# Patient Record
Sex: Female | Born: 1940 | Race: Black or African American | Hispanic: No | Marital: Single | Smoking: Former smoker
Health system: Southern US, Community
[De-identification: ages and names within clinical notes are randomized; demographics above are authoritative.]

## PROBLEM LIST (undated history)

## (undated) DIAGNOSIS — E785 Hyperlipidemia, unspecified: Secondary | ICD-10-CM

## (undated) DIAGNOSIS — R5383 Other fatigue: Secondary | ICD-10-CM

## (undated) DIAGNOSIS — I712 Thoracic aortic aneurysm, without rupture, unspecified: Secondary | ICD-10-CM

## (undated) DIAGNOSIS — E079 Disorder of thyroid, unspecified: Secondary | ICD-10-CM

## (undated) DIAGNOSIS — D693 Immune thrombocytopenic purpura: Secondary | ICD-10-CM

## (undated) DIAGNOSIS — D696 Thrombocytopenia, unspecified: Secondary | ICD-10-CM

## (undated) DIAGNOSIS — B370 Candidal stomatitis: Secondary | ICD-10-CM

## (undated) DIAGNOSIS — N289 Disorder of kidney and ureter, unspecified: Secondary | ICD-10-CM

## (undated) DIAGNOSIS — Z860100 Personal history of colon polyps, unspecified: Secondary | ICD-10-CM

## (undated) DIAGNOSIS — F419 Anxiety disorder, unspecified: Secondary | ICD-10-CM

## (undated) DIAGNOSIS — M179 Osteoarthritis of knee, unspecified: Secondary | ICD-10-CM

## (undated) DIAGNOSIS — R300 Dysuria: Secondary | ICD-10-CM

## (undated) DIAGNOSIS — M1710 Unilateral primary osteoarthritis, unspecified knee: Secondary | ICD-10-CM

## (undated) DIAGNOSIS — B029 Zoster without complications: Secondary | ICD-10-CM

## (undated) DIAGNOSIS — R011 Cardiac murmur, unspecified: Secondary | ICD-10-CM

## (undated) DIAGNOSIS — Z8639 Personal history of other endocrine, nutritional and metabolic disease: Secondary | ICD-10-CM

## (undated) DIAGNOSIS — M549 Dorsalgia, unspecified: Secondary | ICD-10-CM

## (undated) DIAGNOSIS — N390 Urinary tract infection, site not specified: Principal | ICD-10-CM

## (undated) DIAGNOSIS — I10 Essential (primary) hypertension: Secondary | ICD-10-CM

## (undated) DIAGNOSIS — E876 Hypokalemia: Principal | ICD-10-CM

## (undated) DIAGNOSIS — K219 Gastro-esophageal reflux disease without esophagitis: Secondary | ICD-10-CM

## (undated) HISTORY — DX: Thoracic aortic aneurysm, without rupture: I71.2

## (undated) HISTORY — DX: Immune thrombocytopenic purpura: D69.3

## (undated) HISTORY — DX: Hyperlipidemia, unspecified: E78.5

## (undated) HISTORY — DX: Anxiety disorder, unspecified: F41.9

## (undated) HISTORY — PX: APPENDECTOMY: SHX54

## (undated) HISTORY — DX: Hypokalemia: E87.6

## (undated) HISTORY — DX: Personal history of colon polyps, unspecified: Z86.0100

## (undated) HISTORY — DX: Hypercalcemia: E83.52

## (undated) HISTORY — DX: Dorsalgia, unspecified: M54.9

## (undated) HISTORY — DX: Personal history of colonic polyps: Z86.010

## (undated) HISTORY — DX: Candidal stomatitis: B37.0

## (undated) HISTORY — PX: ABDOMINAL HYSTERECTOMY: SHX81

## (undated) HISTORY — DX: Osteoarthritis of knee, unspecified: M17.9

## (undated) HISTORY — DX: Cardiac murmur, unspecified: R01.1

## (undated) HISTORY — DX: Urinary tract infection, site not specified: N39.0

## (undated) HISTORY — DX: Thoracic aortic aneurysm, without rupture, unspecified: I71.20

## (undated) HISTORY — DX: Thrombocytopenia, unspecified: D69.6

## (undated) HISTORY — PX: TOTAL KNEE ARTHROPLASTY: SHX125

## (undated) HISTORY — DX: Gastro-esophageal reflux disease without esophagitis: K21.9

## (undated) HISTORY — DX: Disorder of kidney and ureter, unspecified: N28.9

## (undated) HISTORY — DX: Dysuria: R30.0

## (undated) HISTORY — DX: Other fatigue: R53.83

## (undated) HISTORY — DX: Personal history of other endocrine, nutritional and metabolic disease: Z86.39

## (undated) HISTORY — DX: Disorder of thyroid, unspecified: E07.9

## (undated) HISTORY — DX: Unilateral primary osteoarthritis, unspecified knee: M17.10

---

## 1999-11-28 ENCOUNTER — Ambulatory Visit (HOSPITAL_COMMUNITY): Admission: RE | Admit: 1999-11-28 | Discharge: 1999-11-28 | Payer: Self-pay | Admitting: Family Medicine

## 1999-11-29 ENCOUNTER — Ambulatory Visit (HOSPITAL_COMMUNITY): Admission: RE | Admit: 1999-11-29 | Discharge: 1999-11-29 | Payer: Self-pay | Admitting: Family Medicine

## 2003-03-09 ENCOUNTER — Encounter: Payer: Self-pay | Admitting: Family Medicine

## 2003-03-09 ENCOUNTER — Ambulatory Visit (HOSPITAL_COMMUNITY): Admission: RE | Admit: 2003-03-09 | Discharge: 2003-03-09 | Payer: Self-pay | Admitting: Family Medicine

## 2003-08-01 ENCOUNTER — Encounter: Payer: Self-pay | Admitting: Occupational Therapy

## 2003-08-01 ENCOUNTER — Ambulatory Visit (HOSPITAL_COMMUNITY): Admission: RE | Admit: 2003-08-01 | Discharge: 2003-08-01 | Payer: Self-pay | Admitting: Family Medicine

## 2003-08-19 ENCOUNTER — Encounter: Payer: Self-pay | Admitting: Family Medicine

## 2003-08-19 ENCOUNTER — Encounter (INDEPENDENT_AMBULATORY_CARE_PROVIDER_SITE_OTHER): Payer: Self-pay | Admitting: *Deleted

## 2003-08-19 ENCOUNTER — Encounter: Admission: RE | Admit: 2003-08-19 | Discharge: 2003-08-19 | Payer: Self-pay | Admitting: Family Medicine

## 2003-10-07 ENCOUNTER — Encounter: Admission: RE | Admit: 2003-10-07 | Discharge: 2003-10-07 | Payer: Self-pay | Admitting: Family Medicine

## 2004-07-13 ENCOUNTER — Ambulatory Visit: Payer: Self-pay | Admitting: Family Medicine

## 2004-07-20 ENCOUNTER — Ambulatory Visit: Payer: Self-pay | Admitting: *Deleted

## 2004-07-26 ENCOUNTER — Ambulatory Visit: Payer: Self-pay | Admitting: *Deleted

## 2004-09-24 ENCOUNTER — Ambulatory Visit: Payer: Self-pay | Admitting: Family Medicine

## 2004-10-15 ENCOUNTER — Ambulatory Visit: Payer: Self-pay | Admitting: Family Medicine

## 2004-11-08 ENCOUNTER — Ambulatory Visit: Payer: Self-pay | Admitting: Family Medicine

## 2004-12-24 ENCOUNTER — Ambulatory Visit: Payer: Self-pay | Admitting: Family Medicine

## 2005-01-07 ENCOUNTER — Ambulatory Visit: Payer: Self-pay | Admitting: Family Medicine

## 2005-02-19 ENCOUNTER — Ambulatory Visit: Payer: Self-pay | Admitting: Family Medicine

## 2005-06-06 ENCOUNTER — Ambulatory Visit: Payer: Self-pay | Admitting: Family Medicine

## 2005-06-12 ENCOUNTER — Ambulatory Visit: Payer: Self-pay | Admitting: Family Medicine

## 2005-06-13 ENCOUNTER — Ambulatory Visit (HOSPITAL_COMMUNITY): Admission: RE | Admit: 2005-06-13 | Discharge: 2005-06-13 | Payer: Self-pay | Admitting: Family Medicine

## 2005-08-27 ENCOUNTER — Ambulatory Visit (HOSPITAL_COMMUNITY): Admission: RE | Admit: 2005-08-27 | Discharge: 2005-08-27 | Payer: Self-pay | Admitting: Nephrology

## 2005-08-27 ENCOUNTER — Encounter (INDEPENDENT_AMBULATORY_CARE_PROVIDER_SITE_OTHER): Payer: Self-pay | Admitting: Cardiology

## 2005-08-30 ENCOUNTER — Ambulatory Visit: Payer: Self-pay | Admitting: Family Medicine

## 2005-09-25 ENCOUNTER — Encounter (HOSPITAL_COMMUNITY): Admission: RE | Admit: 2005-09-25 | Discharge: 2005-12-24 | Payer: Self-pay | Admitting: Nephrology

## 2005-10-08 ENCOUNTER — Encounter: Admission: RE | Admit: 2005-10-08 | Discharge: 2005-10-08 | Payer: Self-pay | Admitting: Nephrology

## 2005-11-05 ENCOUNTER — Encounter: Admission: RE | Admit: 2005-11-05 | Discharge: 2005-11-05 | Payer: Self-pay | Admitting: Nephrology

## 2005-11-11 ENCOUNTER — Encounter (INDEPENDENT_AMBULATORY_CARE_PROVIDER_SITE_OTHER): Payer: Self-pay | Admitting: Family Medicine

## 2005-11-16 ENCOUNTER — Emergency Department (HOSPITAL_COMMUNITY): Admission: EM | Admit: 2005-11-16 | Discharge: 2005-11-16 | Payer: Self-pay | Admitting: Family Medicine

## 2005-11-17 ENCOUNTER — Emergency Department (HOSPITAL_COMMUNITY): Admission: EM | Admit: 2005-11-17 | Discharge: 2005-11-17 | Payer: Self-pay | Admitting: Emergency Medicine

## 2005-11-21 ENCOUNTER — Encounter: Admission: RE | Admit: 2005-11-21 | Discharge: 2005-11-21 | Payer: Self-pay | Admitting: Nephrology

## 2006-01-10 ENCOUNTER — Ambulatory Visit: Payer: Self-pay | Admitting: Internal Medicine

## 2006-01-24 ENCOUNTER — Ambulatory Visit: Payer: Self-pay | Admitting: Internal Medicine

## 2006-02-07 ENCOUNTER — Ambulatory Visit: Payer: Self-pay | Admitting: Internal Medicine

## 2006-03-18 ENCOUNTER — Ambulatory Visit: Payer: Self-pay | Admitting: Internal Medicine

## 2006-03-25 ENCOUNTER — Ambulatory Visit (HOSPITAL_COMMUNITY): Admission: RE | Admit: 2006-03-25 | Discharge: 2006-03-25 | Payer: Self-pay | Admitting: Internal Medicine

## 2006-04-18 ENCOUNTER — Ambulatory Visit: Payer: Self-pay | Admitting: Internal Medicine

## 2006-04-21 ENCOUNTER — Encounter: Admission: RE | Admit: 2006-04-21 | Discharge: 2006-04-21 | Payer: Self-pay | Admitting: Nephrology

## 2006-06-06 ENCOUNTER — Encounter: Admission: RE | Admit: 2006-06-06 | Discharge: 2006-06-06 | Payer: Self-pay | Admitting: Nephrology

## 2006-07-22 ENCOUNTER — Ambulatory Visit: Payer: Self-pay | Admitting: Gastroenterology

## 2006-08-07 ENCOUNTER — Ambulatory Visit: Payer: Self-pay | Admitting: Emergency Medicine

## 2006-08-18 ENCOUNTER — Ambulatory Visit: Payer: Self-pay | Admitting: Cardiovascular Disease

## 2006-08-26 ENCOUNTER — Ambulatory Visit: Payer: Self-pay | Admitting: Gastroenterology

## 2006-08-26 ENCOUNTER — Encounter (INDEPENDENT_AMBULATORY_CARE_PROVIDER_SITE_OTHER): Payer: Self-pay | Admitting: Gastroenterology

## 2006-09-01 ENCOUNTER — Ambulatory Visit: Payer: Self-pay | Admitting: Emergency Medicine

## 2006-09-03 ENCOUNTER — Ambulatory Visit: Payer: Self-pay | Admitting: Emergency Medicine

## 2006-11-25 ENCOUNTER — Encounter: Admission: RE | Admit: 2006-11-25 | Discharge: 2006-11-25 | Payer: Self-pay | Admitting: Nephrology

## 2006-12-16 ENCOUNTER — Encounter: Admission: RE | Admit: 2006-12-16 | Discharge: 2006-12-16 | Payer: Self-pay | Admitting: Endocrinology

## 2007-01-06 ENCOUNTER — Encounter: Admission: RE | Admit: 2007-01-06 | Discharge: 2007-01-06 | Payer: Self-pay | Admitting: Endocrinology

## 2007-02-17 ENCOUNTER — Ambulatory Visit: Payer: Self-pay | Admitting: Cardiology

## 2007-02-20 ENCOUNTER — Ambulatory Visit: Payer: Self-pay | Admitting: Emergency Medicine

## 2007-03-04 ENCOUNTER — Encounter: Admission: RE | Admit: 2007-03-04 | Discharge: 2007-03-04 | Payer: Self-pay | Admitting: Nephrology

## 2007-03-11 ENCOUNTER — Encounter: Admission: RE | Admit: 2007-03-11 | Discharge: 2007-03-11 | Payer: Self-pay | Admitting: Emergency Medicine

## 2007-03-11 ENCOUNTER — Encounter (INDEPENDENT_AMBULATORY_CARE_PROVIDER_SITE_OTHER): Payer: Self-pay | Admitting: *Deleted

## 2007-03-13 ENCOUNTER — Encounter: Admission: RE | Admit: 2007-03-13 | Discharge: 2007-03-13 | Payer: Self-pay | Admitting: Emergency Medicine

## 2007-03-13 ENCOUNTER — Encounter (INDEPENDENT_AMBULATORY_CARE_PROVIDER_SITE_OTHER): Payer: Self-pay | Admitting: Specialist

## 2007-03-25 ENCOUNTER — Ambulatory Visit: Payer: Self-pay | Admitting: Emergency Medicine

## 2007-04-20 ENCOUNTER — Ambulatory Visit: Payer: Self-pay | Admitting: Family Medicine

## 2007-06-09 ENCOUNTER — Ambulatory Visit: Payer: Self-pay | Admitting: Emergency Medicine

## 2007-07-06 ENCOUNTER — Telehealth (INDEPENDENT_AMBULATORY_CARE_PROVIDER_SITE_OTHER): Payer: Self-pay | Admitting: Family Medicine

## 2007-07-09 ENCOUNTER — Encounter (INDEPENDENT_AMBULATORY_CARE_PROVIDER_SITE_OTHER): Payer: Self-pay | Admitting: Family Medicine

## 2007-07-09 DIAGNOSIS — J309 Allergic rhinitis, unspecified: Secondary | ICD-10-CM | POA: Insufficient documentation

## 2007-07-09 DIAGNOSIS — M549 Dorsalgia, unspecified: Secondary | ICD-10-CM

## 2007-07-09 DIAGNOSIS — E039 Hypothyroidism, unspecified: Secondary | ICD-10-CM | POA: Insufficient documentation

## 2007-07-09 DIAGNOSIS — Z8679 Personal history of other diseases of the circulatory system: Secondary | ICD-10-CM | POA: Insufficient documentation

## 2007-07-09 DIAGNOSIS — IMO0002 Reserved for concepts with insufficient information to code with codable children: Secondary | ICD-10-CM | POA: Insufficient documentation

## 2007-07-09 DIAGNOSIS — J479 Bronchiectasis, uncomplicated: Secondary | ICD-10-CM

## 2007-07-09 DIAGNOSIS — E785 Hyperlipidemia, unspecified: Secondary | ICD-10-CM

## 2007-07-09 DIAGNOSIS — Z8639 Personal history of other endocrine, nutritional and metabolic disease: Secondary | ICD-10-CM | POA: Insufficient documentation

## 2007-07-09 DIAGNOSIS — N259 Disorder resulting from impaired renal tubular function, unspecified: Secondary | ICD-10-CM | POA: Insufficient documentation

## 2007-07-09 DIAGNOSIS — M1710 Unilateral primary osteoarthritis, unspecified knee: Secondary | ICD-10-CM

## 2007-07-09 DIAGNOSIS — I10 Essential (primary) hypertension: Secondary | ICD-10-CM

## 2007-07-09 DIAGNOSIS — Z862 Personal history of diseases of the blood and blood-forming organs and certain disorders involving the immune mechanism: Secondary | ICD-10-CM

## 2007-07-14 DIAGNOSIS — F411 Generalized anxiety disorder: Secondary | ICD-10-CM | POA: Insufficient documentation

## 2007-07-23 ENCOUNTER — Encounter (INDEPENDENT_AMBULATORY_CARE_PROVIDER_SITE_OTHER): Payer: Self-pay | Admitting: Family Medicine

## 2007-08-18 ENCOUNTER — Encounter (INDEPENDENT_AMBULATORY_CARE_PROVIDER_SITE_OTHER): Payer: Self-pay | Admitting: Family Medicine

## 2007-08-21 ENCOUNTER — Ambulatory Visit: Payer: Self-pay | Admitting: Cardiology

## 2007-09-01 ENCOUNTER — Encounter (INDEPENDENT_AMBULATORY_CARE_PROVIDER_SITE_OTHER): Payer: Self-pay | Admitting: Family Medicine

## 2007-09-02 DIAGNOSIS — K589 Irritable bowel syndrome without diarrhea: Secondary | ICD-10-CM

## 2007-09-02 DIAGNOSIS — K219 Gastro-esophageal reflux disease without esophagitis: Secondary | ICD-10-CM

## 2007-09-02 DIAGNOSIS — J984 Other disorders of lung: Secondary | ICD-10-CM | POA: Insufficient documentation

## 2007-10-12 HISTORY — PX: KNEE ARTHROPLASTY: SHX992

## 2007-10-13 ENCOUNTER — Emergency Department (HOSPITAL_COMMUNITY): Admission: EM | Admit: 2007-10-13 | Discharge: 2007-10-13 | Payer: Self-pay | Admitting: Emergency Medicine

## 2007-10-15 ENCOUNTER — Inpatient Hospital Stay (HOSPITAL_COMMUNITY): Admission: RE | Admit: 2007-10-15 | Discharge: 2007-10-19 | Payer: Self-pay | Admitting: Orthopedic Surgery

## 2007-12-10 ENCOUNTER — Encounter: Admission: RE | Admit: 2007-12-10 | Discharge: 2008-03-08 | Payer: Self-pay | Admitting: Orthopedic Surgery

## 2007-12-18 ENCOUNTER — Encounter: Payer: Self-pay | Admitting: Emergency Medicine

## 2008-02-22 ENCOUNTER — Encounter (INDEPENDENT_AMBULATORY_CARE_PROVIDER_SITE_OTHER): Payer: Self-pay | Admitting: Family Medicine

## 2008-09-12 ENCOUNTER — Ambulatory Visit (HOSPITAL_COMMUNITY): Admission: RE | Admit: 2008-09-12 | Discharge: 2008-09-12 | Payer: Self-pay | Admitting: Cardiovascular Disease

## 2008-12-02 ENCOUNTER — Encounter: Admission: RE | Admit: 2008-12-02 | Discharge: 2008-12-02 | Payer: Self-pay | Admitting: Sports Medicine

## 2008-12-13 ENCOUNTER — Encounter (INDEPENDENT_AMBULATORY_CARE_PROVIDER_SITE_OTHER): Payer: Self-pay | Admitting: Family Medicine

## 2008-12-18 DIAGNOSIS — Z8719 Personal history of other diseases of the digestive system: Secondary | ICD-10-CM

## 2008-12-20 ENCOUNTER — Encounter (INDEPENDENT_AMBULATORY_CARE_PROVIDER_SITE_OTHER): Payer: Self-pay | Admitting: Family Medicine

## 2008-12-20 ENCOUNTER — Inpatient Hospital Stay (HOSPITAL_COMMUNITY): Admission: EM | Admit: 2008-12-20 | Discharge: 2008-12-20 | Payer: Self-pay | Admitting: Emergency Medicine

## 2009-01-09 ENCOUNTER — Ambulatory Visit: Payer: Self-pay | Admitting: Family Medicine

## 2009-01-09 LAB — CONVERTED CEMR LAB
BUN: 19 mg/dL (ref 6–23)
Calcium: 9.9 mg/dL (ref 8.4–10.5)
Glucose, Bld: 79 mg/dL (ref 70–99)
Magnesium: 1.7 mg/dL (ref 1.5–2.5)

## 2009-01-29 ENCOUNTER — Encounter (INDEPENDENT_AMBULATORY_CARE_PROVIDER_SITE_OTHER): Payer: Self-pay | Admitting: Family Medicine

## 2009-03-08 ENCOUNTER — Encounter: Admission: RE | Admit: 2009-03-08 | Discharge: 2009-03-08 | Payer: Self-pay | Admitting: Endocrinology

## 2009-07-07 ENCOUNTER — Encounter: Payer: Self-pay | Admitting: Emergency Medicine

## 2009-08-01 ENCOUNTER — Encounter (INDEPENDENT_AMBULATORY_CARE_PROVIDER_SITE_OTHER): Payer: Self-pay | Admitting: *Deleted

## 2009-10-25 ENCOUNTER — Ambulatory Visit: Payer: Self-pay | Admitting: Physician Assistant

## 2009-10-25 LAB — CONVERTED CEMR LAB
ALT: 10 units/L (ref 0–35)
AST: 14 units/L (ref 0–37)
Creatinine, Ser: 1.08 mg/dL (ref 0.40–1.20)
Total Bilirubin: 0.4 mg/dL (ref 0.3–1.2)

## 2009-10-26 ENCOUNTER — Encounter: Payer: Self-pay | Admitting: Physician Assistant

## 2009-11-07 ENCOUNTER — Encounter: Payer: Self-pay | Admitting: Physician Assistant

## 2009-12-26 ENCOUNTER — Encounter: Payer: Self-pay | Admitting: Physician Assistant

## 2010-01-07 ENCOUNTER — Encounter: Payer: Self-pay | Admitting: Physician Assistant

## 2010-03-14 ENCOUNTER — Encounter: Payer: Self-pay | Admitting: Physician Assistant

## 2010-03-14 ENCOUNTER — Encounter: Payer: Self-pay | Admitting: Emergency Medicine

## 2010-03-14 LAB — CONVERTED CEMR LAB
ALT: 10 units/L
Albumin: 4 g/dL
Alkaline Phosphatase: 123 units/L
Basophils Relative: 1 %
CO2: 32 meq/L
Chloride: 99 meq/L
Creatinine, Ser: 1.06 mg/dL
HCT: 30.5 %
Hemoglobin: 10.2 g/dL
MCV: 75 fL
Neutrophils Relative %: 53 %
Platelets: 308 10*3/uL
Potassium: 2.8 meq/L
Total Protein: 6.9 g/dL
WBC: 6.4 10*3/uL

## 2010-03-22 ENCOUNTER — Ambulatory Visit: Payer: Self-pay | Admitting: Emergency Medicine

## 2010-03-26 ENCOUNTER — Ambulatory Visit: Payer: Self-pay | Admitting: Cardiovascular Disease

## 2010-04-02 ENCOUNTER — Encounter: Payer: Self-pay | Admitting: Physician Assistant

## 2010-04-02 DIAGNOSIS — D649 Anemia, unspecified: Secondary | ICD-10-CM

## 2010-04-05 ENCOUNTER — Encounter (INDEPENDENT_AMBULATORY_CARE_PROVIDER_SITE_OTHER): Payer: Self-pay | Admitting: *Deleted

## 2010-04-06 ENCOUNTER — Ambulatory Visit: Payer: Self-pay | Admitting: Emergency Medicine

## 2010-04-06 DIAGNOSIS — R05 Cough: Secondary | ICD-10-CM

## 2010-05-07 ENCOUNTER — Encounter (INDEPENDENT_AMBULATORY_CARE_PROVIDER_SITE_OTHER): Payer: Self-pay | Admitting: *Deleted

## 2010-06-04 ENCOUNTER — Encounter (INDEPENDENT_AMBULATORY_CARE_PROVIDER_SITE_OTHER): Payer: Self-pay | Admitting: *Deleted

## 2010-06-06 ENCOUNTER — Ambulatory Visit: Payer: Self-pay | Admitting: Gastroenterology

## 2010-06-13 ENCOUNTER — Ambulatory Visit: Payer: Self-pay | Admitting: Gastroenterology

## 2010-06-15 ENCOUNTER — Encounter: Payer: Self-pay | Admitting: Gastroenterology

## 2010-06-19 ENCOUNTER — Telehealth: Payer: Self-pay | Admitting: Gastroenterology

## 2010-06-19 ENCOUNTER — Encounter: Payer: Self-pay | Admitting: Physician Assistant

## 2010-06-19 DIAGNOSIS — Z860100 Personal history of colon polyps, unspecified: Secondary | ICD-10-CM | POA: Insufficient documentation

## 2010-10-02 ENCOUNTER — Encounter: Admission: RE | Admit: 2010-10-02 | Discharge: 2010-10-02 | Payer: Self-pay | Admitting: Internal Medicine

## 2010-12-02 ENCOUNTER — Encounter: Payer: Self-pay | Admitting: Family Medicine

## 2010-12-02 ENCOUNTER — Encounter: Payer: Self-pay | Admitting: Emergency Medicine

## 2010-12-02 ENCOUNTER — Encounter: Payer: Self-pay | Admitting: Endocrinology

## 2010-12-10 ENCOUNTER — Other Ambulatory Visit (HOSPITAL_COMMUNITY): Payer: Self-pay | Admitting: Cardiovascular Disease

## 2010-12-10 DIAGNOSIS — I729 Aneurysm of unspecified site: Secondary | ICD-10-CM

## 2010-12-11 NOTE — Miscellaneous (Signed)
Summary: Nephrology Notes   Rec'd notes from Dr. Marland Mcalpine office. She is anemic.  Labs indicate she is to see GI for eval soon. Make sure she has appointment with GI Have her come in for repeat CBC in 2 weeks with an anemia panel (Iron, TIBC, Ferritin, etc.) Tereso Newcomer PA-C  Apr 02, 2010 11:42 PM   Left message on answering machine for pt to call back.Marland KitchenMarland KitchenMarland KitchenArmenia Shannon  Apr 03, 2010 12:27 PM  Left message on answering machine for pt to call back.Marland KitchenMarland KitchenMarland KitchenArmenia Shannon  Apr 04, 2010 11:39 AM  Left message on answering machine for pt to call back....will mail letter.... [name]  Apr 05, 2010 11:10 AM   Clinical Lists Changes  Problems: Added new problem of ANEMIA (ICD-285.9) - Signed Assessed ANEMIA as comment only - Hg 10.2 with low MCV nephrologist referred to GI will get repeat labs with anemia panel  - Signed Observations: Added new observation of PO4: 2.7 mg/dL (85/46/2703 50:09) Added new observation of CALCIUM: 9.8 mg/dL (38/18/2993 71:69) Added new observation of ALBUMIN: 4.0 g/dL (67/89/3810 17:51) Added new observation of PROTEIN, TOT: 6.9 g/dL (02/58/5277 82:42) Added new observation of SGPT (ALT): 10 units/L (03/14/2010 23:35) Added new observation of SGOT (AST): 14 units/L (03/14/2010 23:35) Added new observation of ALK PHOS: 123 units/L (03/14/2010 23:35) Added new observation of BILI TOTAL: 0.2 mg/dL (35/36/1443 15:40) Added new observation of GFRAA: 62 mL/min (03/14/2010 23:35) Added new observation of GFR: 54 mL/min (03/14/2010 23:35) Added new observation of CREATININE: 1.06 mg/dL (08/67/6195 09:32) Added new observation of BUN: 18 mg/dL (67/10/4579 99:83) Added new observation of BG RANDOM: 78 mg/dL (38/25/0539 76:73) Added new observation of CO2 PLSM/SER: 32 meq/L (03/14/2010 23:35) Added new observation of CL SERUM: 99 meq/L (03/14/2010 23:35) Added new observation of K SERUM: 2.8 meq/L (03/14/2010 23:35) Added new observation of NA: 140 meq/L (03/14/2010  23:35) Added new observation of BASOPHIL %: 1 % (03/14/2010 23:35) Added new observation of % EOS AUTO: 2 % (03/14/2010 23:35) Added new observation of MONOCYTE %: 7 % (03/14/2010 23:35) Added new observation of LYMPHS %: 37 % (03/14/2010 23:35) Added new observation of PMN %: 53 % (03/14/2010 23:35) Added new observation of PLATELETK/UL: 308 K/uL (03/14/2010 23:35) Added new observation of RDW: 17.6 % (03/14/2010 23:35) Added new observation of MCV: 75 fL (03/14/2010 23:35) Added new observation of HCT: 30.5 % (03/14/2010 23:35) Added new observation of HGB: 10.2 g/dL (41/93/7902 40:97) Added new observation of RBC M/UL: 4.08 M/uL (03/14/2010 23:35) Added new observation of WBC COUNT: 6.4 10*3/microliter (03/14/2010 23:35)       Impression & Recommendations:  Problem # 1:  ANEMIA (ICD-285.9) Hg 10.2 with low MCV nephrologist referred to GI will get repeat labs with anemia panel  Complete Medication List: 1)  Paxil Cr 25 Mg Xr24h-tab (Paroxetine hcl) .... Take 1 tablet by mouth once a day (dr. Hyman Hopes) 2)  Adult Aspirin Low Strength 81 Mg Tbdp (Aspirin) .... Take 1 tablet by mouth once a day 3)  Lovastatin 40 Mg Tabs (Lovastatin) .... Take 1 tablet by mouth once a day 4)  Levothroid 112 Mcg Tabs (Levothyroxine sodium) .... Take 1 tablet by mouth once a day (dr. Talmage Nap) 5)  Claritin 10 Mg Tabs (Loratadine) .... Take 1 tablet by mouth once a day as needed for allergies 6)  Flonase 50 Mcg/act Susp (Fluticasone propionate) .Marland Kitchen.. 1 squirt qnostril bid 7)  Micardis Hct 80-12.5 Mg Tabs (Telmisartan-hctz) .... Take one tab by mouth daily 8)  Omeprazole 20 Mg  Cpdr (Omeprazole) .... Take one capsule by mouth every day ([facility]) 9)  Tylenol Extra Strength 500 Mg Tabs (Acetaminophen) .Marland Kitchen.. 1-2 by mouth three times a day as needed 10)  Senokot 8.6 Mg Tabs (Sennosides) .... As needed for constipation 11)  Calcium 600 600 Mg Tabs (Calcium carbonate) .... Two times a day 12)  Vitamin D 400 Unit  Caps (Cholecalciferol) .... Two times a day

## 2010-12-11 NOTE — Progress Notes (Signed)
Summary: triage   Phone Note Call from Patient Call back at Home Phone [phone]   Caller: Patient Call For: Dr Arlyce Dice Reason for Call: Talk to Nurse Summary of Call: Patient wants to speak to nurse because she had a procedure last week and has not had a bm yet. Initial call taken by: Tawni Levy,  June 19, 2010 10:48 AM  Follow-up for Phone Call        Had Colon 06-13-10. Pt. is passing gas, but no BM since procedure. Pt. is eating normally and drinking fluids. Denies abd.pain, bleeding, fever.   1) Continue eating/drinking as usual. follow routine actvity schedule. 2) May eat prunes as desired 3) If no BM by Thursday please call back.  4) For any pain, bleeding, fever, etc. call immediately.  Follow-up by: Laureen Ochs LPN,  June 19, 2010 11:02 AM     Appended Document: triage Pt. requested another coupon for Movi prep.  I mailed one to her.

## 2010-12-11 NOTE — Miscellaneous (Signed)
Summary: [facility] Previsit/prep  Clinical Lists Changes  Medications: Added new medication of MOVIPREP 100 GM  SOLR (PEG-KCL-NACL-NASULF-NA ASC-C) As per prep instructions. - Signed Rx of MOVIPREP 100 GM  SOLR (PEG-KCL-NACL-NASULF-NA ASC-C) As per prep instructions.;  #1 x 0;  Signed;  Entered by: Wyona Almas RN;  Authorized by: Louis Meckel MD;  Method used: Electronically to [facility] [address] [phone]*, [address] [place], [place], [place]  [address], Ph: [phone] or [phone], Fax: [phone] Allergies: Changed allergy or adverse reaction from CODEINE to CODEINE Changed allergy or adverse reaction from PCN to PCN Changed allergy or adverse reaction from ULTRAM to ULTRAM Changed allergy or adverse reaction from * SULFA to * SULFA    Prescriptions: MOVIPREP 100 GM  SOLR (PEG-KCL-NACL-NASULF-NA ASC-C) As per prep instructions.  #1 x 0   Entered by:   Wyona Almas RN   Authorized by:   Louis Meckel MD   Signed by:   Wyona Almas RN on 06/06/2010   Method used:   [facility]ronically to        [facility] [address] [phone]* (retail)       [address]       [place], [place]  [address]       Ph: [phone] or [phone]       Fax: [phone]   RxID:   [id number]

## 2010-12-11 NOTE — Assessment & Plan Note (Signed)
Summary: cough, bronchiectasis   Visit Type:  Follow-up Primary Provider/Referring Provider:  Tereso Newcomer, PA-C  CC:  Bronchiectasis.  Discuss CT chest.  The patient says her cough is better. Mucus production is less and mostly clear.Marland Kitchen  History of Present Illness: [name] is a 70 year old woman with a history of hypertension, hypercholesterolemia, hypothyroidism, allergic rhinitis, and renal insufficiency followed by Dr. Elvis Coil. I saw her in 2007-8 for pulmonary nodules, mediastinal LAD and basilar bronchiectasis on CT scan. Bx of an axillary node showed no evidence for sarcoidosis.   Re-establishing care 03/22/10 -- Had an URI in Feb-March 2011, also some allergies. Used claritin, currently off as her symptoms improved. Still feeling a bit fatigued - being followed by Dr Hyman Hopes for anemia and hypokalemia, planning to GI eval for anemia. Reports continued globus sensation. Clears her throat and prod of clear to yellow. Has seen a streak of red in it before. No real cough,  just throat clearing.   ROV 04/06/10 -- returns to discuss her bronchiectasis and cough. We started claritin last time, didn't need the flonase. Cough is better, globus sensation is better but not gone completely. Seems to be resolving. Reviewed CT scan of the chest - no change in bronchiectasis.   cc: [name], [name]  Current Medications (verified): 1)  Paxil Cr 25 Mg Xr24h-Tab (Paroxetine Hcl) .... Take 1 Tablet By Mouth Once A Day (Dr. Hyman Hopes) 2)  Adult Aspirin Low Strength 81 Mg  Tbdp (Aspirin) .... Take 1 Tablet By Mouth Once A Day 3)  Lovastatin 40 Mg  Tabs (Lovastatin) .... Take 1 Tablet By Mouth Once A Day 4)  Levothroid 100 Mcg Tabs (Levothyroxine Sodium) .Marland Kitchen.. 1 By Mouth Daily 5)  Claritin 10 Mg  Tabs (Loratadine) .... Take 1 Tablet By Mouth Once A Day As Needed For Allergies 6)  Flonase 50 Mcg/act  Susp (Fluticasone Propionate) .Marland Kitchen.. 1 Squirt Qnostril Bid 7)  Micardis Hct 80-12.5 Mg Tabs  (Telmisartan-Hctz) .... Take One Tab By Mouth Daily 8)  Omeprazole 20 Mg Cpdr (Omeprazole) .... Take One Capsule By Mouth Every Day ([facility]) 9)  Tylenol 325 Mg Tabs (Acetaminophen) .Marland Kitchen.. 1-2 By Mouth Three Times A Day As Needed 10)  Senokot 8.6 Mg Tabs (Sennosides) .... As Needed For Constipation 11)  Calcium 600 600 Mg Tabs (Calcium Carbonate) .... Two Times A Day 12)  Vitamin D 400 Unit Caps (Cholecalciferol) .... Two Times A Day  Allergies (verified): 1)  ! Codeine 2)  ! Pcn 3)  ! Ultram 4)  ! Nasacort Aq (Triamcinolone Acetonide(Nasal)) 5)  ! * Sulfa  Vital Signs:  Patient profile:   70 year old female Height:      66 inches (167.64 cm) Weight:      195 pounds (88.64 kg) BMI:     31.59 O2 Sat:      95 % on Room air Temp:     98.5 degrees F (36.94 degrees C) oral Pulse rate:   89 / minute BP sitting:   126 / 70  (right arm) Cuff size:   regular  Vitals Entered By: Michel Bickers CMA (Apr 06, 2010 11:26 AM)  O2 Sat at Rest %:  95 O2 Flow:  Room air CC: Bronchiectasis.  Discuss CT chest.  The patient says her cough is better. Mucus production is less and mostly clear. Comments Medications reviewed. Daytime phone verified. Michel Bickers CMA  Apr 06, 2010 11:32 AM   Physical Exam  General:  normal appearance  and healthy appearing.   Head:  normocephalic and atraumatic Eyes:  conjunctiva and sclera clear Nose:  no deformity, discharge, inflammation, or lesions Mouth:  no deformity or lesions Neck:  no masses, thyromegaly, or abnormal cervical nodes Lungs:  clear bilaterally to auscultation and percussion Heart:  regular rate and rhythm, S1, S2 without murmurs, rubs, gallops, or clicks Abdomen:  not examined Msk:  no deformity or scoliosis noted with normal posture Extremities:  no clubbing, cyanosis, edema, or deformity noted Neurologic:  non-focal Skin:  intact without lesions or rashes Psych:  alert and cooperative; normal mood and affect; normal attention span and  concentration   CT of Chest  Procedure date:  03/26/2010  Findings:      Comparison: CT 09/12/2008.   Findings: Calcification of the left coronary artery including possibly the left main segment.  Extensive calcification of the thoracic aortic wall.  The aortic ectasia is stable .  Stable focal contour bulge of the descending thoracic aorta, at the level of the left pulmonary artery (image 20).   Mild bronchiectasis of the segmental bronchi of the right and left lower lobes, mainly on the left.  This appears stable.  This may be due to the traction bronchiectasis as there appears to be some scarring in the left lower lobe as well.  Stable 3 mm subpleural nodule in the right upper lobe (image 14).  Stable 5 mm left upper lobe nodule (image 18).  Stable small nodular like density in the subpleural aspect the right lower lobe (image 39)   IMPRESSION: CAD.  Stable small nodules.  Mild bronchiectasis of the lower lobes.  Impression & Recommendations:  Problem # 1:  BRONCHIECTASIS (ICD-494.0) Stable by CT scan. Will not rescan unless she has a clinical change - ROV 1 yr or as needed   Problem # 2:  COUGH (ICD-786.2)  Improved w rx of allergies with claritin. She didn't need the nasal steroid, never used it.  - continue the claritin thru June, then change to as needed.   Orders: Est. Patient Level III ([address])  Medications Added to Medication List This Visit: 1)  Levothroid 100 Mcg Tabs (Levothyroxine sodium) .Marland Kitchen.. 1 by mouth daily 2)  Tylenol 325 Mg Tabs (Acetaminophen) .Marland Kitchen.. 1-2 by mouth three times a day as needed  Patient Instructions: 1)  Your CT scan of the chest is stable.  2)  Continue your claritin until July, then you can start using it as needed.  3)  Follow up with Dr Delton Coombes in 1 year or if you develop any problems.

## 2010-12-11 NOTE — Procedures (Signed)
Summary: Colonoscopy  Patient: [name] Note: All result statuses are Final unless otherwise noted.  Tests: (1) Colonoscopy (COL)   COL Colonoscopy           DONE     [facility]     [address]     [place], [place]  [address]           COLONOSCOPY PROCEDURE REPORT           PATIENT:  [name], Gould  MR#:  [id number]     BIRTHDATE:  [birth year], 68 yrs. old  GENDER:  female           ENDOSCOPIST:  Barbette Hair. Arlyce Dice, MD     Referred by:  Beverley Fiedler, M.D.     Elvis Coil, M.D.           PROCEDURE DATE:  06/13/2010     PROCEDURE:  Colonoscopy with snare polypectomy     ASA CLASS:  Class III     INDICATIONS:  1) screening  2) history of pre-cancerous     (adenomatous) colon polyps           MEDICATIONS:   Fentanyl 75 mcg IV, Versed 9 mg IV, Benadryl 25 mg     IV           DESCRIPTION OF PROCEDURE:   After the risks benefits and     alternatives of the procedure were thoroughly explained, informed     consent was obtained.  Digital rectal exam was performed and     revealed no abnormalities.   The LB [id number] endoscope     was introduced through the anus and advanced to the cecum, which     was identified by both the appendix and ileocecal valve, without     limitations.  The quality of the prep was excellent, using     MoviPrep.  The instrument was then slowly withdrawn as the colon     was fully examined.     <<PROCEDUREIMAGES>>           FINDINGS:  A sessile polyp was found in the descending colon. It     was 3 mm in size. Polyp was snared without cautery. Retrieval was     successful (see image1). snare polyp  Diverticula were found in     the sigmoid colon. Few diverticula  This was otherwise a normal     examination of the colon (see image2, image3, image4, image5,     image7, image9, image10, and image11).   Retroflexed views in the     rectum revealed no abnormalities.    The time to cecum =  5.75     minutes. The scope was then withdrawn  (time =  6.0  min) from the     patient and the procedure completed.           COMPLICATIONS:  None           ENDOSCOPIC IMPRESSION:     1) 3 mm sessile polyp     2) Diverticula in the sigmoid colon     3) Otherwise normal examination     RECOMMENDATIONS:     1) If the polyp(s) removed today are proven to be adenomatous     (pre-cancerous) polyps, you will need a repeat colonoscopy in 5     years. Otherwise you should continue to follow colorectal cancer     screening guidelines for "routine risk" patients with colonoscopy  in 10 years.           REPEAT EXAM:   You will receive a letter from Dr. Arlyce Dice in 1-2     weeks, after reviewing the final pathology, with followup     recommendations.           ______________________________     Barbette Hair Arlyce Dice, MD           CC:           n.     eSIGNED:   Barbette Hair. Kaplan at 06/13/2010 09:41 AM           [name], [phone]  Note: An exclamation mark (!) indicates a result that was not dispersed into the flowsheet. Document Creation Date: 06/13/2010 9:42 AM _______________________________________________________________________  (1) Order result status: Final Collection or observation date-time: 06/13/2010 09:34 Requested date-time:  Receipt date-time:  Reported date-time:  Referring Physician:   Ordering Physician: Melvia Heaps [phone]) Specimen Source:  Source: Launa Grill Order Number: [id number] Lab site:   Appended Document: Colonoscopy     Procedures Next Due Date:    Colonoscopy: 06/2015

## 2010-12-11 NOTE — Miscellaneous (Signed)
   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  h/o pulmonary nodules (followed by Dr. Delton Coombes) h/o normal LVF, trace MR and trace AI by echo (followed by Dr[facility] Allyson Sabal with [facility])  Thoracic Aortic Aneurysm (3.8 cm in 09/2009; followed by Dr. 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  h/o pulmonary nodules (followed by Dr. Delton Coombes) h/o normal LVF, trace MR and trace AI by echo (followed [facility] Sabal with [facility])  Thoracic Aortic Aneurysm (3.8 cm in 09/2009; followed by Dr. Allyson Sabal) Myoview 09/2008: nonischemic

## 2010-12-11 NOTE — Assessment & Plan Note (Signed)
Summary: bronchiectasis, allergies   Visit Type:  Initial Consult Primary Provider/Referring Provider:  Tereso Newcomer, PA-C  CC:  Patient here for follow-up for abnormnal CT and bronchiectasis. The patient was last seen by Dr. Delton Coombes on 06/09/2007. .The patient c/o cough with occasional discolored mucus...sob...fatigue.Marland Kitchen  History of Present Illness: Ms. Sonier is a 70 year old woman with a history of hypertension, hypercholesterolemia, hypothyroidism, allergic rhinitis, and renal insufficiency followed by Dr. Elvis Coil. I saw her in 2007-8 for pulmonary nodules, mediastinal LAD and basilar bronchiectasis on CT scan. Bx of an axillary node showed no evidence for sarcoidosis.   Re-establishing care 03/22/10 -- Had an URI in Feb-March 2011, also some allergies. Used claritin, currently off as her symptoms improved. Still feeling a bit fatigued - being followed by Dr Hyman Hopes for anemia and hypokalemia, planning to GI eval for anemia. Reports continued globus sensation. Clears her throat and prod of clear to yellow. Has seen a streak of red in it before. No real cough,  just throat clearing.     Preventive Screening-Counseling & Management  Alcohol-Tobacco     Alcohol drinks/day: 0     Smoking Status: quit     Year Quit: 1978     Pack years: 1 ppd x10 years  Current Medications (verified): 1)  Paxil Cr 25 Mg Xr24h-Tab (Paroxetine Hcl) .... Take 1 Tablet By Mouth Once A Day (Dr. Hyman Hopes) 2)  Adult Aspirin Low Strength 81 Mg  Tbdp (Aspirin) .... Take 1 Tablet By Mouth Once A Day 3)  Lovastatin 40 Mg  Tabs (Lovastatin) .... Take 1 Tablet By Mouth Once A Day 4)  Levothroid 112 Mcg Tabs (Levothyroxine Sodium) .... Take 1 Tablet By Mouth Once A Day (Dr. Talmage Nap) 5)  Claritin 10 Mg  Tabs (Loratadine) .... Take 1 Tablet By Mouth Once A Day As Needed For Allergies 6)  Flonase 50 Mcg/act  Susp (Fluticasone Propionate) .Marland Kitchen.. 1 Squirt Qnostril Bid 7)  Micardis Hct 80-12.5 Mg Tabs (Telmisartan-Hctz) .... Take One  Tab By Mouth Daily 8)  Omeprazole 20 Mg Cpdr (Omeprazole) .... Take One Capsule By Mouth Every Day ([facility]) 9)  Tylenol Extra Strength 500 Mg Tabs (Acetaminophen) .Marland Kitchen.. 1-2 By Mouth Three Times A Day As Needed 10)  Senokot 8.6 Mg Tabs (Sennosides) .... As Needed For Constipation 11)  Calcium 600 600 Mg Tabs (Calcium Carbonate) .... Two Times A Day 12)  Vitamin D 400 Unit Caps (Cholecalciferol) .... Two Times A Day  Allergies: 1)  ! Codeine 2)  ! Pcn 3)  ! Ultram 4)  ! Nasacort Aq (Triamcinolone Acetonide(Nasal)) 5)  ! * Sulfa  Past History:  Past Surgical History: Hysterectomy(total) b/c fibroids Cardiac echo 9/07 Renal u/s 9/06 DEXA 10/07/03 EGD/COLONOSCOPY 2008 Total Knee Arthroplasty--right knee--10/2007  Social History: Alcohol drinks/day:  0 Pack years:  1 ppd x10 years  Vital Signs:  Patient profile:   70 year old female Height:      66 inches (167.64 cm) Weight:      195 pounds (88.64 kg) O2 Sat:      94 % on Room air Temp:     97.7 degrees F (36.50 degrees C) oral Pulse rate:   93 / minute BP sitting:   130 / 72  (left arm) Cuff size:   regular  Vitals Entered By: Michel Bickers CMA (Mar 22, 2010 9:57 AM)  O2 Sat at Rest %:  94 O2 Flow:  Room air CC: Patient here for follow-up for abnormnal CT and bronchiectasis. The patient  was last seen by Dr. Delton Coombes on 06/09/2007. .The patient c/o cough with occasional discolored mucus...sob...fatigue.   Physical Exam  General:  normal appearance and healthy appearing.   Head:  normocephalic and atraumatic Eyes:  conjunctiva and sclera clear Nose:  no deformity, discharge, inflammation, or lesions Mouth:  no deformity or lesions Neck:  no masses, thyromegaly, or abnormal cervical nodes Lungs:  clear bilaterally to auscultation and percussion Heart:  regular rate and rhythm, S1, S2 without murmurs, rubs, gallops, or clicks Abdomen:  not examined Msk:  no deformity or scoliosis noted with normal posture Extremities:   no clubbing, cyanosis, edema, or deformity noted Neurologic:  non-focal Skin:  intact without lesions or rashes Psych:  alert and cooperative; normal mood and affect; normal attention span and concentration   Impression & Recommendations:  Problem # 1:  BRONCHIECTASIS (ICD-494.0) With associated nodular disease, has been stable on CT thru 2008.  - repeat CT scan, high res cuts - rov to review  Problem # 2:  ALLERGIC RHINITIS (ICD-477.9) Still with UA irritation and throat clearing, no real cough.  - restart claritin and flonase through the Spring, then try going back to as needed   Medications Added to Medication List This Visit: 1)  Tylenol Extra Strength 500 Mg Tabs (Acetaminophen) .Marland Kitchen.. 1-2 by mouth three times a day as needed  Other Orders: Consultation Level IV ([address]) Radiology Referral (Radiology)  Patient Instructions: 1)  Please restart your claritin 10mg  once daily until July 2)  Please start flonase 2 sprays each nostril once daily until July 3)  We will perform a CT scan of your chest to compare with you prior studies.  4)  Follow up with Dr Delton Coombes after your CT scan to review the study together.

## 2010-12-11 NOTE — Letter (Signed)
Summary: *HSN Results Follow up  [facility]  [address] [place], [place] [address]   Phone: [phone]  Fax: [phone]      04/05/2010   [name] [address] [place], [place]  [address]   Dear  Ms. [name],                            ____S.Drinkard,FNP   ____D. Gore,FNP       ____B. McPherson,MD   ____V. Rankins,MD    ____E. Mulberry,MD    ____N. Daphine Deutscher, FNP  ____D. Reche Dixon, MD    ____K. Philipp Deputy, MD    ____Other     This letter is to inform you that your recent test(s):  _______Pap Smear    _______Lab Test     _______X-ray    _______ is within acceptable limits  _______ requires a medication change  _______ requires a follow-up lab visit  _______ requires a follow-up visit with your provider   Comments:  We have been trying to contact you.  Please give the office a call at your earliest convenience.       _________________________________________________________ If you have any questions, please contact our office                     Sincerely,  [name] [facility]

## 2010-12-11 NOTE — Letter (Signed)
Summary: REQUESTING RECORDS FROM DR.BALAN  REQUESTING RECORDS FROM DR.BALAN   Imported By: Arta Bruce 01/08/2010 16:46:53  _____________________________________________________________________  External Attachment:    Type:   Image     Comment:   External Document

## 2010-12-11 NOTE — Letter (Signed)
Summary: Previsit letter  [facility]  [address] [place], [place] [address]   Phone: [phone]  Fax: [phone]       05/07/2010 MRN: [id number]  [name] [address] [place], [place]  [address]  Dear Ms. Bry,  Welcome to the Gastroenterology Division at [facility].    You are scheduled to see a nurse for your pre-procedure visit on  06-06-10 at 10am on the 3rd floor at [facility], [address]  We ask that you try to arrive at our office 15 minutes prior to your appointment time to allow for check-in.  Your nurse visit will consist of discussing your medical and surgical history, your immediate family medical history, and your medications.    Please bring a complete list of all your medications or, if you prefer, bring the medication bottles and we will list them.  We will need to be aware of both prescribed and over the counter drugs.  We will need to know exact dosage information as well.  If you are on blood thinners (Coumadin, Plavix, Aggrenox, Ticlid, etc.) please call our office today/prior to your appointment, as we need to consult with your physician about holding your medication.   Please be prepared to read and sign documents such as consent forms, a financial agreement, and acknowledgement forms.  If necessary, and with your consent, a friend or relative is welcome to sit-in on the nurse visit with you.  Please bring your insurance card so that we may make a copy of it.  If your insurance requires a referral to see a specialist, please bring your referral form from your primary care physician.  No co-pay is required for this nurse visit.     If you cannot keep your appointment, please call [phone] to cancel or reschedule prior to your appointment date.  This allows [place] the opportunity to schedule an appointment for another patient in need of care.    Thank you for choosing [facility] for your medical  needs.  We appreciate the opportunity to care for you.  Please visit [place] at our website  to learn more about our practice.                     Sincerely.                                                                                                                   The Gastroenterology Division

## 2010-12-11 NOTE — Letter (Signed)
Summary: [facility] Instructions  [facility]  [address] [place], [place] [address]   Phone: [phone]  Fax: [phone]       [name]    February 07, 1941    MRN: [id number]        Procedure Day /Date:  06/13/10  Wednesday     Arrival Time:  8:00am     Procedure Time:  9:00am     Location of Procedure:                    _x _  [facility] (4th Floor)   PREPARATION FOR COLONOSCOPY WITH MOVIPREP   Starting 5 days prior to your procedure _7/29/11 _ do not eat nuts, seeds, popcorn, corn, beans, peas,  salads, or any raw vegetables.  Do not take any fiber supplements (e.g. Metamucil, Citrucel, and Benefiber).  THE DAY BEFORE YOUR PROCEDURE         DATE:   06/12/10   DAY:  Tuesday  1.  Drink clear liquids the entire day-NO SOLID FOOD  2.  Do not drink anything colored red or purple.  Avoid juices with pulp.  No orange juice.  3.  Drink at least 64 oz. (8 glasses) of fluid/clear liquids during the day to prevent dehydration and help the prep work efficiently.  CLEAR LIQUIDS INCLUDE: Water Jello Ice Popsicles Tea (sugar ok, no milk/cream) Powdered fruit flavored drinks Coffee (sugar ok, no milk/cream) Gatorade Juice: apple, white grape, white cranberry  Lemonade Clear bullion, consomm, broth Carbonated beverages (any kind) Strained chicken noodle soup Hard Candy                             4.  In the morning, mix first dose of MoviPrep solution:    Empty 1 Pouch A and 1 Pouch B into the disposable container    Add lukewarm drinking water to the top line of the container. Mix to dissolve    Refrigerate (mixed solution should be used within 24 hrs)  5.  Begin drinking the prep at 5:00 p.m. The MoviPrep container is divided by 4 marks.   Every 15 minutes drink the solution down to the next mark (approximately 8 oz) until the full liter is complete.   6.  Follow completed prep with 16 oz of clear liquid of your choice (Nothing red or  purple).  Continue to drink clear liquids until bedtime.  7.  Before going to bed, mix second dose of MoviPrep solution:    Empty 1 Pouch A and 1 Pouch B into the disposable container    Add lukewarm drinking water to the top line of the container. Mix to dissolve    Refrigerate  THE DAY OF YOUR PROCEDURE      DATE:  06/13/10  DAY:  Wednesday  Beginning at  4:00 a.m. (5 hours before procedure):         1. Every 15 minutes, drink the solution down to the next mark (approx 8 oz) until the full liter is complete.  2. Follow completed prep with 16 oz. of clear liquid of your choice.    3. You may drink clear liquids until   7:00am (2 HOURS BEFORE PROCEDURE).   MEDICATION INSTRUCTIONS  Unless otherwise instructed, you should take regular prescription medications with a small sip of water   as early as possible the morning of your procedure.   Additional medication instructions: Hold Micardis/HCT  and Klor-con the morning of procedure.         OTHER INSTRUCTIONS  You will need a responsible adult at least 71 years of age to accompany you and drive you home.   This person must remain in the waiting room during your procedure.  Wear loose fitting clothing that is easily removed.  Leave jewelry and other valuables at home.  However, you may wish to bring a book to read or  an iPod/MP3 player to listen to music as you wait for your procedure to start.  Remove all body piercing jewelry and leave at home.  Total time from sign-in until discharge is approximately 2-3 hours.  You should go home directly after your procedure and rest.  You can resume normal activities the  day after your procedure.  The day of your procedure you should not:   Drive   Make legal decisions   Operate machinery   Drink alcohol   Return to work  You will receive specific instructions about eating, activities and medications before you leave.    The above instructions have been reviewed and  explained to me by   Wyona Almas RN  June 06, 2010 10:44 AM     I fully understand and can verbalize these instructions _____________________________ Date _________

## 2010-12-11 NOTE — Miscellaneous (Signed)
   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  h/o pulmonary nodules (followed by Dr. Delton Coombes) h/o normal LVF, trace MR and trace AI by echo (followed [facility] Allyson Sabal with [facility])  Thoracic Aortic Aneurysm (3.8 cm in 09/2009; followed by Dr. Allyson Sabal) Myoview 09/2008: nonischemic Colonic polyps, hx of   a.  colonoscopy 8.2011 (tubular adenoma) . . . 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  h/o pulmonary nodules (followed by Dr. Delton Coombes) h/o normal LVF, trace MR and trace AI by echo (followed by Dr. Allyson Sabal with [facility])  Thoracic Aortic Aneurysm (3.8 cm in 09/2009; followed by Dr. Allyson Sabal) Myoview 09/2008: nonischemic Colonic polyps, hx of   a.  colonoscopy 8.2011 (tubular adenoma) . . . repeat 2016

## 2010-12-11 NOTE — Letter (Signed)
Summary: Patient Notice- Polyp Results  [facility]  [address] [place], [place] [address]   Phone: [phone]  Fax: [phone]        June 15, 2010 MRN: [id number]    DESHAUNA CAYSON [address] [place], [place]  [address]    Dear Ms. Feliz,  I am pleased to inform you that the colon polyp(s) removed during your recent colonoscopy was (were) found to be benign (no cancer detected) upon pathologic examination.  I recommend you have a repeat colonoscopy examination in 5_ years to look for recurrent polyps, as having colon polyps increases your risk for having recurrent polyps or even colon cancer in the future.  Should you develop new or worsening symptoms of abdominal pain, bowel habit changes or bleeding from the rectum or bowels, please schedule an evaluation with either your primary care physician or with me.  Additional information/recommendations:  __ No further action with gastroenterology is needed at this time. Please      follow-up with your primary care physician for your other healthcare      needs.  __ Please call [phone] to schedule a return visit to review your      situation.  __ Please keep your follow-up visit as already scheduled.  _x_ Continue treatment plan as outlined the day of your exam.  Please call us if you are having persistent problems or have questions about your condition that have not been fully answered at this time.  Sincerely,  Louis Meckel MD  This letter has been electronically signed by your physician.  Appended Document: Patient Notice- Polyp Results letter mailed

## 2010-12-11 NOTE — Letter (Signed)
Summary: CARDIOLOGY NOTES  CARDIOLOGY NOTES   Imported By: Arta Bruce 03/07/2010 12:46:08  _____________________________________________________________________  External Attachment:    Type:   Image     Comment:   External Document

## 2010-12-11 NOTE — Letter (Signed)
Summary: [name] MD  [name] MD   Imported By: Lester Bethany 03/28/2010 09:41:05  _____________________________________________________________________  External Attachment:    Type:   Image     Comment:   External Document

## 2010-12-14 NOTE — Letter (Signed)
Summary: NEPHROLOGY NOTES//[name]  NEPHROLOGY NOTES//[name]   Imported By: Arta Bruce 04/03/2010 10:36:39  _____________________________________________________________________  External Attachment:    Type:   Image     Comment:   External Document

## 2010-12-19 ENCOUNTER — Ambulatory Visit (HOSPITAL_COMMUNITY)
Admission: RE | Admit: 2010-12-19 | Discharge: 2010-12-19 | Disposition: A | Discharge status: Home / Self Care | Payer: Medicare Other | Source: Ambulatory Visit | Attending: Cardiovascular Disease | Admitting: Cardiovascular Disease

## 2010-12-19 ENCOUNTER — Encounter (HOSPITAL_COMMUNITY): Payer: Self-pay

## 2010-12-19 DIAGNOSIS — R0602 Shortness of breath: Secondary | ICD-10-CM | POA: Insufficient documentation

## 2010-12-19 DIAGNOSIS — I517 Cardiomegaly: Secondary | ICD-10-CM | POA: Insufficient documentation

## 2010-12-19 DIAGNOSIS — I729 Aneurysm of unspecified site: Secondary | ICD-10-CM

## 2010-12-19 DIAGNOSIS — I712 Thoracic aortic aneurysm, without rupture, unspecified: Secondary | ICD-10-CM | POA: Insufficient documentation

## 2010-12-19 DIAGNOSIS — I2510 Atherosclerotic heart disease of native coronary artery without angina pectoris: Secondary | ICD-10-CM | POA: Insufficient documentation

## 2010-12-19 DIAGNOSIS — R059 Cough, unspecified: Secondary | ICD-10-CM | POA: Insufficient documentation

## 2010-12-19 DIAGNOSIS — R05 Cough: Secondary | ICD-10-CM | POA: Insufficient documentation

## 2010-12-19 HISTORY — DX: Essential (primary) hypertension: I10

## 2010-12-19 MED ORDER — IOHEXOL 300 MG/ML  SOLN: 100.0000 mL | Freq: Once | INTRAMUSCULAR | Status: AC | PRN

## 2011-02-25 ENCOUNTER — Other Ambulatory Visit: Payer: Self-pay | Admitting: Emergency Medicine

## 2011-02-26 LAB — CLOSTRIDIUM DIFFICILE EIA: C difficile Toxins A+B, EIA: NEGATIVE

## 2011-02-26 LAB — BASIC METABOLIC PANEL
BUN: 8 mg/dL (ref 6–23)
CO2: 24 mEq/L (ref 19–32)
CO2: 26 mEq/L (ref 19–32)
Calcium: 9.2 mg/dL (ref 8.4–10.5)
Chloride: 104 mEq/L (ref 96–112)
Chloride: 105 mEq/L (ref 96–112)
Creatinine, Ser: 0.97 mg/dL (ref 0.4–1.2)
GFR calc Af Amer: >60 mL/min (ref >60)
GFR calc Af Amer: >60 mL/min (ref >60)
GFR calc Af Amer: >60 mL/min (ref >60)
GFR calc non Af Amer: 57 mL/min — ABNORMAL LOW (ref >60)
Glucose, Bld: 86 mg/dL (ref 70–99)
Potassium: 3.8 mEq/L (ref 3.5–5.1)
Sodium: 137 mEq/L (ref 135–145)

## 2011-02-26 LAB — DIFFERENTIAL
Basophils Absolute: 0 10*3/uL (ref 0.0–0.1)
Eosinophils Relative: 0 % (ref 0–5)
Lymphocytes Relative: 7 % — ABNORMAL LOW (ref 12–46)
Lymphs Abs: 0.5 10*3/uL — ABNORMAL LOW (ref 0.7–4.0)
Monocytes Absolute: 0.1 10*3/uL (ref 0.1–1.0)
Neutro Abs: 7.5 10*3/uL (ref 1.7–7.7)

## 2011-02-26 LAB — CBC
HCT: 36.4 % (ref 36.0–46.0)
MCHC: 33.7 g/dL (ref 30.0–36.0)
MCV: 77.7 fL — ABNORMAL LOW (ref 78.0–100.0)
Platelets: 216 10*3/uL (ref 150–400)
Platelets: 322 10*3/uL (ref 150–400)
RBC: 3.71 MIL/uL — ABNORMAL LOW (ref 3.87–5.11)
RDW: 18 % — ABNORMAL HIGH (ref 11.5–15.5)
WBC: 4.5 10*3/uL (ref 4.0–10.5)
WBC: 8.2 10*3/uL (ref 4.0–10.5)

## 2011-02-26 LAB — URINALYSIS, ROUTINE W REFLEX MICROSCOPIC
Bilirubin Urine: NEGATIVE
Glucose, UA: NEGATIVE mg/dL
Ketones, ur: NEGATIVE mg/dL
Leukocytes, UA: NEGATIVE
Nitrite: NEGATIVE
Specific Gravity, Urine: 1.025 (ref 1.005–1.030)
pH: 6 (ref 5.0–8.0)

## 2011-02-26 LAB — MAGNESIUM: Magnesium: 1.6 mg/dL (ref 1.5–2.5)

## 2011-02-26 LAB — COMPREHENSIVE METABOLIC PANEL
AST: 27 U/L (ref 0–37)
Albumin: 3.9 g/dL (ref 3.5–5.2)
BUN: 23 mg/dL (ref 6–23)
Calcium: 10.5 mg/dL (ref 8.4–10.5)
Chloride: 104 mEq/L (ref 96–112)
Creatinine, Ser: 1.08 mg/dL (ref 0.4–1.2)
GFR calc Af Amer: >60 mL/min (ref >60)
Total Protein: 8 g/dL (ref 6.0–8.3)

## 2011-02-26 LAB — PHOSPHORUS
Phosphorus: 2.4 mg/dL (ref 2.3–4.6)
Phosphorus: 2.5 mg/dL (ref 2.3–4.6)

## 2011-02-26 LAB — FECAL LACTOFERRIN, QUANT

## 2011-03-14 ENCOUNTER — Ambulatory Visit: Payer: Medicare Other | Attending: Physical Medicine and Rehabilitation

## 2011-03-14 DIAGNOSIS — R0789 Other chest pain: Secondary | ICD-10-CM | POA: Insufficient documentation

## 2011-03-14 DIAGNOSIS — R109 Unspecified abdominal pain: Secondary | ICD-10-CM | POA: Insufficient documentation

## 2011-03-14 DIAGNOSIS — IMO0001 Reserved for inherently not codable concepts without codable children: Secondary | ICD-10-CM | POA: Insufficient documentation

## 2011-03-14 DIAGNOSIS — M546 Pain in thoracic spine: Secondary | ICD-10-CM | POA: Insufficient documentation

## 2011-03-14 DIAGNOSIS — R5381 Other malaise: Secondary | ICD-10-CM | POA: Insufficient documentation

## 2011-03-21 ENCOUNTER — Ambulatory Visit: Payer: Medicare Other | Admitting: Physical Therapy

## 2011-03-24 ENCOUNTER — Other Ambulatory Visit: Payer: Self-pay | Admitting: Emergency Medicine

## 2011-03-26 NOTE — Assessment & Plan Note (Signed)
[facility]                             PULMONARY OFFICE NOTE   MACKINZE, CRIADO                        MRN:          [id number]  DATE:03/25/2007                            DOB:          02-Jul-1941    SUBJECTIVE:  Ms Scobee is a 70 year old woman whom I have followed since  September for abnormal CT scan of the chest and mild exertional dyspnea.  Her CT has shown some very mild scattered nodular disease of unclear  significance and some lower-lobe bronchiectasis.  At her last visit,  there was also note made of some axillary lymphadenopathy.  Since that  time, she has had needle biopsies performed, and the pathology was  benign.  She denies dyspnea, cough.  She has had some gastroesophageal  reflux disease symptoms, and these are improved.  Her cough improved  with the resolution of her gastroesophageal reflux disease.  Her  breathing is doing well.  She denies any exertional symptoms.  She has  had a repeat CT scan of her chest performed, and we are here to review  the results of that today.   CURRENT MEDICATIONS:  1. Diltiazem 240 mg daily.  2. Paxil 25 mg daily.  3. Aspirin 81 mg daily.  4. Lovastatin 40 mg daily.  5. Potassium chloride 20 mEq daily.  6. Levothyroxine 0.1 mg daily.  7. Diovan 80 mg daily.  8. Pantoprazole 40 mg daily.   PHYSICAL EXAMINATION:  This is a pleasant obese woman who is in no  distress on room air.  Her weight is 187 pounds up 5 pounds from her  last visit.  Temperature 98.1, blood pressure 134/80, heart rate 64,  respiratory rate __________ , 98% on room air.  HEENT EXAMINATION:  Benign.  NECK:  No stridor.  LUNGS:  Clear to auscultation bilaterally.  There is no wheezing on her  forced expiration.  ABDOMEN:  Soft, nontender, nondistended with positive bowel sounds.  EXTREMITIES:  No clubbing, cyanosis or edema.   IMPRESSION:  1. Mild bronchiectasis and small nodular disease on CT without any      evidence  of active granulomatous disease or sarcoidosis.  2. Reactive axillary lymphadenopathy which was benign on biopsy.   PLAN:  Repeat her CT scan of the chest in October 2008 for surveillance.  She will follow up with me at that time or sooner if she has problems in  the interim.     Leslye Peer, MD     RSB/MedQ  DD: 03/26/2007  DT: 03/26/2007  Job #: [id number]   cc:   Dorisann Frames, M.D.  [name], M.D.  Velora Heckler, MD  [facility]

## 2011-03-26 NOTE — Op Note (Signed)
NAME:  [name], [name]               ACCOUNT NO.:  [id number]   MEDICAL RECORD NO.:  [id number]          PATIENT TYPE:  INP   LOCATION:  5025                         FACILITY:  [facility]   PHYSICIAN:  Loreta Ave, M.D. DATE OF BIRTH:  Jul 22, 1941   DATE OF PROCEDURE:  10/15/2007  DATE OF DISCHARGE:                               OPERATIVE REPORT   PREOPERATIVE DIAGNOSIS:  Right knee end stage degenerative arthritis  with marked bony erosions and cyst in the proximal tibia and distal  femur on the medial side, varus alignment.   POSTOPERATIVE DIAGNOSIS:  Right knee end stage degenerative arthritis  with marked bony erosions and cyst in the proximal tibia and distal  femur on the medial side, varus alignment.   PROCEDURE:  Right total knee replacement, Stryker Triathlon prosthesis,  soft tissue balancing medial capsule release, cancellus grafting of the  defects in the femur and tibia, additional 4 screw and washer fixation  proximal tibia for the rim around the cyst.  Cemented pegged posterior  stabilized #3 femoral component, cemented #4 tibial component with a 75  mm centralizing stem, 9 mm polyethylene posterior stabilized insert,  cemented resurfacing 35 mm medial offset patellar component.   SURGEON:  Loreta Ave, M.D.   ASSISTANT:  Genene Churn. Barry Dienes, P.A.-C., present throughout the entire case.   ANESTHESIA:  General.   BLOOD LOSS:  Minimal.   TOURNIQUET TIME:  1 hour 45 minutes.   SPECIMENS:  None.   CULTURES:  None.   COMPLICATIONS:  None.   DRESSING:  Soft compressive with the knee immobilizer.   DRAINS:  Hemovac x1.   PROCEDURE IN DETAIL:  The patient was brought to the operating room,  placed on operating table in supine position.  After adequate anesthesia  had been obtained, right knee examined.  Pretty much was full extension,  flexion to 110 degrees, varus alignment more than 7 degrees not very  correctable.  Tourniquet applied.  Prepped and draped usual  sterile  fashion.  Exsanguinated with elevation Esmarch, tourniquet placed to 350  mmHg.  Straight incision above the patella down to the tibial tubercle.  Medial arthrotomy vastus splitting for minimally invasive approach  preserving quad tendon.  Knee exposed.  Remnants of menisci, cruciate  ligaments, periarticular spurs, and loose bodies removed.  Distal femur  exposed.  Intramedullary guide placed.  Distal cut set at 6 degrees of  valgus matching a mechanical axis resecting 10 mm.  Using the  epicondylar axis, size, cut and fitted for a #3 peg posterior stabilized  component.  Extramedullary guide on the tibia.  0 degrees cut.  Resected  just below the medial defect.  When this was complete, the tibia was  exposed.  Significant cystic changes in about half the medial tibial  plateau going down about 1 cm.  All of the soft tissue capsular remnants  were cleared out, bone below penetrated so that it could be packed with  cancellous bone.  This left a little bit of a thin shell at the anterior  aspect but still relatively good stability.  Trials put in  place.  #3 on  the femur, #4 on the tibia, with a 9 mm insert. I had full extension,  full flexion, rotation was set.  I then prepared the tibia with reaming  for a stem.  Punched for the keel of the tibia and with this, a little  insufficiency and a small crack in the front of the tibia adjacent to  the cystic area.  The vast majority still intact.  I put the keel in  place down the tibia and then put one screw across this cortical shell  anteromedially just to stabilize this.  Defects were all packed with  cancellous bone.  I made sure that the definitive component would fit  and not interfere with the screw that had been placed.  I felt I still  had very good stability and bone stock.  Patella was exposed, posterior  10 mm removed, sized, drilled and fitted for 35 mm component.  Good  patellofemoral tracking at completion.  I had to do  a medial capsular  release in order to balance the knee for the 9 mm insert on the tibia.  When this was all complete, all trials were then removed.  Copious  irrigation throughout.  Cement prepared and placed on all components  which were firmly seated.  Cancellous bone had been packed in the  defects in the femur and tibia prior to cementing of components.  Polyethylene attached to the tibia and knee reduced.  Clamp held on the  patella until the cement hardened.  Once the cement was hardened, the  knee was re-examined.  Pleased with alignment, stability, and  patellofemoral tracking.  Full extension, full flexion.  Wound  irrigated.  Hemovac placed.  Arthrotomy closed with #1 Vicryl.  Skin and  subcutaneous tissue with Vicryl and staples.  Hemovac was clamped after  knee injected with Marcaine.  Sterile compressive dressing applied.  Tourniquet deflated and removed.  Knee immobilizer applied.  Anesthesia  reversed.  Brought to the recovery room.  Tolerated surgery well.  No  complications.      Loreta Ave, M.D.  Electronically Signed     DFM/MEDQ  D:  10/15/2007  T:  10/15/2007  Job:  [id number]

## 2011-03-26 NOTE — H&P (Signed)
NAME:  [name], [name]               ACCOUNT NO.:  [id number]   MEDICAL RECORD NO.:  [id number]          PATIENT TYPE:  OBV   LOCATION:  5011                         FACILITY:  [facility]   PHYSICIAN:  Charlestine Massed, MDDATE OF BIRTH:  1941/10/15   DATE OF ADMISSION:  12/18/2008  DATE OF DISCHARGE:                              HISTORY & PHYSICAL   CHIEF COMPLAINT:  Recurrent nausea, vomiting, and diarrhea since today  morning.   HISTORY OF PRESENT ILLNESS:  [name] is a 70 year old female  with a prior history of hypothyroidism, mild chronic renal  insufficiency, hypertension, who came to the emergency room today  because she started getting diarrhea as well as nausea and vomiting  since today morning.  She states that the day before yesterday, she ate  a hamburger, and yesterday she took some salad with cheese in it.  As  per the ER physician, the diarrhea was foul-smelling, but there was no  obvious blood seen in the stools.  She has been having nausea and  vomiting since yesterday morning.  She was able to hold some soda now  after getting Zofran, but until then, she could not hold anything down.  She did not see any blood in the vomitus.  The diarrhea, she states, was  watery.  No blood or mucosa was noticeably seen in the stools.  It is  foul-smelling.  Patient denies any fevers or chills.  No cough.  No  sputum production.  No abdominal pain.  No chest pain.   Patient has baseline shortness of breath.  She states that she can walk  only up to one block, then she gets short of breath.  She states that  previously she was told that she has a leak in a valve.  She follows up  with [facility], Dr. Allyson Sabal, at [facility]  [facility].  She was told that she has a leak in 2 of her valves, but  she does not know exactly where it is at.   Right now, she has shortness of breath at 1 block.  No chest pain.  No  palpitations.  No fever.  No loss of  consciousness.  No syncope.   PAST MEDICAL HISTORY:  1. Mild chronic renal insufficiency.  2. Hypothyroidism.  3. Hypertension.  4. Dyslipidemia.  5. Gastroesophageal reflux disease.  6. Some murmur in the heart.   SURGICAL HISTORY:  Total knee replacement, right side.   SOCIAL HISTORY:  No smoking, no alcohol intake, no drug intake.  No drug  use.   ALLERGIES:  She is allergic to PENICILLIN, CODEINE, and SULFA DRUGS.  With the SULFA, she gets mouth-swelling.  With the PENICILLIN, she gets  nausea and vomiting.   PHYSICAL EXAMINATION:  VITAL SIGNS:  Blood pressure 128/102, heart rate  96 per minute, respirations 20 per minute, temperature 98.8, O2 sat 98%  on room air.  GENERAL:  Patient is awake and alert, oriented.  Speaks clearly.  She is  slightly fatigued and afebrile.  HEAD/NECK:  Pupils reactive to light.  No JVD.  No bruits.  Oral mucosa  does not show any bleed.  There is no oral thrush.  Tongue is moist.  CHEST:  Bilateral air entry is good.  No rales or wheeze.  CARDIAC:  S1 and S2 are heard.  There is a faint diastolic murmur in the  aortic area, early diastolic murmur.  The P2 is slightly accentuated.  ABDOMEN:  Soft, nontender.  No organomegaly.  Bowel sounds are  hypodynamic with a gurgling nature.  No costovertebral angle tenderness.  EXTREMITIES:  No pedal edema.  No tenderness, swelling, or edema in the  calf.  CNS:  No obvious motor or sensory deficits.  Comprehension and speech  are intact.  PSYCH:  She looks slightly depressed.  She has a flat affect.  MUSCULOSKELETAL:  Full range of motion in all joints present.   LABS:  CBC:  WBC 8.2, hemoglobin 12.3, hematocrit 36.4, platelets 322,  neutrophils 91%.  BNP:  Sodium 139, potassium 3.4, chloride 104, bicarb  26, glucose 108, BUN 23, creatinine 1.08.  GFR is more than 60.  LFTs:  Bilirubin 1, alk phos 130, ALT 27, AST 18, total protein 8, albumin 3.9,  calcium 10.5.  UA is mildly positive for blood but is  negative for  urinary tract infection.   CT, abdomen and pelvis with IV and p.o. contrast shows no affliction.  Primary mucosal folds of the proximal jejunum may indicate  gastroenteritis.  Gallstones present.  Right renal cyst present.  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   Chest x-ray, stable cardiomegaly, no active lung fields.  Scattered  air/fluid levels, probable ileus.  CT abdomen done after that has  revealed that there is no evidence of any bowel obstruction.   Stool studies are pending.   ASSESSMENT/PLAN:  1. Acute gastroenteritis, possibly secondary to food poisoning.  Even      though the stool appears foul-smelling and possibly could be      bacterial, will check the stool for WBCs.  I am not giving any      antibiotics right now, as patient does not have any fever, and if      it tends to be Salmonella or Escherichia coli, then antibiotic      usage can lead to complications, so hold off on antibiotic therapy      for now.  IV fluids, normal saline for fluid hydration and Zofran      for nausea.  I am not giving any antispasmodic medications of      Imodium.  For right now, will continue giving IV fluids to support      the fluid loss.  2. Hypokalemia:  Will give IV potassium loaded in the IV normal saline      drip.  Will recheck a BMP and magnesium in the a.m.  3. Prior history of hypertension:  Will continue Diltiazem.  We will      discontinue the Micardis/hydrochlorothiazide combination but will      continue her on Micardis 80 mg by itself for the blood pressure.  4. Chronic renal insufficiency:  Will watch the electrolytes.  Patient      follows up with Dr. Hyman Hopes as an outpatient.  Can continue outpatient      followup.  5. Hypothyroidism:  Continue Thyroxin 0.2 mcg daily.  Will check TSH      levels.  6. Deep venous thrombosis prophylaxis with Lovenox.  7. Gastrointestinal prophylaxis with Protonix.   A Total of 90 minutes spent on this admission and  discussion.     Charlestine Massed, MD  Electronically Signed    UT/MEDQ  D:  12/19/2008  T:  12/19/2008  Job:  [id number]

## 2011-03-26 NOTE — Discharge Summary (Signed)
NAME:  [name], [name] NO.:  [id number]   MEDICAL RECORD NO.:  [id number]          PATIENT TYPE:  INP   LOCATION:  5025                         FACILITY:  [facility]   PHYSICIAN:  Loreta Ave, M.D. DATE OF BIRTH:  February 27, 1941   DATE OF ADMISSION:  10/15/2007  DATE OF DISCHARGE:  10/13/2007                               DISCHARGE SUMMARY   FINAL DIAGNOSES:  1. Status post right total knee replacement for end-stage degenerative      joint disease.  2. Postoperative blood loss anemia.  3. Mild renal insufficiency.  4. Hypothyroidism.  5. Dyslipidemia.  6. Hypertension.  7. Heart murmur.  8. Gastroesophageal reflux disease.   HISTORY OF PRESENT ILLNESS:  A 70 year old black female with a history  of end-stage DJD, right knee and chronic pain presented to our office  for a preop evaluation for total knee replacement.  She had  progressively worsening pain with failed response with conservative  treatment.   HOSPITAL COURSE:  On October 15, 2007, the patient was taken to the  [facility] OR and a right total knee replacement procedure was  performed.  Surgeon Loreta Ave, MD and assistant Zonia Kief, PAC.  Anesthesia:  General.  EBL:  Minimal.  Target time,  2 hours.  One  Hemovac drain placed.  There were no surgical or anesthesia  complications and the patient was transferred to recovery in stable  condition.  October 16, 2007:  The patient was doing well with good pain  control.  Vital signs were stable, afebrile.  Hemoglobin 8.8, hematocrit  26.3, potassium 3.3, sodium 137, chloride 101, CO2 26, BUN 10,  creatinine 1.06, glucose 131, INR 1.1.  Dressing was clean, dry and  intact.  Started FeSO4 325 mg p.o. b.i.d.  Pharmacy protocol Coumadin  started.  October 17, 2007:  The patient doing well.  Good pain control.  Wound looks good, staples intact.  No signs of infection.  Hemovac drain  removed.  Awaiting skilled nursing placement.  The patient  requested  [facility].  Sodium 135, potassium 3.1, chloride 101, CO2 29, BUN 8,  creatinine 1.12, glucose 97, hemoglobin 8.4, hematocrit 25.7.  October 18, 2007:  The patient is doing well.  Felt that she had some dizziness  when she was up with PT.  Vital signs stable, afebrile.  WBC 5.7,  hemoglobin 8.0, hematocrit 24.2, platelets 224, sodium 139, potassium  3.4, chloride 102, CO2 31, BUN 8, creatinine 1.18, glucose 98.  The  wound looks good.  Staples intact.  No drainage or signs of infection.  Calves nontender.  Neurovascularly intact.  Transfused 2 units of packed-  red-blood cells today.  Discontinued Foley.  October 19, 2007:  The  patient was doing well with good pain control.  Progressing with PT.  Safe that she is ready for transfer to [facility].  She feels much better after a transfusion yesterday.  Temperature 98.4,  pulse 72, respirations 20, blood pressure 157/84.  WBC 4.9, hemoglobin  9.5, hematocrit 28.3, platelets 258, INR 2.3.  The wound looks good.  Staples are intact.  No drainage or signs of infection.  Calves are  nontender.  Neurovascularly intact.   DISPOSITION:  Transfer to [facility].   CONDITION:  Good and stable.   DISCHARGE MEDICATIONS:  1. Diltiazem 240 mg p.o. daily.  2. Paxil 25 mg p.o. daily.  3. Lovastatin 40 mg p.o. daily.  4. Synthroid 100 mcg p.o. daily.  5. Avapro 150 mg p.o. daily.  6. Protonix 40 mg p.o. daily.  7. Coumadin pharmacy protocol.  Maintain an INR of 2 to 3 x4 weeks      post-op for DVT prophylaxis.  8. Colace 100 mg p.o. b.i.d.  9. FeSO4 325 mg p.o. b.i.d. with meals.  10.Percocet 5/325 one to two tabs p.o. every four to six hours p.r.n.      for pain.  11.Robaxin 500 mg 1 tab p.o. every six hours p.r.n. for spasms.   DISPOSITION:  The patient will work with physical therapy to improve  knee range of motion, strengthening and ambulation.  Daily dressing  changes with 4 x 4 gauze and  tape.  She is okay to shower but no tub  soaking.  She is to have weightbearing as tolerated.  She will remain on  Coumadin x4 weeks postoperative deep vein thrombosis prophylaxis.  She  can now progress from a walker to a cane as tolerated.  Knee staples to  be removed two weeks postoperative.  She will follow up in the office in  two weeks postoperative for recheck with Dr. Eulah Pont.  Notify us  immediately if there are any questions or concerns before that time.      Loreta Ave, M.D.  Electronically Signed     DFM/MEDQ  D:  10/19/2007  T:  10/19/2007  Job:  [id number]

## 2011-03-26 NOTE — Assessment & Plan Note (Signed)
[facility]                             PULMONARY OFFICE NOTE   [name], [name]                        MRN:          [id number]  DATE:06/09/2007                            DOB:          05-22-1941    [name] is a 70 year old woman with a history of an abnormal CT scan  of the chest characterized by some left greater than right lower lobe  bronchiectasis and some very small scattered pulmonary nodular disease.  She also had some mediastinal lymphadenopathy on her original evaluation  back in September, 2007.  For that reason she underwent a needle biopsy  which was benign and showed no evidence of sarcoidosis.  She returns  today telling me that she has plans to undergo a right total knee  replacement. This is to be done by Dr. Eulah Pont in orthopedics.  She needs  pulmonary clearance to undergo the surgery.  We reviewed her exertional  tolerance today.  She is able to do her usual activities without any  limitation. She can walk indefinitely and is limited only by her right  knee discomfort.  She can vacuum, sweep and do her other household  chores without becoming short of breath.  She denies any cough,  wheezing, or new pulmonary symptoms.   MEDICATIONS:  1. Diltiazem 240 mg daily.  2. Paxil-CR 25 mg daily.  3. Aspirin 81 mg daily.  4. Lovastatin 40 mg daily.  5. Potassium chloride 20 mEq daily.  6. Levothyroxine 0.1 mg daily.  7. Diovan 80 mg daily.  8. Pantoprazole 40 mg daily.   PHYSICAL EXAMINATION:  GENERAL:  This is a comfortable, overweight  African-American woman who is in no distress on room air. Her weight is  187 pounds, temperature 97.9, blood pressure 146/100 which is higher  than her usual baseline, pulse 72, SPO2 98% on room air.  HEENT EXAM:  There are no oral lesions.  NECK:  Has no stridor or lymphadenopathy.  LUNGS:  Clear to auscultation bilaterally.  HEART:  Regular rate and rhythm without murmur.  ABDOMEN:  Obese,  soft, nontender with positive bowel sounds.  EXTREMITIES:  No clubbing, cyanosis or edema.   IMPRESSION:  1. Mild bronchiectasis and micro-nodular disease on CT scan of the      chest which we have been following expectantly with serial CT      scans. We do not have any evidence to date of sarcoidosis or other      active pulmonary process.  2. Osteoarthritis of the knees, especially the right knee. Planning to      undergo a right total knee replacement.  I do not see any pulmonary      contraindication for this procedure and I believe that she can      proceed whenever Dr. Eulah Pont believes she is appropriate.  I will of      course be available in the perioperative and postoperative period      if she should have any difficulties from a pulmonary stand-point.   PLAN:  I will follow up with a CT scan  for [name] in October, 2008  and review the results afterwards.  I do not have any plans for any  diagnostic studies at this time.  If her followup CT shows progression  of her bronchiectasis or nodular disease then she may require fiberoptic  bronchoscopy to look for atypical infection including possible atypical  mycobacterial disease.  I do not believe that any of these issues should  impact her upcoming plans for surgery.     Leslye Peer, MD  Electronically Signed    RSB/MedQ  DD: 06/09/2007  DT: 06/09/2007  Job #: [id number]   cc:   Loreta Ave, M.D.  Dorisann Frames, M.D.  [name], M.D.  Velora Heckler, MD  [facility]

## 2011-03-26 NOTE — Discharge Summary (Signed)
NAME:  [name], [name]               ACCOUNT NO.:  [id number]   MEDICAL RECORD NO.:  [id number]          PATIENT TYPE:  OBV   LOCATION:  5011                         FACILITY:  [facility]   PHYSICIAN:  Peggye Pitt, M.D. DATE OF BIRTH:  1941-02-03   DATE OF ADMISSION:  12/19/2008  DATE OF DISCHARGE:  12/20/2008                               DISCHARGE SUMMARY   DISCHARGE DIAGNOSES:  1. Acute gastroenteritis, likely viral.  2. Hypokalemia.  3. Hypomagnesemia.  4. Mild chronic renal insufficiency.  5. Hypothyroidism.  6. Hypertension.  7. Hyperlipidemia.  8. Gastroesophageal reflux disease.   DISCHARGE MEDICATIONS:  1. Phenergan 12.5 mg 1 tablet every 6 hours as needed for nausea.  2. Magnesium oxide 400 mg 3 times a day for 10 days.  3. Vicodin 5/500 mg 1 tablet every 4 hours as needed for pain.  4. Prilosec 20 mg daily.  5. Lovastatin 40 mg daily.  6. Synthroid 112 mcg daily.  7. Diltiazem CD 360 mg daily.  8. Paroxetine 25 mg daily.  9. Micardis/HCTZ 80/12.5 mg daily.   DISPOSITION AND FOLLOWUP:  The patient is discharged home in stable  condition.  Her nausea and vomiting have resolved.  She has tolerated a  semisolid diet.  Her potassium has been repleted.  She will follow up  with her PCP, Dr. Luciana Axe at [facility].  I have urged her to make an  appointment within the next 2 weeks.   CONSULTATION THIS HOSPITALIZATION:  None.   IMAGES AND PROCEDURES PERFORMED DURING THIS HOSPITALIZATION:  1. An abdominal x-ray on December 18, 2008, that shows a stable      cardiomegaly with no active lung disease, scattered air-fluid      levels, probable ileus but cannot exclude early partial SBO with no      free air.  2. A CT scan of the abdomen and pelvis on December 18, 2008, that      showed no obstruction.  Prominent mucosal folds of the proximal      jejunum indicating gastroenteritis.  Gallstones.  A right renal      cyst.  Degenerative disk disease at L4-L5 with a slight  anterolisthesis present.   HISTORY AND PHYSICAL EXAMINATION:  For full details, please refer to  history and physical dictated on December 19, 2008, by Dr. Berkley Harvey, but in  brief Ms. Manni is a pleasant 70 year old African American woman who  presented to the emergency department with complaints of diarrhea and  vomiting since the morning of admission.  Apparently, she had had a  hamburger and thinks that it may have been the source.  Because she was  unable to keep down any p.o.'s, we were asked to admit her for further  evaluation and management.   HOSPITAL COURSE:  1. Acute gastroenteritis.  Given its self-contained pretty quickly,      differential includes viral gastroenteritis versus food poisoning.      At the time of discharge, she is no longer vomiting, no longer      having diarrhea and is tolerating a semisolid diet.  2. For her hypokalemia, she has  been repleted.  3. For her hypomagnesemia with a magnesium level of 1.6, she will be      sent home on mag oxide 400 mg 3 times a day.  4. Rest of chronic medical issues were not a problem this      hospitalization.   DISCHARGE VITAL SIGNS:  Blood pressure 141/78, heart rate 78,  respirations 20, and O2 sats 96% on room air with a temp of 98.2.   DISCHARGE LABORATORY DATA:  Sodium 138, potassium 3.9, chloride 109,  bicarb 26, BUN 8, creatinine 0.97, and glucose of 93.  WBCs 4.5,  hemoglobin 9.8, platelets of 216, and mag of 1.6.  She has had 1 C. diff  that is negative.      Peggye Pitt, M.D.  Electronically Signed     EH/MEDQ  D:  12/20/2008  T:  12/21/2008  Job:  [id number]   cc:   Fanny Dance. Rankins, M.D.

## 2011-03-29 NOTE — Assessment & Plan Note (Signed)
[facility]                               PULMONARY OFFICE NOTE   [name], [name]                        MRN:          [id number]  DATE:08/07/2006                            DOB:          Mar 04, 1941    REASON FOR CONSULTATION:  I am asked by Dr. Elvis Coil to evaluate Ms.  Gould for abnormal CT scan of the chest in the setting of hypercalcemia and  mild dyspnea on exertion.   HISTORY:  [name] is a 70 year old woman with a history of hypertension,  hypercholesterolemia, hypothyroidism, allergic rhinitis, and mild renal  insufficiency.  She has been evaluated by Dr. Elvis Coil, who is following  her kidney function.  It was noted that she has mild hypercalcemia.  Part of  her evaluation over the last several months has included a CT scan of the  abdomen in December 2006 to evaluate abdominal discomfort.  Repeat scans  have been performed to follow some abdominal nodules.  Cuts of her lung  bases on her most recent abdominal scan in June 2007 showed some nodular  densities at her left lung base.  This in the setting of hypercalcemia  prompted concern for possible sarcoidosis.  [name] denies shortness of  breath at her baseline but she does have some mild dyspnea when she lifts  heavy objects or climbs stairs.  She does not have to stop to rest on these  occasions.  She has had two discrete episodes that she can recall when she  had severe exertional shortness of breath.  These happened in March and May  2007.  She did not have any associated chest pain on these occasions.  The  episodes resolved quickly with rest.  She does have postnasal drip that  drains to the back of her throat.  She has used Nasonex and Claritin in the  past during the spring allergy season.  She does have throat clearing and  nocturnal cough.  She denies any rashes or nodules.  Dr. Hyman Hopes has followed  her nodules with a CT scan of the chest done in July 2007 as  detailed below.  An ACE level has also been obtained but I do not have the results of this at  this time.   PAST MEDICAL HISTORY:  1. Hypertension.  2. Hypercholesterolemia.  3. Allergic rhinitis.  4. Mild renal insufficiency.  5. Hypothyroidism.  6. GERD.  7. Irritable bowel syndrome.  8. Status post appendectomy as a child.   ALLERGIES:  CODEINE and PENICILLIN.   CURRENT MEDICATIONS:  1. Triamterene/hydrochlorothiazide 70/50 mg daily.  2. Diltiazem 240 mg daily.  3. Paxil CR 25 mg daily.  4. Aspirin 81 mg daily.  5. Lovastatin 40 mg daily.  6. Levothyroxine 0.088 mg daily.   SOCIAL HISTORY:  The patient is single.  She lives alone.  She is a former  smoker with approximately nine pack-year total history.  She quit 30 years  ago.  She does not use any other drugs or alcohol.  She is currently retired  but used to work in  maintenance and custodial work.  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  She had an uncle who had TB as  a small girl.  She does not know of any other tuberculosis exposures in her  past.  She denies any recent travel.   FAMILY HISTORY:  Significant for COPD, allergies, asthma, coronary artery  disease, rheumatoid disease, and cancer in multiple members of her family.   REVIEW OF SYSTEMS:  As per the HPI.  Also, please note that she has had some  episodes of irregular heartbeat, acid indigestion, weight gain,  anxiety/depression, and joint stiffness.   EXAMINATION:  GENERAL:  This is a pleasant woman who is in no distress and  interacts appropriately.  VITAL SIGNS:  Her weight is 167 pounds, temperature 97.9, blood pressure  160/80, heart rate 64, SPO2 99% on room air.  LUNGS:  Clear to auscultation bilaterally without any wheezes or rhonchi.  HEART:  Has a regular rate and rhythm without murmur, rub or gallop.  ABDOMEN:  Soft, nontender, nondistended, with positive bowel sounds and no  hepatosplenomegaly.  EXTREMITIES:  Have no  cyanosis, clubbing or edema.  NEUROLOGIC:  She has a nonfocal exam.   STUDIES:  Abdominal CT in December 2006 done for abdominal pain showed some  scattered abdominal nodules and lymphadenopathy with nodular densities noted  at both bases, more prominently on the left.  CT scan of the chest from July  2007 showed mild axillary lymphadenopathy, no mediastinal lymph nodes,  resolution of the largest left lower lobe nodule noted on her previous CT,  and stable smaller left lower lobe nodules with some mild pleural thickening  near both major fissures.   IMPRESSION:  Hypercalcemia in the setting of stable/resolving left lower  lobe pulmonary nodules without any lymphadenopathy.  Sarcoidosis is a  possibility but at this time the etiology of her hypercalcemia and her  pulmonary nodularity is not clear.   PLANS:  1. Full pulmonary function testing.  2. I will perform a CT scan of the chest with high resolution cuts in      October 2007.  If this shows persistent or enlarging left lower lobe      nodular disease, then it may be prudent to discuss biopsy, even in an      asymptomatic patient.  Granulomatous disease can typically be diagnosed      with transbronchial biopsies by bronchoscopy.  3. I will repeat her ACE level today.  4. [name] will follow up with me after the testing above has been      completed to discuss further plans.            ______________________________  Leslye Peer, MD      RSB/MedQ  DD:  08/13/2006  DT:  08/14/2006  Job #:  [id number]   cc:   [name], M.D.  Ulyess Mort, MD

## 2011-03-29 NOTE — Assessment & Plan Note (Signed)
[facility]                           GASTROENTEROLOGY OFFICE NOTE   DRUE, CAMERA                        MRN:          [id number]  DATE:07/22/2006                            DOB:          12-31-1940    The patient is referred by Dr. Hyman Hopes because of some abdominal cramping and  diarrhea.  She said she had been somewhat anemic, had problems with  constipation, has had some dysphagia as well with some substernal pressure,  gas, belching, some left lower quadrant pain and suprapubic area, lots of  gas is noted with belching.  She said back in the 70s I did a colonoscopic  examination and removed a polyp.  She has not had any followup since.  She  does have a brother with colon polyps and an uncle with liver disease.   PAST MEDICAL HISTORY:  Reveals she has suffered with:  1. Hypertension.  2. Arrhythmias.  3. Hyperlipidemia.  4. Hypothyroidism.  5. Arthritis.  6. Panic disorders.  7. Allergies.  8. Kidney disease.   SOCIAL HISTORY:  Noncontributory.   REVIEW OF SYSTEMS:  Noncontributory except for frequent cough.   PHYSICAL EXAMINATION:  [name] is a very attractive-appearing, healthy young  lady, 5 feet 6 inches, weighs 166, blood pressure 140/84, pulse is 72 and  regular.  Neck arteries and extremities all are unremarkable.   IMPRESSION:  1. Gastroesophageal reflux disease with dysphagia.  2. Irritable bowel with constipation component, intestinal gas bloating,      and some substernal pressure.  3. History of anemia.  4. Hypertension.  5. Cardiac arrhythmias.  6. History of hyperlipidemia.  7. Hypothyroidism.  8. Arthritis.  9. History of panic disorders.  10.Allergies.  11.Renal disease.  12.Problem with sludge in gallbladder, that is per CT scan.   RECOMMENDATION:  Is that we put her on some Protonix 1 every morning,  discussion for endo and a colon, endo because of the long history of reflux  symptoms and some dysphagia,  and a colonoscopic examination because of her  gastrointestinal symptoms and for screening, and for change in bowel habits  and her anemia.                                   Ulyess Mort, MD   SML/MedQ  DD:  07/22/2006  DT:  07/23/2006  Job #:  [id number]   cc:   [name], M.D.

## 2011-03-29 NOTE — Assessment & Plan Note (Signed)
[facility]                             PULMONARY OFFICE NOTE   [name], [name]                        MRN:          [id number]  DATE:02/20/2007                            DOB:          14-Sep-1941    SUBJECTIVE:  [name] is a very pleasant, 70 year old woman, who  follows up today for an abnormal CT scan, which is characterized by some  left, greater than right, lower lobe bronchiectasis, and some very mild  small, scattered pulmonary nodules.  There was some question initially  as to whether she could have sarcoidosis.  Her ACE level has been  normal.  We have been following her with serial CTs, as she has been  asymptomatic.  Her most recent CT was performed on April 8 and is as  detailed below.  She tells me that she has had some rare cough, but that  this is much improved since she began treatment for GERD.  She did have  an upper respiratory infection in January, but this has completely  resolved.  She has been under evaluation for questionable  hyperparathyroidism, has been seen by Dr. Talmage Nap and then referred to Dr.  Gerrit Friends with general surgery.  She tells me that she is scheduled for her  annual mammogram on February 24, 2007.  Her primary care had been through  [facility].  She has been getting most of her care with Dr. Elvis Coil  at [facility] in recent months.   MEDICATIONS:  1. Diltiazem 240 mg daily.  2. Paxil CR 25 mg daily.  3. Aspirin 81 mg daily.  4. Lovastatin 40 mg daily.  5. Micardis 40 mg daily.  6. Potassium chloride 20 mEq daily.  7. Levothyroxine 0.1 mg daily.   EXAM:  IN GENERAL:  This is a pleasant, well-appearing, overweight  African-American woman, who is in no distress on room air.  HEENT EXAM:  Benign.  I did not palpate any nodules in her neck.  LUNGS:  Clear to auscultation bilaterally.  HEART:  Has a regular rate and rhythm without murmur.  ABDOMEN:  Obese, soft, nontender, positive bowel sounds.  EXTREMITIES:  Have no cyanosis, clubbing or edema.   Her CT scan of the chest, performed on April 8 and compared with October  of 2007, shows stable small, scattered nodular densities and mild  bilateral lower lobe bronchiectasis that is unchanged.  There are no  masses, infiltrates or effusions noted.  She does have some axillary  lymphadenopathy, which is more prominent than on her previous scan.  The  largest node is on the right and measures 1.8 x 1.25 cm.  This is a new  finding.   IMPRESSION:  1. Stable bilateral nodular disease with mild lower lobe      bronchiectasis.  2. Axillary lymphadenopathy, which is more prominent than on her      previous exam.  The lymphadenopathy certainly could be related to      our previously-suspected diagnosis of sarcoidosis.  This also could      be just a reactive enlargement, due to infection.  I am concerned,      however, that she could be at risk for breast cancer.  She is      scheduled to have a mammogram on February 24, 2007.  She will proceed      with this test as scheduled.  Her usual followup is with [facility]      and Dr. Hyman Hopes.  I will see her in one month to insure that the test      was done and to review the results.  If she does not have a breast      lesion and the lymph node remains in question, then she may require      surgical biopsy.  We will follow up with Dr. Gerrit Friends regarding this,      should it become necessary.     Leslye Peer, MD  Electronically Signed    RSB/MedQ  DD: 02/20/2007  DT: 02/20/2007  Job #: [id number]   cc:   Dorisann Frames, M.D.  [name], M.D.  Velora Heckler, MD  [place] [place]

## 2011-03-29 NOTE — Assessment & Plan Note (Signed)
[facility]                               PULMONARY OFFICE NOTE   [name], [name]                        MRN:          [id number]  DATE:09/03/2006                            DOB:          [birth year]    SUBJECTIVE:  [name] is a pleasant 70 year old woman with a history of  hypertension, hypercholesterolemia, and allergic rhinitis kindly referred by  Dr. Elvis Coil for hypercalcemia and pulmonary nodules noted on CT scan of  the abdomen and then subsequently CT scan of the chest.  She is here to  discuss results of her interval studies.  She tells me she is doing well  without any pulmonary complaints.  Her exertional tolerance is good.   MEDICATIONS:  1. Triamterene HCTZ 75/50 mg daily.  2. Diltiazem 240 mg daily.  3. Paxil CR 25 mg daily.  4. Aspirin 81 mg daily.  5. Lovastatin 40 mg daily.  6. Levothyroxine 88 mcg daily.   EXAM:  GENERAL:  This is a pleasant elderly African-American woman is in no  distress on room air.  VITAL SIGNS:  Weight 167 pounds, temperature 97.8, blood pressure 116/70  which is improved.  Pulse 75.  SAO2 99% on room air.  HEENT:  Is benign.  She has no strider.  LUNGS:  Clear to auscultation bilaterally.  HEART:  Has a regular rate and rhythm without murmur.  ABDOMEN:  Is soft, nontender with positive bowel sounds.  EXTREMITIES:  Show no cyanosis, clubbing or edema.   CT scan of the chest was performed on August 18, 2006 with high resolution  cuts.  This was compared to July 2007 and also to an abdominal CT from the  end of 2006.  This showed stable bilateral pulmonary nodules that measured  up to 4 mm in the left lower lobe with some associated left lower lobe scar  and volume loss.  There is no pleural fluid or other infiltrates.   Her ACE level was 41 which is in the normal range.   Pulmonary function testing was performed on September 01, 2006.  This showed  normal spirometry without a bronchodilator  response. Normal lung volumes and  a slightly decreased DLCO that corrected for alveolar volume.   IMPRESSION:  1. Stable pulmonary nodules in a setting of chronic renal insufficiency      and hypercalcemia.  2. Normal pulmonary function testing.  3. Given her stable nodules, absence of lymphadenopathy and absence of air      flow limitation on pulmonary function tests, the likelihood for      sarcoidosis is low but we will continue to perform surveillance CTs on      her pulmonary nodules.  If she develops pulmonary symptoms      then she may still benefit from bronchoscopy and biopsies but I would      defer that at this time.  We will repeat her CT in six months.            ______________________________  Leslye Peer, MD      RSB/MedQ  DD:  09/03/2006  DT:  09/04/2006  Job #:  [id number]   cc:   [name], M.D.  Ulyess Mort, MD

## 2011-05-28 ENCOUNTER — Encounter: Payer: Self-pay | Admitting: Emergency Medicine

## 2011-05-29 ENCOUNTER — Encounter: Payer: Self-pay | Admitting: Emergency Medicine

## 2011-05-29 ENCOUNTER — Ambulatory Visit (INDEPENDENT_AMBULATORY_CARE_PROVIDER_SITE_OTHER): Payer: Medicare Other | Admitting: Emergency Medicine

## 2011-05-29 VITALS — BP 122/76 | HR 70 | Temp 98.0°F | Ht 66.0 in | Wt 195.8 lb

## 2011-05-29 DIAGNOSIS — J479 Bronchiectasis, uncomplicated: Secondary | ICD-10-CM

## 2011-05-29 NOTE — Patient Instructions (Signed)
Your breathing testing today is normal You are clear to get your knee surgery as planned.  Follow up with Dr Delton Coombes in a year or sooner if you have any problems

## 2011-05-29 NOTE — Assessment & Plan Note (Addendum)
Normal pre-op spirometry today. Do not anticipate any barriers to her getting her L TKR.  - cleared for surgery

## 2011-05-29 NOTE — Progress Notes (Signed)
[name] is a 70 year old woman with a history of hypertension, hypercholesterolemia, hypothyroidism, allergic rhinitis, and renal insufficiency followed by Dr. Elvis Coil. I saw her in 2007-8 for pulmonary nodules, mediastinal LAD and basilar bronchiectasis on CT scan. Bx of an axillary node showed no evidence for sarcoidosis.   Re-establishing care 03/22/10 -- Had an URI in Feb-March 2011, also some allergies. Used claritin, currently off as her symptoms improved. Still feeling a bit fatigued - being followed by Dr Hyman Hopes for anemia and hypokalemia, planning to GI eval for anemia. Reports continued globus sensation. Clears her throat and prod of clear to yellow. Has seen a streak of red in it before. No real cough, just throat clearing.   ROV 04/06/10 -- returns to discuss her bronchiectasis and cough. We started claritin last time, didn't need the flonase. Cough is better, globus sensation is better but not gone completely. Seems to be resolving. Reviewed CT scan of the chest - no change in bronchiectasis.   ROV 05/29/11 -- hx bronchiectasis and cough, allergies. Also followed for HTN, renal insufficiency. Planning for L TKR w Dr Eulah Pont, scheduled for August 22. Her cough is stable - seems to bother her when exposed to the [facility]. No flares, no abx, no prednisone for breathing.     Gen: Pleasant, well-nourished, in no distress,  normal affect  ENT: No lesions,  mouth clear,  oropharynx clear, no postnasal drip  Neck: No JVD, no TMG, no carotid bruits  Lungs: No use of accessory muscles, no dullness to percussion, clear without rales or rhonchi  Cardiovascular: RRR, heart sounds normal, no murmur or gallops, no peripheral edema  Musculoskeletal: No deformities, no cyanosis or clubbing  Neuro: alert, non focal  Skin: Warm, no lesions or rashes   Spirometry today  = normal airflows

## 2011-06-28 ENCOUNTER — Other Ambulatory Visit (HOSPITAL_COMMUNITY): Payer: Self-pay | Admitting: Orthopedic Surgery

## 2011-06-28 ENCOUNTER — Encounter (HOSPITAL_COMMUNITY)
Admission: RE | Admit: 2011-06-28 | Discharge: 2011-06-28 | Disposition: A | Discharge status: Home / Self Care | Payer: Medicare Other | Source: Ambulatory Visit | Attending: Orthopedic Surgery | Admitting: Orthopedic Surgery

## 2011-06-28 DIAGNOSIS — M1990 Unspecified osteoarthritis, unspecified site: Secondary | ICD-10-CM

## 2011-06-28 LAB — CBC
MCH: 25.3 pg — ABNORMAL LOW (ref 26.0–34.0)
Platelets: 64 10*3/uL — ABNORMAL LOW (ref 150–400)
RBC: 4.78 MIL/uL (ref 3.87–5.11)
WBC: 4.8 10*3/uL (ref 4.0–10.5)

## 2011-06-28 LAB — COMPREHENSIVE METABOLIC PANEL
AST: 13 U/L (ref 0–37)
Albumin: 3.8 g/dL (ref 3.5–5.2)
Alkaline Phosphatase: 117 U/L (ref 39–117)
BUN: 20 mg/dL (ref 6–23)
Chloride: 102 mEq/L (ref 96–112)
Potassium: 3.9 mEq/L (ref 3.5–5.1)
Total Bilirubin: 0.3 mg/dL (ref 0.3–1.2)

## 2011-06-28 LAB — SURGICAL PCR SCREEN: Staphylococcus aureus: NEGATIVE

## 2011-07-03 ENCOUNTER — Inpatient Hospital Stay (HOSPITAL_COMMUNITY)
Admission: RE | Admit: 2011-07-03 | Discharge: 2011-07-06 | DRG: 470 | Disposition: A | Discharge status: Skilled Nursing Facility | Payer: Medicare Other | Source: Ambulatory Visit | Attending: Orthopedic Surgery | Admitting: Orthopedic Surgery

## 2011-07-03 ENCOUNTER — Inpatient Hospital Stay (HOSPITAL_COMMUNITY): Payer: Medicare Other

## 2011-07-03 DIAGNOSIS — D62 Acute posthemorrhagic anemia: Secondary | ICD-10-CM | POA: Diagnosis not present

## 2011-07-03 DIAGNOSIS — N181 Chronic kidney disease, stage 1: Secondary | ICD-10-CM | POA: Diagnosis present

## 2011-07-03 DIAGNOSIS — K5900 Constipation, unspecified: Secondary | ICD-10-CM | POA: Diagnosis present

## 2011-07-03 DIAGNOSIS — Z96659 Presence of unspecified artificial knee joint: Secondary | ICD-10-CM

## 2011-07-03 DIAGNOSIS — Z79899 Other long term (current) drug therapy: Secondary | ICD-10-CM

## 2011-07-03 DIAGNOSIS — E876 Hypokalemia: Secondary | ICD-10-CM | POA: Diagnosis not present

## 2011-07-03 DIAGNOSIS — M810 Age-related osteoporosis without current pathological fracture: Secondary | ICD-10-CM | POA: Diagnosis present

## 2011-07-03 DIAGNOSIS — I129 Hypertensive chronic kidney disease with stage 1 through stage 4 chronic kidney disease, or unspecified chronic kidney disease: Secondary | ICD-10-CM | POA: Diagnosis present

## 2011-07-03 DIAGNOSIS — E039 Hypothyroidism, unspecified: Secondary | ICD-10-CM | POA: Diagnosis present

## 2011-07-03 DIAGNOSIS — E785 Hyperlipidemia, unspecified: Secondary | ICD-10-CM | POA: Diagnosis present

## 2011-07-03 DIAGNOSIS — M1710 Unilateral primary osteoarthritis, unspecified knee: Principal | ICD-10-CM | POA: Diagnosis present

## 2011-07-03 DIAGNOSIS — D693 Immune thrombocytopenic purpura: Secondary | ICD-10-CM | POA: Diagnosis present

## 2011-07-03 DIAGNOSIS — Z7982 Long term (current) use of aspirin: Secondary | ICD-10-CM

## 2011-07-03 DIAGNOSIS — K219 Gastro-esophageal reflux disease without esophagitis: Secondary | ICD-10-CM | POA: Diagnosis present

## 2011-07-03 DIAGNOSIS — F329 Major depressive disorder, single episode, unspecified: Secondary | ICD-10-CM | POA: Diagnosis present

## 2011-07-03 DIAGNOSIS — Z87891 Personal history of nicotine dependence: Secondary | ICD-10-CM

## 2011-07-03 DIAGNOSIS — F3289 Other specified depressive episodes: Secondary | ICD-10-CM | POA: Diagnosis present

## 2011-07-03 DIAGNOSIS — R911 Solitary pulmonary nodule: Secondary | ICD-10-CM | POA: Diagnosis present

## 2011-07-03 LAB — URINALYSIS, ROUTINE W REFLEX MICROSCOPIC
Nitrite: NEGATIVE
Specific Gravity, Urine: 1.014 (ref 1.005–1.030)
pH: 5.5 (ref 5.0–8.0)

## 2011-07-03 LAB — URINE MICROSCOPIC-ADD ON

## 2011-07-04 DIAGNOSIS — D649 Anemia, unspecified: Secondary | ICD-10-CM

## 2011-07-04 DIAGNOSIS — D696 Thrombocytopenia, unspecified: Secondary | ICD-10-CM

## 2011-07-04 LAB — DIC (DISSEMINATED INTRAVASCULAR COAGULATION)PANEL
D-Dimer, Quant: 6.66 ug/mL-FEU — ABNORMAL HIGH (ref 0.00–0.48)
INR: 1.31 (ref 0.00–1.49)
Platelets: 58 10*3/uL — ABNORMAL LOW (ref 150–400)
Prothrombin Time: 16.5 seconds — ABNORMAL HIGH (ref 11.6–15.2)

## 2011-07-04 LAB — BASIC METABOLIC PANEL
BUN: 12 mg/dL (ref 6–23)
Calcium: 9.4 mg/dL (ref 8.4–10.5)
Chloride: 102 mEq/L (ref 96–112)
Creatinine, Ser: 0.89 mg/dL (ref 0.50–1.10)
GFR calc Af Amer: >60 mL/min (ref >60)
GFR calc non Af Amer: >60 mL/min (ref >60)

## 2011-07-04 LAB — CBC
Hemoglobin: 10.1 g/dL — ABNORMAL LOW (ref 12.0–15.0)
MCH: 25.5 pg — ABNORMAL LOW (ref 26.0–34.0)
MCH: 26.5 pg (ref 26.0–34.0)
MCHC: 32.6 g/dL (ref 30.0–36.0)
MCV: 78.7 fL (ref 78.0–100.0)
Platelets: 46 10*3/uL — ABNORMAL LOW (ref 150–400)
RDW: 16.7 % — ABNORMAL HIGH (ref 11.5–15.5)

## 2011-07-04 LAB — DIRECT ANTIGLOBULIN TEST (NOT AT ARMC)
DAT, IgG: POSITIVE
DAT, complement: NEGATIVE

## 2011-07-04 LAB — RETICULOCYTES
Retic Count, Absolute: 60.8 10*3/uL (ref 19.0–186.0)
Retic Ct Pct: 1.6 % (ref 0.4–3.1)

## 2011-07-04 LAB — PROTIME-INR: Prothrombin Time: 15.3 seconds — ABNORMAL HIGH (ref 11.6–15.2)

## 2011-07-04 LAB — TECHNOLOGIST SMEAR REVIEW

## 2011-07-04 NOTE — Op Note (Signed)
NAMEFIORA, WEILL NO.:  [id number]  MEDICAL RECORD NO.:  [id number]  LOCATION:  5014                         FACILITY:  [facility]  PHYSICIAN:  Loreta Ave, M.D. DATE OF BIRTH:  05/21/1941  DATE OF PROCEDURE:  07/03/2011 DATE OF DISCHARGE:                              OPERATIVE REPORT   PREOPERATIVE DIAGNOSES:  Left knee end-stage degenerative arthritis, varus alignment.  POSTOPERATIVE DIAGNOSES:  Left knee end-stage degenerative arthritis, varus alignment with significant subchondral interosseus cyst in medial tibial plateau.  PROCEDURE:  Modified minimally invasive left total knee replacement, Stryker triathlon prosthesis.  Soft tissue balancing.  Autograft grafting of cyst in medial tibial plateau.  A cemented posterior stabilized #3 femoral component pegged.  Cemented #4 tibial component with a 12 x 50-mm stem and a 9-mm polyethylene insert.  Cemented 35-mm patellar component.  SURGEON:  Loreta Ave, MD  ASSISTANT:  Laural Benes. Su Hilt, [place], present throughout the entire case and necessary for timely completion of procedure.  ANESTHESIA:  General.  BLOOD LOSS:  Minimal.  SPECIMEN:  None.  CULTURES:  None.  COMPLICATIONS:  None.  DRESSING:  Soft compressive knee immobilizer.  DRAIN:  Hemovac x1.  TOURNIQUET TIME:  One hour and 15 minutes.  PROCEDURE:  The patient was brought to the operating room and placed on the operating table in supine position.  After adequate anesthesia had been obtained, left knee was examined.  Mild flexion contracture, varus alignment partially correctable flexion 90 degrees.  Tourniquet applied, prepped and draped in usual sterile fashion.  Exsanguinated with elevation of Esmarch, tourniquet inflated to 350 mmHg.  Straight incision above the patella down to the tibial tubercle.  Skin and subcutaneous tissue divided.  Hemostasis with cautery.  Medial arthrotomy, vastus splitting preserving quad tendon.  Knee  exposed. Grade 4 change throughout.  Medial capsule release.  Remnants of menisci, cruciate ligaments, loose body spurs removed.  Intramedullary guide on the femur.  10-mm resection set at 5 degrees valgus.  Using epicondylar axis sized, cut, and fitted for posterior stabilized peg #3 component.  Proximal resection of the tibia, extramedullary guide below the defect medially, 0-degree cut.  A 1.5-cm diameter cyst in the medial tibia was curetted out, treated with drilling.  This was then packed with cancellous bone.  Debris cleared throughout the knee.  Patella exposed, posterior 10 mm removed.  Drilled, sized, and fitted for a 35- mm component.  The trials put in place throughout.  #3 above, #4 below, 9-mm insert, and 35 on the patella.  With this and with the soft tissue balancing, good mechanical axis, good balancing flexion/extension, good patellofemoral tracking.  Tibia was marked for rotation and hand reamed for not only the keel but for the stem that I utilized to offset pressure because of the cyst.  All trials had been removed.  Copious irrigation with pulse irrigating device.  The cystic area was then repacked with a little additional graft from the femoral cutting. Cement prepared, placed on all components, firmly seated.  Polyethylene attached to tibia and knee reduced.  Patella clamped.  Once cement hardened, the knee was reexamined.  Again, I was pleased with balancing flexion/extension, alignment, stability,  mechanical axis.  Wound irrigated.  Hemovac placed and brought through separate stab wound. Arthrotomy closed with #1 Vicryl, skin and subcutaneous tissue with Vicryl and staples.  Sterile compressive dressing applied.  Tourniquet deflated and removed.  Knee immobilizer applied.  Anesthesia reversed. Brought to the room.  Tolerated the surgery well.  No complications.     Loreta Ave, M.D.     DFM/MEDQ  D:  07/04/2011  T:  07/04/2011  Job:   [id number]  Electronically Signed by Mckinley Jewel M.D. on 07/04/2011 04:16:05 PM

## 2011-07-04 NOTE — Consult Note (Signed)
NAMEMarland Kitchen  [name], [name] NO.:  [id number]  MEDICAL RECORD NO.:  [id number]  LOCATION:  5014                         FACILITY:  [facility]  PHYSICIAN:  Andreas Blower, MD       DATE OF BIRTH:  1941/01/03  DATE OF CONSULTATION: DATE OF DISCHARGE:                                CONSULTATION   REFERRING PHYSICIAN:  Loreta Ave, MD with Orthopedic Service.  PRIMARY CARE PHYSICIAN:  Dorisann Frames, MD  PRIMARY GASTROENTEROLOGIST:  [facility].  PULMONOLOGIST:  Leslye Peer, MD  CARDIOLOGIST:  Nanetta Batty, MD with [facility].  ORTHOPEDIC PHYSICIAN:  Loreta Ave, MD  CHIEF COMPLAINT:  Thrombocytopenia and help to manage medical issues.  HISTORY OF PRESENT ILLNESS:  [name] is a 70 year old African American female with history of hypertension, hypothyroidism, hyperlipidemia, GERD, depression, chronic kidney disease stage I, pulmonary nodules, history of bronchiectasis, and chronic cough who had left knee replacement surgery yesterday.  The patient reported that she was in her usual state of health prior to surgery, has not had any fevers, chills, nausea, vomiting, has not had any chest pain, shortness of breath, has not had any abdominal pain, diarrhea, headaches, or vision changes.  Prior to surgery, the patient had blood work done, which indicated that her hemoglobin was 12.1 and her platelet count was 64.  Her electrolytes were normal with a creatinine of 1.06.  Her calcium was initially elevated at 10.8.  The patient's laboratory workup was done on June 28, 2011.  Subsequently after having knee surgery done yesterday, laboratory work at this morning revealed her hemoglobin was 7.6 with a platelet count of 46.  As a result, the Hospitalist Service was consulted and asked to manage her medical issues and help further workup of thrombocytopenia.  REVIEW OF SYSTEMS:  All systems were reviewed with the patient unless positive as  per HPI, otherwise all other systems are negative.  PAST MEDICAL HISTORY: 1. Hypertension. 2. Hypothyroidism. 3. Hyperlipidemia. 4. GERD. 5. Depression. 6. Chronic kidney disease, stage I. 7. History of pulmonary nodules, has had biopsy in the past and was     thought not to have sarcoidosis. 8. History of bronchiectasis with chronic cough. 9. Questionable history of lupus in the past.  SOCIAL HISTORY:  The patient quit smoking about 33 years ago.  Denies any alcohol use.  Lives by herself.  FAMILY HISTORY:  Father had a heart attack.  Mother had dialysis for 10 years before dying.  PHYSICAL EXAMINATION:  VITAL SIGNS:  Temperature 99.5, pulse 87, respirations 16, blood pressure 118/69, satting 100% on oxygen. GENERAL:  The patient was alert and oriented, not appear to be in acute distress, was lying in bed comfortably. HEENT:  Extraocular motions are intact.  Pupils are equal and round, had moist mucous membranes. NECK:  Supple. HEART:  Was regular with S1-S2. LUNGS:  Clear to auscultation bilaterally. ABDOMEN:  Soft, nontender, and nondistended.  Positive bowel sounds. EXTREMITIES:  The patient has good peripheral pulses with trace edema, left lower extremity in a brace. NEUROLOGIC:  Cranial nerves II through XII grossly intact.  Had 5/5 motor strength in upper as well as lower  extremities.  LABORATORY STUDIES:  CBC shows a white count of 6.1, hemoglobin 7.6, hematocrit 23.3, platelet count 46.  INR 1.18.  Electrolytes normal with BUN of 12, creatinine 0.89, calcium is 9.4.  CBC from June 28, 2011, shows white count of 4.8, hemoglobin 12.1, hematocrit 36.9, platelet count 64.  The patient has laboratory workup done on Mar 22, 2011, which showed CBC with a white count of 6.0, hemoglobin 11.0, hematocrit 33.3, platelet count 247.  ASSESSMENT AND PLAN: 1. Thrombocytopenia, etiology unclear.  Given questionable history of     lupus in the past and no recent change in  medication, suspect that     the patient may have some sort of autoimmune process that could be     causing thrombocytopenia.  Dr. Vedia Pereyra with the Hematology has     been consulted and will evaluate the patient.  Uncertain if the     patient has idiopathic thrombocytopenic purpura, we will defer to     Dr. Vedia Pereyra whether the patient should be started on steroids.     After extensive discussion with Dr. Vedia Pereyra and with Orthopedic     Service, we will continue Lovenox for the time being as the patient     is at risk for thromboembolic event given that she had knee surgery     done.  We will also continue Coumadin for that at the time being.     Alternative anticoagulation such as fondaparinux was consistent;     however, if the patient would have a bleeding event, options to     reverse anticoagulation would be limited.  We will send for     peripheral smear. Will send for ANA and lab workup for lupus. 2. Hypertension, stable.  Continue the patient on home medications. 3. Hypothyroidism.  Continue the patient on home medications. 4. Gastroesophageal reflux disease.  Continue the patient on PPI. 5. Hyperlipidemia.  Continue statin. 6. Anemia due to acute blood loss likely from thrombocytopenia and     surgery.  The patient is being transfused to 2 units of blood,     continue to monitor. 7. Chronic kidney disease, stage I.  Creatinine is normal at this     time.  Continue to monitor. 8. Status post left knee surgery. Per ortho. 9. Prophylaxis.  Continue Lovenox and Coumadin at this time.  We will     closely trend CBCs and platelets.  Thank you for the consultation.  We will continue to follow.   Andreas Blower, MD    SR/MEDQ  D:  07/04/2011  T:  07/04/2011  Job:  [id number]  Electronically Signed by Wardell Heath Dreden Rivere  on 07/04/2011 09:27:16 PM

## 2011-07-05 LAB — COMPREHENSIVE METABOLIC PANEL
ALT: 7 U/L (ref 0–35)
AST: 10 U/L (ref 0–37)
Albumin: 2.6 g/dL — ABNORMAL LOW (ref 3.5–5.2)
Alkaline Phosphatase: 87 U/L (ref 39–117)
Calcium: 10.1 mg/dL (ref 8.4–10.5)
GFR calc Af Amer: >60 mL/min (ref >60)
Potassium: 3.1 mEq/L — ABNORMAL LOW (ref 3.5–5.1)
Sodium: 138 mEq/L (ref 135–145)
Total Protein: 6.2 g/dL (ref 6.0–8.3)

## 2011-07-05 LAB — PROTIME-INR
INR: 1.54 — ABNORMAL HIGH (ref 0.00–1.49)
Prothrombin Time: 18.8 seconds — ABNORMAL HIGH (ref 11.6–15.2)

## 2011-07-05 LAB — CBC
MCV: 78.3 fL (ref 78.0–100.0)
Platelets: 61 10*3/uL — ABNORMAL LOW (ref 150–400)
RBC: 3.59 MIL/uL — ABNORMAL LOW (ref 3.87–5.11)
RDW: 16.2 % — ABNORMAL HIGH (ref 11.5–15.5)
WBC: 8.1 10*3/uL (ref 4.0–10.5)

## 2011-07-05 LAB — TYPE AND SCREEN
ABO/RH(D): B NEG
Antibody Screen: POSITIVE
DAT, IgG: POSITIVE
Donor AG Type: NEGATIVE
Donor AG Type: NEGATIVE

## 2011-07-05 LAB — EXTRACTABLE NUCLEAR ANTIGEN ANTIBODY
ENA SM Ab Ser-aCnc: <1 AU/mL (ref <30)
SSA (Ro) (ENA) Antibody, IgG: 1 AU/mL (ref <30)
Scleroderma (Scl-70) (ENA) Antibody, IgG: <1 AU/mL (ref <30)
Sm/rnp: <1 AU/mL (ref <30)
ds DNA Ab: 4 IU/mL (ref <30)

## 2011-07-05 LAB — DIFFERENTIAL
Basophils Absolute: 0 10*3/uL (ref 0.0–0.1)
Eosinophils Absolute: 0 10*3/uL (ref 0.0–0.7)
Eosinophils Relative: 0 % (ref 0–5)
Lymphs Abs: 1.2 10*3/uL (ref 0.7–4.0)
Neutrophils Relative %: 75 % (ref 43–77)

## 2011-07-05 LAB — HAPTOGLOBIN: Haptoglobin: 157 mg/dL (ref 30–200)

## 2011-07-05 LAB — FERRITIN: Ferritin: 115 ng/mL (ref 10–291)

## 2011-07-05 LAB — IRON AND TIBC

## 2011-07-06 LAB — BASIC METABOLIC PANEL
CO2: 33 mEq/L — ABNORMAL HIGH (ref 19–32)
Calcium: 10.2 mg/dL (ref 8.4–10.5)
GFR calc non Af Amer: 53 mL/min — ABNORMAL LOW (ref >60)
Potassium: 3 mEq/L — ABNORMAL LOW (ref 3.5–5.1)
Sodium: 137 mEq/L (ref 135–145)

## 2011-07-06 LAB — CBC
HCT: 26.1 % — ABNORMAL LOW (ref 36.0–46.0)
MCV: 77.9 fL — ABNORMAL LOW (ref 78.0–100.0)
Platelets: 60 10*3/uL — ABNORMAL LOW (ref 150–400)
RBC: 3.35 MIL/uL — ABNORMAL LOW (ref 3.87–5.11)
RDW: 16.3 % — ABNORMAL HIGH (ref 11.5–15.5)
WBC: 6.8 10*3/uL (ref 4.0–10.5)

## 2011-07-06 LAB — HEPARIN INDUCED THROMBOCYTOPENIA PNL
Heparin Induced Plt Ab: NEGATIVE
Patient O.D.: 0.078
UFH SRA Result: NEGATIVE

## 2011-07-06 LAB — PROTIME-INR: INR: 1.77 — ABNORMAL HIGH (ref 0.00–1.49)

## 2011-07-16 ENCOUNTER — Encounter (INDEPENDENT_AMBULATORY_CARE_PROVIDER_SITE_OTHER): Payer: Medicare Other | Admitting: *Deleted

## 2011-07-16 ENCOUNTER — Other Ambulatory Visit: Payer: Self-pay | Admitting: Orthopedic Surgery

## 2011-07-16 DIAGNOSIS — M79609 Pain in unspecified limb: Secondary | ICD-10-CM

## 2011-07-18 ENCOUNTER — Encounter: Payer: Medicare Other | Admitting: Oncology

## 2011-07-23 NOTE — Discharge Summary (Signed)
  NAMEMarland Gould  [name], [name] NO.:  [id number]  MEDICAL RECORD NO.:  [id number]  LOCATION:  5014                         FACILITY:  [facility]  PHYSICIAN:  Loreta Ave, M.D. DATE OF BIRTH:  10-10-1941  DATE OF ADMISSION:  07/03/2011 DATE OF DISCHARGE:07/06/2011                              DISCHARGE SUMMARY   ADDENDUM:  DISCHARGE MEDICATIONS:  The patient will be on Coumadin 5 mg one p.o. daily to maintain INR between 2.0 and 3.0 per pharmacy protocol.  Also adding Nu-Iron 150 mg p.o. b.i.d., KCl 40 mEq p.o. b.i.d. and discontinue Lovenox.  Also please addend the discharge summary to confirm that she will continue on her CPM 0-80 degrees increased by 5-10 degrees per day until she was going 0-90 that should be maintained for 5-6 hours per day until she returns to see Dr. Eulah Pont.     Laural Benes. Jannet Mantis.   ______________________________ Loreta Ave, M.D.    JBR/MEDQ  D:  07/06/2011  T:  07/06/2011  Job:  [id number]  Electronically Signed by Laurell Josephs. on 07/08/2011 11:32:33 AM Electronically Signed by Mckinley Jewel M.D. on 07/23/2011 02:46:22 PM

## 2011-08-02 ENCOUNTER — Other Ambulatory Visit (HOSPITAL_COMMUNITY): Payer: Self-pay | Admitting: Oncology

## 2011-08-02 ENCOUNTER — Encounter (HOSPITAL_BASED_OUTPATIENT_CLINIC_OR_DEPARTMENT_OTHER): Payer: Medicare Other | Admitting: Oncology

## 2011-08-02 DIAGNOSIS — D696 Thrombocytopenia, unspecified: Secondary | ICD-10-CM

## 2011-08-02 LAB — COMPREHENSIVE METABOLIC PANEL
ALT: 10 U/L (ref 0–35)
AST: 14 U/L (ref 0–37)
Albumin: 3.8 g/dL (ref 3.5–5.2)
CO2: 28 mEq/L (ref 19–32)
Calcium: 11.3 mg/dL — ABNORMAL HIGH (ref 8.4–10.5)
Chloride: 98 mEq/L (ref 96–112)
Creatinine, Ser: 1.24 mg/dL — ABNORMAL HIGH (ref 0.50–1.10)
Potassium: 4.6 mEq/L (ref 3.5–5.3)
Sodium: 134 mEq/L — ABNORMAL LOW (ref 135–145)
Total Protein: 7.9 g/dL (ref 6.0–8.3)

## 2011-08-02 LAB — CBC & DIFF AND RETIC
BASO%: 0.2 % (ref 0.0–2.0)
Basophils Absolute: 0 10*3/uL (ref 0.0–0.1)
EOS%: 1.3 % (ref 0.0–7.0)
HGB: 11.2 g/dL — ABNORMAL LOW (ref 11.6–15.9)
Immature Retic Fract: 7.5 % (ref 1.60–10.00)
MCH: 26.4 pg (ref 25.1–34.0)
MCHC: 32.4 g/dL (ref 31.5–36.0)
MCV: 81.6 fL (ref 79.5–101.0)
MONO%: 7.2 % (ref 0.0–14.0)
RBC: 4.24 10*6/uL (ref 3.70–5.45)
RDW: 18.5 % — ABNORMAL HIGH (ref 11.2–14.5)
Retic %: 1.99 % (ref 0.70–2.10)
Retic Ct Abs: 84.38 10*3/uL (ref 33.70–90.70)
lymph#: 1.8 10*3/uL (ref 0.9–3.3)

## 2011-08-02 LAB — LACTATE DEHYDROGENASE: LDH: 174 U/L (ref 94–250)

## 2011-08-02 LAB — MORPHOLOGY: PLT EST: DECREASED

## 2011-08-03 LAB — IRON AND TIBC: %SAT: 17 % — ABNORMAL LOW (ref 20–55)

## 2011-08-03 LAB — DIRECT ANTIGLOBULIN TEST (NOT AT ARMC): DAT (Complement): NEGATIVE

## 2011-08-03 LAB — HAPTOGLOBIN: Haptoglobin: 116 mg/dL (ref 30–200)

## 2011-08-14 NOTE — Discharge Summary (Signed)
[name], [name] NO.:  [id number]  MEDICAL RECORD NO.:  [id number]  LOCATION:  5014                         FACILITY:  [facility]  PHYSICIAN:  [name], M.D. DATE OF BIRTH:  1941-09-09  DATE OF ADMISSION:  07/03/2011 DATE OF DISCHARGE:  07/06/2011                        DISCHARGE SUMMARY - REFERRING   ADMITTING DIAGNOSES: 1. End-stage degenerative joint disease, left knee. 2. Osteoporosis. 3. Hypothyroidism. 4. Depression. 5. Iron deficiency anemia. 6. Gastroesophageal reflux disease. 7. Hypertension. 8. Renal insufficiency. 9. Hyperlipidemia. 10.Pulmonary nodules. 11.Hypercalcemia.  DISCHARGE DIAGNOSIS: 1. End-stage degenerative joint disease, left knee, status post total     knee replacement. 2. Acute blood loss anemia on top of chronic iron deficiency anemia. 3. Idiopathic thrombocytopenia. 4. Hypothyroidism. 5. Depression. 6. Gastroesophageal reflux disease. 7. Hypertension. 8. Renal insufficiency. 9. Dyslipidemia. 10.Pulmonary nodules. 11.Hypercalcemia. 12.Constipation. 13.Hypokalemia.  HISTORY OF PRESENT ILLNESS:  The patient is a 70 year old African American female with a history of end-stage DJD of her left knee.  She failed conservative care including anti-inflammatories and intra- articular cortisone injections.  Risks, benefits and possible complications of a total knee arthroplasty were discussed with the patient.  She is without question.  She is recently status post a right total knee replacement.  Procedures in-house on July 03, 2011, the patient underwent a left total knee replacement by Dr. Eulah Gould and a femoral nerve block by Anesthesia.  She tolerated both procedures well and was admitted postoperatively for pain control, DVT prophylaxis and physical therapy. In the recovery room, she was placed on a CPM 0 to 90 degrees and did well.  Postop day #1, she dropped her platelets from 64 down to 41. Hematology was  consulted to ask which medicine to help manage this patient.  Dr. Arline Gould from [facility] is who saw her.  He watched her throughout her hospital course and saw that her platelets were back up on the rise.  Postop day #2, they were up to 61.  She was hemodynamically stable with a hemoglobin at 9.4.  Postop day #2, he felt he could follow her conservatively.  He did not feel this was heparin- induced thrombocytopenia.  He recommended continued anticoagulation with Lovenox transitioning to Coumadin.  Her INR is 1.5 today, hopefully it will be close to therapeutic on her discharge tomorrow which is Saturday and she will not need Lovenox, if her INR is 2.0 or greater she does not need Lovenox.  She is slow-moving with physical therapy, ambulating between 10 and 15 feet, 1+ assist.  Surgical wound is well-approximated. She has significant large serous bullous areas on both sides of her wound.  The wound itself is well-approximated with staples and a small amount of serosanguineous drainage.  She has no redness.  She has 2+ dorsalis pedis pulses, so is unable to do a straight leg raise at this time.  She is being discharged to [facility]illed Nursing on a regular diet for physical therapy, occupational therapy, wound management and medical management.  DISCHARGE MEDICATIONS: 1. Coumadin 4 mg 1 tablet daily to maintain her INR between 2.0 and     3.0. 2. Hydrocodone 5/325 one to two q.4 h. p.r.n. pain. 3. Calcium over the counter  1 tablet daily. 4. Claritin 10 mg 1 tablet daily. 5. Flonase nasal spray 2 sprays nasally twice a day. 6. Levothyroxine 100 mcg 1 tablet daily. 7. Lovastatin 40 mg 1 tablet nightly. 8. Micardis HCT 80/12.5 one tablet daily, hold if blood pressure is     less than 120/80. 9. Multivitamins 1 tablet daily. 10.Omeprazole 20 mg 1 tablet daily. 11.Paxil CR 25 mg 1 tablet daily. 12.Senokot-S 1 tablet daily while on narcotics. 13.Vitamin D3 2000 units 1 tablet  daily. 14.Iron 325 mg 1 tablet daily.  She has been instructed not to take Tylenol, not to take aspirin and not to take her hydrocodone 7.5/750.  She needs to follow up with Dr. Eulah Gould in 2 weeks for staple removal and x-rays.  Please call Dr. Eulah Gould if she has increased pain, increased swelling, increased redness or temperature greater than 101.  Once again, the knee at this point in time, surgical wound is well- approximated, the knee itself is very swollen and has a significant amount of blisters, that if any point in time these blisters will get infected, please call Dr. Eulah Gould [phone].     [name] Girt, PA-C   ______________________________ [name], M.D.    KS/MEDQ  D:  07/05/2011  T:  07/05/2011  Job:  [id number]  Electronically Signed by [name] P.A. on 07/29/2011 09:30:01 AM Electronically Signed by [name] M.D. on 08/14/2011 02:31:22 PM

## 2011-08-16 ENCOUNTER — Encounter (HOSPITAL_BASED_OUTPATIENT_CLINIC_OR_DEPARTMENT_OTHER): Payer: Medicare Other | Admitting: Oncology

## 2011-08-16 ENCOUNTER — Other Ambulatory Visit (HOSPITAL_COMMUNITY): Payer: Self-pay | Admitting: Oncology

## 2011-08-16 DIAGNOSIS — D696 Thrombocytopenia, unspecified: Secondary | ICD-10-CM

## 2011-08-16 LAB — CBC WITH DIFFERENTIAL/PLATELET
BASO%: 0.5 % (ref 0.0–2.0)
MCHC: 32 g/dL (ref 31.5–36.0)
MONO#: 0.5 10*3/uL (ref 0.1–0.9)
NEUT#: 3.5 10*3/uL (ref 1.5–6.5)
RBC: 4.2 10*6/uL (ref 3.70–5.45)
RDW: 17.4 % — ABNORMAL HIGH (ref 11.2–14.5)
WBC: 6.4 10*3/uL (ref 3.9–10.3)
lymph#: 2.4 10*3/uL (ref 0.9–3.3)
nRBC: 0 % (ref 0–0)

## 2011-08-19 LAB — CROSSMATCH
ABO/RH(D): B NEG
Donor AG Type: NEGATIVE

## 2011-08-19 LAB — CBC
HCT: 25.7 — ABNORMAL LOW
HCT: 26.3 — ABNORMAL LOW
HCT: 28.3 — ABNORMAL LOW
Hemoglobin: 8 — ABNORMAL LOW
Hemoglobin: 9.5 — ABNORMAL LOW
MCHC: 32.7
MCHC: 32.9
MCHC: 33.4
MCV: 72.6 — ABNORMAL LOW
MCV: 74.4 — ABNORMAL LOW
Platelets: 189
Platelets: 219
Platelets: 224
RBC: 3.63 — ABNORMAL LOW
RBC: 3.71 — ABNORMAL LOW
RDW: 18.6 — ABNORMAL HIGH
RDW: 19.2 — ABNORMAL HIGH
WBC: 4.9

## 2011-08-19 LAB — PROTIME-INR
Prothrombin Time: 14.5
Prothrombin Time: 22.8 — ABNORMAL HIGH

## 2011-08-19 LAB — BASIC METABOLIC PANEL
BUN: 10
BUN: 8
BUN: 8
CO2: 26
CO2: 29
CO2: 31
Calcium: 9.3
Chloride: 101
Creatinine, Ser: 1.06
GFR calc non Af Amer: 46 — ABNORMAL LOW
GFR calc non Af Amer: 49 — ABNORMAL LOW
Glucose, Bld: 97
Glucose, Bld: 98
Potassium: 3.1 — ABNORMAL LOW
Potassium: 3.3 — ABNORMAL LOW
Potassium: 3.4 — ABNORMAL LOW
Sodium: 139
Sodium: 139

## 2011-08-19 LAB — DIFFERENTIAL
Basophils Absolute: 0
Basophils Relative: 1
Eosinophils Relative: 1
Monocytes Absolute: 0.5
Neutro Abs: 3.8

## 2011-08-19 NOTE — Consult Note (Signed)
  NAMESENIYA, STOFFERS NO.:  [id number]  MEDICAL RECORD NO.:  [id number]  LOCATION:  5014                         FACILITY:  [facility]  PHYSICIAN:  Samul Dada, M.D.DATE OF BIRTH:  07-Oct-1941  DATE OF CONSULTATION: DATE OF DISCHARGE:                                CONSULTATION   ADDENDUM:  Inspection of the peripheral smear on July 04, 2011, did not show platelet clumping, and her platelet count in citrate too was compatible to that done in EDTA.  The platelets are normal in size, but have some large platelets consistent with peripheral process.  CBC this morning after 2 units of packed RBCs was 9.1 and 28.1 respectively, with a white count of 8.1 and platelets of 61.  The smear shows elliptocytes suggesting iron deficiency.  RBC indices slightly low.  Hemolytic workup is negative, but her DAT is positive for IgG, negative for complement. The DIC workup is equivocal, probably not clinically significant.  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  Her slightly low decreasing platelet count from the preoperative to postoperative status, is probably secondary to platelet utilization.  Her low hemoglobin and hematocrit after surgery may be secondary to bleeding, dilution, and possibly to clinical hemolysis.  She may be iron deficient.  We will check stools for occult GI bleed.  Check iron studies.  Continue platelet monitoring. In addition, continue with postop anticoagulation protocol and watch for bleeding.  Lupus workup is pending.  Based on the findings, we will suggest hematological followup after discharge.  We will be glad to see her at the office of the cancer center once she is discharged.  Thank you very much for allowing [place] the opportunity to participate in the care of Mrs. Lukes.     Marlowe Kays, PA-C   ______________________________ Samul Dada, M.D.    SW/MEDQ  D:   07/05/2011  T:  07/05/2011  Job:  [id number]  Electronically Signed by Marlowe Kays P.A. on 07/19/2011 03:07:32 PM Electronically Signed by Kimberlee Nearing M.D. on 08/19/2011 09:59:11 PM

## 2011-08-19 NOTE — Consult Note (Signed)
[name], [name] NO.:  [id number]  MEDICAL RECORD NO.:  [id number]  LOCATION:  5014                         FACILITY:  [facility]  PHYSICIAN:  Gerarda Fraction Ludene Stokke, M.D.DATE OF BIRTH:  06/29/1941  DATE OF CONSULTATION:  07/04/2011 DATE OF DISCHARGE:                                CONSULTATION   REASON FOR CONSULTATION:  Thrombocytopenia.  CONSULTING PHYSICIAN:  Dr. Arline Asp.  PRIMARY PHYSICIAN:  Dr. Talmage Nap.  HISTORY OF PRESENT ILLNESS:  [name] is a 70 year old African American female with multiple medical problems listed below, requiring multiple medications.  The patient was admitted on July 03, 2011, for left total knee replacement, due to failure of conservative treatment. The platelet count as of June 28, 2011 was 64,000 and on the day of request is 46,000.  Of note, back in May and June of this year, her platelet count was normal ranging from 250,000-400,000.  She denies any prior history of low platelet count, bleeding, or bruising.  She denies any recent new medicines or GI bleeding.  To date, she received 2 units of packed RBCs this afternoon, for a hemoglobin of 7.6 and a hematocrit of 23.3.  Of note, the patient was DAT positive and has indirect Coombs. Her CBC and peripheral blood smear are currently pending.  Citrate to platelet count is pending as well, as well as her autoimmune workup.  We were asked to see her in consultation, requiring her low platelet count. The patient is on the need of anticoagulation due to her temporary inability to mobilize freely due to postoperative issues.  PAST MEDICAL HISTORY: 1. End-stage osteoarthritis of the knee, as above. 2. History of vital gastroenteritis in February 2010. 3. Chronic renal insufficiency, with baseline creatinine of 1.5. 4. Hypothyroidism. 5. History of Graves disease. 6. Hypertension. 7. Hyperlipidemia. 8. GERD. 9. Depression. 10.History of panic disorders. 11.Known  bronchiectasis with pulmonary nodular disease - mediastinal     lymphadenopathy, since September 2007, with negative biopsy for     sarcoidosis. 12.Remote colon polys removed in 1970s, and August 2011 per endoscopy,     negative for malignancy. 13.Irritable bowel syndrome. 14.History of anemia, at least dating back to September 2007. 15.History of allergic rhinitis. 16.Remote tobacco use. 17.Hemorrhoids. 18.History of primary hyperparathyroidism, with last calcium reported     to be 10.3 as an outpatient. 19.Recent UTI.  PAST SURGICAL HISTORY: 1. 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     Eulah Pont. 2. Status post total knee arthroplasty in the right in December 2008. 3. Status post hysterectomy in the 1980s for fibroids.  ALLERGIES:  PENICILLIN, CODEINE, SULFA, OXYCODONE, NASACORT.  MEDICATIONS:  Os-Cal, vitamin D, Colace, Lovenox, Lasix, Microzide, Synthroid, milk of magnesium, Benicar, Protonix, Paxil CR, Zocor, Coumadin, Tylenol, aluminum hydroxide, Dulcolax, Flonase, Norco, Dilaudid, Claritin, Maalox, Cepacol, Robaxin, Reglan, Zofran, Senokot, and Fleet Enema.  REVIEW OF SYSTEMS:  Negative for fever, chills, night sweats, headaches, mental status changes, or vision changes.  No dysphagia.  She has chronic dyspnea on exertion, occasional cough but nonproduction of sputum at this time.  No chest pain or palpitations.  No abdominal pain, failure to thrive, or appetite changes.  There is  weight gain, increasing fatigue.  No GERD symptoms.  No nausea or vomiting.  She has intermittent periods of diarrhea and constipation secondary to IBS.  No blood in the stools or in the urine until her urinalysis showed a trace of blood back in May 2012, and during this admission, in the setting of recent UTI.  No gum or nosebleed.  No hemoptysis.  No recent change in medications.  The patient was on NSAID for pain at least dating back in May 2012.  She denies any risk  factors for hepatitis or HIV.  No quinine products.  No ice chips.  No significant amounts of caffeine.  Rest of the review of systems is negative.  FAMILY HISTORY:  Mother died with end-stage renal disease, kidney cancer, and heart disease.  Father died with MI.  She has 1 brother alive with a history of kidney cancer and 1 brother with a history of colon polyp.  SOCIAL HISTORY:  The patient is single.  One Child.  Lives in [place].  Full code.  Quit in 1978 the use of tobacco, at 1 pack a day for 10 years.  No alcohol or recreational drugs.  Last mammogram was in 2010.  Last colonoscopy as above.  PHYSICAL EXAMINATION:  GENERAL:  This is a well-developed, well- nourished 70 year old African American female in no acute distress, alert and oriented x3. VITAL SIGNS:  Blood pressure 118/69, pulse 87, respirations 16, temperature 99.5, O sats 100% in 2 liters and mild exophthalmia. HEENT:  Sclerae anicteric.  Oral cavity without thrush or lesions. NECK:  Supple.  No cervical or supraclavicular masses. LUNGS:  Clear to auscultation bilaterally.  No axillary masses. CARDIOVASCULAR:  Regular rate and rhythm without murmurs, rubs, orgallops. ABDOMEN:  Soft, nontender.  Bowel sounds x4.  No hepatosplenomegaly. GU AND RECTAL:  Deferred. EXTREMITIES:  No clubbing or cyanosis.  Trace of left lower extremity edema.  The left lower extremity is immobilized after surgery with a sling.  No inguinal masses. SKIN:  Without lesions, bruising, or petechial rash other than the obvious lesion postoperatively. BREASTS:  Not examined. NEURO:  Nonfocal.  LABORATORY DATA:  Hemoglobin 7.6, hematocrit 23.3, white count 6.1, platelets 46,000, MCV 78.2,  PTT 28, PT 15.3, INR 1.18.  Sodium 136, potassium 3.6, BUN 12, creatinine 0.89, glucose 107, total bilirubin 0.3, alkaline phosphatase 117, AST 13, ALT 11, total protein 7.2, albumin 3.8, calcium 9.4.  UA shows trace of blood and small leukocytes. ANA is  pending that is DAT that is positive.  Indirect Coombs is positive.  Smear is pending.  Chest x-ray without acute findings.  IMPRESSION AND PLAN:  Dr. Arline Asp has seen and evaluated the patient, and discussed the case with Dr. Betti Cruz and Dr. Eulah Pont.  This is a 70 year old woman, found to have thrombocytopenia in the setting of recent hospitalization and surgery.  She is status post left total knee replacement on July 03, 2011.  Her platelet count this a.m. is 46,000. Of note, preoperatively was 64,000.  In May and June of this year, the platelet count was about 250,000.  She was requested to be seen regarding these abnormalities.  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  To rule out platelet clumping, need to check a citrate to platelet count.  Also, check for lupus.  We will need to rule out autoimmune hemolytic anemia, will check her retic count, LDH, haptoglobin.  It is alright  to continue with Lovenox and Coumadin for now to prevent postoperative DVT, etc.  Awaiting the lab results, for further recommendations.  The use of steroids is to be decided, as well as the use of platelet transfusion, for IVIG.  Thank you very much for allowing [place] the opportunity to participate in the care of [name].     Marlowe Kays, PA-C   ______________________________ Samul Dada, M.D.    SW/MEDQ  D:  07/05/2011  T:  07/05/2011  Job:  [id number]  cc:   Dorisann Frames, M.D. Colon Flattery. Gery Pray, MD Loreta Ave, M.D. Dr. Hyman Hopes  Electronically Signed by Marlowe Kays P.A. on 07/08/2011 09:45:14 AM Electronically Signed by Kimberlee Nearing M.D. on 08/19/2011 09:59:07 PM

## 2011-08-20 LAB — URINALYSIS, ROUTINE W REFLEX MICROSCOPIC
Ketones, ur: NEGATIVE
Nitrite: NEGATIVE
Protein, ur: NEGATIVE
Urobilinogen, UA: 1

## 2011-08-20 LAB — CBC
MCHC: 32.5
MCV: 72.8 — ABNORMAL LOW
RBC: 4.77

## 2011-08-20 LAB — TYPE AND SCREEN
ABO/RH(D): B NEG
Antibody Screen: POSITIVE
DAT, IgG: NEGATIVE
Donor AG Type: NEGATIVE

## 2011-08-20 LAB — COMPREHENSIVE METABOLIC PANEL
AST: 14
BUN: 13
CO2: 26
Calcium: 10.6 — ABNORMAL HIGH
Creatinine, Ser: 1.1
GFR calc Af Amer: 60 — ABNORMAL LOW
GFR calc non Af Amer: 50 — ABNORMAL LOW
Glucose, Bld: 90

## 2011-08-20 LAB — APTT: aPTT: 28

## 2011-08-20 LAB — PROTIME-INR
INR: 1
Prothrombin Time: 13.4

## 2011-08-30 ENCOUNTER — Other Ambulatory Visit (HOSPITAL_COMMUNITY): Payer: Self-pay | Admitting: Oncology

## 2011-08-30 ENCOUNTER — Encounter (HOSPITAL_BASED_OUTPATIENT_CLINIC_OR_DEPARTMENT_OTHER): Payer: Medicare Other | Admitting: Oncology

## 2011-08-30 DIAGNOSIS — D696 Thrombocytopenia, unspecified: Secondary | ICD-10-CM

## 2011-08-30 LAB — CBC WITH DIFFERENTIAL/PLATELET
BASO%: 0.3 % (ref 0.0–2.0)
EOS%: 1.1 % (ref 0.0–7.0)
MCH: 26.5 pg (ref 25.1–34.0)
MCHC: 32.1 g/dL (ref 31.5–36.0)
MCV: 82.6 fL (ref 79.5–101.0)
MONO%: 6.7 % (ref 0.0–14.0)
RDW: 16.8 % — ABNORMAL HIGH (ref 11.2–14.5)
lymph#: 2.6 10*3/uL (ref 0.9–3.3)

## 2011-09-13 ENCOUNTER — Other Ambulatory Visit (HOSPITAL_COMMUNITY): Payer: Self-pay | Admitting: Oncology

## 2011-09-13 ENCOUNTER — Encounter (HOSPITAL_BASED_OUTPATIENT_CLINIC_OR_DEPARTMENT_OTHER): Payer: Medicare Other | Admitting: Oncology

## 2011-09-13 DIAGNOSIS — D696 Thrombocytopenia, unspecified: Secondary | ICD-10-CM

## 2011-09-13 LAB — CBC WITH DIFFERENTIAL/PLATELET
BASO%: 0.3 % (ref 0.0–2.0)
Eosinophils Absolute: 0.1 10*3/uL (ref 0.0–0.5)
LYMPH%: 38.6 % (ref 14.0–49.7)
MCHC: 32.3 g/dL (ref 31.5–36.0)
MCV: 82.4 fL (ref 79.5–101.0)
MONO#: 0.5 10*3/uL (ref 0.1–0.9)
MONO%: 8 % (ref 0.0–14.0)
NEUT#: 3.2 10*3/uL (ref 1.5–6.5)
RBC: 4.14 10*6/uL (ref 3.70–5.45)
RDW: 16.3 % — ABNORMAL HIGH (ref 11.2–14.5)
WBC: 6.1 10*3/uL (ref 3.9–10.3)
nRBC: 0 % (ref 0–0)

## 2011-09-13 LAB — TECHNOLOGIST REVIEW

## 2011-09-18 ENCOUNTER — Ambulatory Visit: Payer: Medicare Other | Attending: Orthopedic Surgery | Admitting: Physical Therapy

## 2011-09-18 DIAGNOSIS — IMO0001 Reserved for inherently not codable concepts without codable children: Secondary | ICD-10-CM | POA: Insufficient documentation

## 2011-09-18 DIAGNOSIS — R262 Difficulty in walking, not elsewhere classified: Secondary | ICD-10-CM | POA: Insufficient documentation

## 2011-09-18 DIAGNOSIS — Z96659 Presence of unspecified artificial knee joint: Secondary | ICD-10-CM | POA: Insufficient documentation

## 2011-09-18 DIAGNOSIS — M25669 Stiffness of unspecified knee, not elsewhere classified: Secondary | ICD-10-CM | POA: Insufficient documentation

## 2011-09-18 DIAGNOSIS — M25569 Pain in unspecified knee: Secondary | ICD-10-CM | POA: Insufficient documentation

## 2011-09-18 DIAGNOSIS — M6281 Muscle weakness (generalized): Secondary | ICD-10-CM | POA: Insufficient documentation

## 2011-09-27 ENCOUNTER — Other Ambulatory Visit: Payer: Medicare Other | Admitting: Lab

## 2011-10-01 ENCOUNTER — Ambulatory Visit: Payer: Medicare Other | Admitting: Physical Therapy

## 2011-10-02 ENCOUNTER — Ambulatory Visit: Payer: Medicare Other | Admitting: Physical Therapy

## 2011-10-08 ENCOUNTER — Telehealth: Payer: Self-pay | Admitting: Oncology

## 2011-10-08 ENCOUNTER — Ambulatory Visit: Payer: Medicare Other | Admitting: Physical Therapy

## 2011-10-08 NOTE — Telephone Encounter (Signed)
Due to RJ leaving 11/29 appt was moved to 11/28 @ 2:30 pm. Not able to reach pt due to numbers listed are wrong or not working. S/w pt's nephew lindsey Reale @ [phone] re change w/new d/t for 11/28 @ 2:30 pm. Per Mardella Layman he would try get pt and let her know. Also per lindsey call pt @ [phone]. Was not able to reach pt but did lm re above changes. Lindsey aware.

## 2011-10-09 ENCOUNTER — Ambulatory Visit: Payer: Medicare Other | Admitting: Oncology

## 2011-10-09 ENCOUNTER — Other Ambulatory Visit: Payer: Medicare Other

## 2011-10-10 ENCOUNTER — Other Ambulatory Visit: Payer: Medicare Other | Admitting: Lab

## 2011-10-10 ENCOUNTER — Ambulatory Visit: Payer: Medicare Other | Admitting: Physician Assistant

## 2011-10-11 ENCOUNTER — Ambulatory Visit: Payer: Medicare Other | Admitting: Physical Therapy

## 2011-10-11 ENCOUNTER — Encounter: Payer: Medicare Other | Admitting: Physical Therapy

## 2011-10-15 ENCOUNTER — Ambulatory Visit: Payer: Medicare Other | Attending: Orthopedic Surgery

## 2011-10-15 DIAGNOSIS — Z96659 Presence of unspecified artificial knee joint: Secondary | ICD-10-CM | POA: Insufficient documentation

## 2011-10-15 DIAGNOSIS — M6281 Muscle weakness (generalized): Secondary | ICD-10-CM | POA: Insufficient documentation

## 2011-10-15 DIAGNOSIS — M25669 Stiffness of unspecified knee, not elsewhere classified: Secondary | ICD-10-CM | POA: Insufficient documentation

## 2011-10-15 DIAGNOSIS — IMO0001 Reserved for inherently not codable concepts without codable children: Secondary | ICD-10-CM | POA: Insufficient documentation

## 2011-10-15 DIAGNOSIS — R262 Difficulty in walking, not elsewhere classified: Secondary | ICD-10-CM | POA: Insufficient documentation

## 2011-10-15 DIAGNOSIS — M25569 Pain in unspecified knee: Secondary | ICD-10-CM | POA: Insufficient documentation

## 2011-10-18 ENCOUNTER — Ambulatory Visit: Payer: Medicare Other | Admitting: Physical Therapy

## 2011-10-23 ENCOUNTER — Ambulatory Visit: Payer: Medicare Other | Admitting: Physical Therapy

## 2011-10-23 ENCOUNTER — Telehealth: Payer: Self-pay | Admitting: Nurse Practitioner

## 2011-10-23 NOTE — Telephone Encounter (Signed)
Pt called to reschedule her appt.  Stated she could not receive return call and would call back tomorrow.

## 2011-10-25 ENCOUNTER — Ambulatory Visit: Payer: Medicare Other | Admitting: Physical Therapy

## 2011-10-29 ENCOUNTER — Encounter: Payer: Medicare Other | Admitting: Physical Therapy

## 2011-10-31 ENCOUNTER — Ambulatory Visit: Payer: Medicare Other | Admitting: Physical Therapy

## 2011-11-13 ENCOUNTER — Ambulatory Visit: Payer: Medicare Other | Admitting: Rehabilitation

## 2011-11-14 ENCOUNTER — Ambulatory Visit: Payer: Medicare Other | Attending: Orthopedic Surgery

## 2011-11-14 DIAGNOSIS — M25569 Pain in unspecified knee: Secondary | ICD-10-CM | POA: Insufficient documentation

## 2011-11-14 DIAGNOSIS — R262 Difficulty in walking, not elsewhere classified: Secondary | ICD-10-CM | POA: Insufficient documentation

## 2011-11-14 DIAGNOSIS — Z96659 Presence of unspecified artificial knee joint: Secondary | ICD-10-CM | POA: Insufficient documentation

## 2011-11-14 DIAGNOSIS — M25669 Stiffness of unspecified knee, not elsewhere classified: Secondary | ICD-10-CM | POA: Insufficient documentation

## 2011-11-14 DIAGNOSIS — M6281 Muscle weakness (generalized): Secondary | ICD-10-CM | POA: Insufficient documentation

## 2011-11-14 DIAGNOSIS — IMO0001 Reserved for inherently not codable concepts without codable children: Secondary | ICD-10-CM | POA: Insufficient documentation

## 2011-11-15 ENCOUNTER — Ambulatory Visit: Payer: Medicare Other | Admitting: Rehabilitation

## 2011-11-19 ENCOUNTER — Ambulatory Visit: Payer: Medicare Other | Admitting: Rehabilitation

## 2011-11-21 ENCOUNTER — Ambulatory Visit: Payer: Medicare Other | Admitting: Rehabilitation

## 2011-11-25 ENCOUNTER — Ambulatory Visit: Payer: Medicare Other | Admitting: Rehabilitation

## 2011-11-27 ENCOUNTER — Ambulatory Visit: Payer: Medicare Other | Admitting: Physical Therapy

## 2011-12-02 ENCOUNTER — Encounter: Payer: Medicare Other | Admitting: Rehabilitation

## 2011-12-04 ENCOUNTER — Ambulatory Visit: Payer: Medicare Other | Admitting: Rehabilitation

## 2011-12-09 ENCOUNTER — Ambulatory Visit: Payer: Medicare Other | Admitting: Rehabilitation

## 2011-12-11 ENCOUNTER — Ambulatory Visit: Payer: Medicare Other | Admitting: Rehabilitation

## 2011-12-16 ENCOUNTER — Ambulatory Visit: Payer: Medicare Other | Attending: Orthopedic Surgery | Admitting: Rehabilitation

## 2011-12-16 DIAGNOSIS — M25669 Stiffness of unspecified knee, not elsewhere classified: Secondary | ICD-10-CM | POA: Insufficient documentation

## 2011-12-16 DIAGNOSIS — IMO0001 Reserved for inherently not codable concepts without codable children: Secondary | ICD-10-CM | POA: Insufficient documentation

## 2011-12-16 DIAGNOSIS — M25569 Pain in unspecified knee: Secondary | ICD-10-CM | POA: Insufficient documentation

## 2011-12-16 DIAGNOSIS — R262 Difficulty in walking, not elsewhere classified: Secondary | ICD-10-CM | POA: Insufficient documentation

## 2011-12-16 DIAGNOSIS — M6281 Muscle weakness (generalized): Secondary | ICD-10-CM | POA: Insufficient documentation

## 2011-12-16 DIAGNOSIS — Z96659 Presence of unspecified artificial knee joint: Secondary | ICD-10-CM | POA: Insufficient documentation

## 2011-12-18 ENCOUNTER — Ambulatory Visit: Payer: Medicare Other | Admitting: Physical Therapy

## 2011-12-19 ENCOUNTER — Ambulatory Visit: Payer: Medicare Other | Admitting: Rehabilitation

## 2011-12-22 ENCOUNTER — Other Ambulatory Visit: Payer: Self-pay | Admitting: Gastroenterology

## 2011-12-24 ENCOUNTER — Ambulatory Visit: Payer: Medicare Other | Admitting: Rehabilitation

## 2011-12-27 ENCOUNTER — Ambulatory Visit: Payer: Medicare Other | Admitting: Physical Therapy

## 2011-12-31 ENCOUNTER — Encounter: Payer: Medicare Other | Admitting: Rehabilitation

## 2012-01-02 ENCOUNTER — Encounter: Payer: Medicare Other | Admitting: Rehabilitation

## 2012-04-16 ENCOUNTER — Telehealth: Payer: Self-pay | Admitting: Emergency Medicine

## 2012-04-16 MED ORDER — LORATADINE 10 MG PO TABS: 10.0000 mg | ORAL_TABLET | Freq: Every day | ORAL | Status: DC

## 2012-04-16 NOTE — Telephone Encounter (Signed)
Spoke with pt and notified that rx was sent to pharm and to keep appt for additional refills. Pt verbalized understanding and states nothing further needed.

## 2012-06-04 ENCOUNTER — Encounter: Payer: Self-pay | Admitting: Emergency Medicine

## 2012-06-04 ENCOUNTER — Ambulatory Visit (INDEPENDENT_AMBULATORY_CARE_PROVIDER_SITE_OTHER): Payer: Medicare Other | Admitting: Emergency Medicine

## 2012-06-04 VITALS — BP 128/86 | HR 72 | Temp 97.8°F | Ht 65.5 in | Wt 186.8 lb

## 2012-06-04 DIAGNOSIS — J309 Allergic rhinitis, unspecified: Secondary | ICD-10-CM

## 2012-06-04 DIAGNOSIS — J479 Bronchiectasis, uncomplicated: Secondary | ICD-10-CM

## 2012-06-04 NOTE — Progress Notes (Signed)
[name] is a 71 year old woman with a history of hypertension, hypercholesterolemia, hypothyroidism, allergic rhinitis, and renal insufficiency followed by Dr. Elvis Coil. I saw her in 2007-8 for pulmonary nodules, mediastinal LAD and basilar bronchiectasis on CT scan. Bx of an axillary node showed no evidence for sarcoidosis.   Re-establishing care 03/22/10 -- Had an URI in Feb-March 2011, also some allergies. Used claritin, currently off as her symptoms improved. Still feeling a bit fatigued - being followed by Dr Hyman Hopes for anemia and hypokalemia, planning to GI eval for anemia. Reports continued globus sensation. Clears her throat and prod of clear to yellow. Has seen a streak of red in it before. No real cough, just throat clearing.   ROV 04/06/10 -- returns to discuss her bronchiectasis and cough. We started claritin last time, didn't need the flonase. Cough is better, globus sensation is better but not gone completely. Seems to be resolving. Reviewed CT scan of the chest - no change in bronchiectasis.   ROV 05/29/11 -- hx bronchiectasis and cough, allergies. Also followed for HTN, renal insufficiency. Planning for L TKR w Dr Eulah Pont, scheduled for August 22. Her cough is stable - seems to bother her when exposed to the [facility]. No flares, no abx, no prednisone for breathing.   ROV 06/04/12 -- hx bronchiectasis and cough, allergies.  Since last time she has had successful L knee sgy. She is using loratadine during the spring and fall. Still gets cough when in the [facility] or heat. No flares, no pred or abx.    Filed Vitals:   06/04/12 1055  BP: 128/86  Pulse: 72  Temp: 97.8 F (36.6 C)   Gen: Pleasant, well-nourished, in no distress,  normal affect  ENT: No lesions,  mouth clear,  oropharynx clear, no postnasal drip  Neck: No JVD, no TMG, no carotid bruits  Lungs: No use of accessory muscles, no dullness to percussion, clear without rales or rhonchi  Cardiovascular: RRR, heart sounds normal, no  murmur or gallops, no peripheral edema  Musculoskeletal: No deformities, no cyanosis or clubbing  Neuro: alert, non focal  Skin: Warm, no lesions or rashes     CT scan chest 12/19/10: Spirometry today  = normal airflow Comparison: 05/16/2011and 09/12/2008.  Findings: No pathologically enlarged mediastinal lymph nodes.  Right hilar lymph nodes are sub centimeter in size and unchanged  from 09/12/2008, as is an 11 mm short axis left axillary lymph  node. There is atherosclerotic calcification of the arterial  vasculature. Ascending aorta measures 4.0 cm (previously 3.8 cm).  Coronary artery calcification. Heart is mildly enlarged. No  pericardial effusion.  There are scattered parenchymal subpleural pulmonary nodules which  are unchanged from 09/12/2008, and are therefore considered benign.  Question mild subpleural reticulation at the lung bases. Scarring  in both lower lobes. No pleural fluid. Airway is unremarkable.  Incidental imaging of the upper abdomen shows a 1.6 cm low  attenuation lesion in the periphery of the left hepatic lobe.  There may be an exophytic low attenuation lesion off the upper pole  left kidney measuring 2 cm, which is incompletely imaged. No  worrisome lytic or sclerotic lesions.  Review of the MIP images confirms the above findings.  IMPRESSION:  1. Minimally prominent ascending aorta.  2. Coronary artery calcification.  3. Question mild basilar subpleural fibrosis.  ALLERGIC RHINITIS Stable - continue loratadine during Spring and Fall   BRONCHIECTASIS No flares. No new meds at this time - CXR next visit to follow, consider CT  scan depending on clinical status and CXR appearance.

## 2012-06-04 NOTE — Assessment & Plan Note (Signed)
Stable - continue loratadine during Spring and Fall

## 2012-06-04 NOTE — Patient Instructions (Addendum)
Please continue your loratadine (Claritin) as you are taking it.  Please follow with Dr Delton Coombes in 1 year or sooner if you have any problems or changes in your breathing.  We will perform a CXR at your next office visit.

## 2012-06-04 NOTE — Assessment & Plan Note (Signed)
No flares. No new meds at this time - CXR next visit to follow, consider CT scan depending on clinical status and CXR appearance.

## 2012-06-29 ENCOUNTER — Other Ambulatory Visit: Payer: Self-pay | Admitting: Gastroenterology

## 2012-07-29 ENCOUNTER — Other Ambulatory Visit: Payer: Self-pay | Admitting: Emergency Medicine

## 2012-07-30 ENCOUNTER — Encounter (HOSPITAL_COMMUNITY)
Admission: RE | Admit: 2012-07-30 | Discharge: 2012-07-30 | Disposition: A | Discharge status: Home / Self Care | Payer: Medicare Other | Source: Ambulatory Visit | Attending: Endocrinology | Admitting: Endocrinology

## 2012-07-30 DIAGNOSIS — Z01812 Encounter for preprocedural laboratory examination: Secondary | ICD-10-CM | POA: Insufficient documentation

## 2012-07-30 DIAGNOSIS — Z01818 Encounter for other preprocedural examination: Secondary | ICD-10-CM | POA: Insufficient documentation

## 2012-07-30 MED ORDER — ZOLEDRONIC ACID 5 MG/100ML IV SOLN
5.0000 mg | Freq: Once | INTRAVENOUS | Status: AC
  Administered 2012-07-30: 5 mg via INTRAVENOUS

## 2012-07-30 MED ORDER — ZOLEDRONIC ACID 5 MG/100ML IV SOLN
INTRAVENOUS | Status: AC
  Filled 2012-07-30: qty 100

## 2013-01-02 ENCOUNTER — Other Ambulatory Visit: Payer: Self-pay | Admitting: Gastroenterology

## 2013-06-10 ENCOUNTER — Ambulatory Visit (INDEPENDENT_AMBULATORY_CARE_PROVIDER_SITE_OTHER)
Admission: RE | Admit: 2013-06-10 | Discharge: 2013-06-10 | Disposition: A | Discharge status: Home / Self Care | Payer: Medicare Other | Source: Ambulatory Visit | Attending: Emergency Medicine | Admitting: Emergency Medicine

## 2013-06-10 ENCOUNTER — Ambulatory Visit (INDEPENDENT_AMBULATORY_CARE_PROVIDER_SITE_OTHER): Payer: Medicare Other | Admitting: Emergency Medicine

## 2013-06-10 ENCOUNTER — Encounter: Payer: Self-pay | Admitting: Emergency Medicine

## 2013-06-10 VITALS — BP 120/70 | HR 86 | Temp 97.9°F | Ht 65.5 in | Wt 203.0 lb

## 2013-06-10 DIAGNOSIS — J479 Bronchiectasis, uncomplicated: Secondary | ICD-10-CM

## 2013-06-10 NOTE — Progress Notes (Signed)
[name] is a 72 year old woman with a history of hypertension, hypercholesterolemia, hypothyroidism, allergic rhinitis, and renal insufficiency followed by Dr. Elvis Coil. I saw her in 2007-8 for pulmonary nodules, mediastinal LAD and basilar bronchiectasis on CT scan. Bx of an axillary node showed no evidence for sarcoidosis.   Re-establishing care 03/22/10 -- Had an URI in Feb-March 2011, also some allergies. Used claritin, currently off as her symptoms improved. Still feeling a bit fatigued - being followed by Dr Hyman Hopes for anemia and hypokalemia, planning to GI eval for anemia. Reports continued globus sensation. Clears her throat and prod of clear to yellow. Has seen a streak of red in it before. No real cough, just throat clearing.   ROV 04/06/10 -- returns to discuss her bronchiectasis and cough. We started claritin last time, didn't need the flonase. Cough is better, globus sensation is better but not gone completely. Seems to be resolving. Reviewed CT scan of the chest - no change in bronchiectasis.   ROV 05/29/11 -- hx bronchiectasis and cough, allergies. Also followed for HTN, renal insufficiency. Planning for L TKR w Dr Eulah Pont, scheduled for August 22. Her cough is stable - seems to bother her when exposed to the [facility]. No flares, no abx, no prednisone for breathing.   ROV 06/04/12 -- hx bronchiectasis and cough, allergies.  Since last time she has had successful L knee sgy. She is using loratadine during the spring and fall. Still gets cough when in the [facility] or heat. No flares, no pred or abx.   ROV 06/10/13 -- hx bronchiectasis, nodular disease and cough, allergies. Last CT chest  12/19/10.  She has been doing well. She has cough when the [facility] and the Heat are running. She has acid reflux - planning EGD w Dr Arlyce Dice, not currently on PPI. Some nasal congestion.   Filed Vitals:   06/10/13 0941  BP: 120/70  Pulse: 86  Temp: 97.9 F (36.6 C)  TempSrc: Oral  Height: 5' 5.5" (1.664 m)  Weight: 203 lb  (92.08 kg)  SpO2: 99%   Gen: Pleasant, well-nourished, in no distress,  normal affect  ENT: No lesions,  mouth clear,  oropharynx clear, no postnasal drip  Neck: No JVD, no TMG, no carotid bruits  Lungs: No use of accessory muscles, no dullness to percussion, clear without rales or rhonchi  Cardiovascular: RRR, heart sounds normal, no murmur or gallops, no peripheral edema  Musculoskeletal: No deformities, no cyanosis or clubbing  Neuro: alert, non focal  Skin: Warm, no lesions or rashes   CXR 06/10/13 --  Comparison: Chest x-ray 06/28/2011.  Findings: Lung volumes are normal. No acute consolidative air  space disease. No definite pleural effusions. No evidence of  pulmonary edema. No definite suspicious appearing pulmonary  nodules or masses are identified on this plain film examination.  No evidence of pulmonary edema. Borderline cardiomegaly is  unchanged. Extensive atherosclerosis of the thoracic aorta.  IMPRESSION:  1. No radiographic evidence of acute cardiopulmonary disease.  2. Atherosclerosis.  3. Borderline cardiomegaly.     CT scan chest 12/19/10: Spirometry today  = normal airflow Comparison: 05/16/2011and 09/12/2008.  Findings: No pathologically enlarged mediastinal lymph nodes.  Right hilar lymph nodes are sub centimeter in size and unchanged  from 09/12/2008, as is an 11 mm short axis left axillary lymph  node. There is atherosclerotic calcification of the arterial  vasculature. Ascending aorta measures 4.0 cm (previously 3.8 cm).  Coronary artery calcification. Heart is mildly enlarged. No  pericardial effusion.  There are scattered parenchymal subpleural pulmonary nodules which  are unchanged from 09/12/2008, and are therefore considered benign.  Question mild subpleural reticulation at the lung bases. Scarring  in both lower lobes. No pleural fluid. Airway is unremarkable.  Incidental imaging of the upper abdomen shows a 1.6 cm low  attenuation lesion  in the periphery of the left hepatic lobe.  There may be an exophytic low attenuation lesion off the upper pole  left kidney measuring 2 cm, which is incompletely imaged. No  worrisome lytic or sclerotic lesions.  Review of the MIP images confirms the above findings.  IMPRESSION:  1. Minimally prominent ascending aorta.  2. Coronary artery calcification.  3. Question mild basilar subpleural fibrosis.   BRONCHIECTASIS With minimal sputum. Still with dry cough when exposed to the [facility]. Suspect her PND - add Nasonex to loratadine. If she likes it will call us and we will send to her pharmacy. ROV 1 year

## 2013-06-10 NOTE — Assessment & Plan Note (Signed)
With minimal sputum. Still with dry cough when exposed to the [facility]. Suspect her PND - add Nasonex to loratadine. If she likes it will call us and we will send to her pharmacy. ROV 1 year

## 2013-06-10 NOTE — Patient Instructions (Addendum)
Please continue your loratadine (Claritin) daily Try using Nasonex 2 sprays each nostril daily to see if this helps with your coughing.  Get back on your reflux medication after you see Dr Arlyce Dice Follow with Dr Delton Coombes in 12 months or sooner if you have any problems

## 2013-07-22 ENCOUNTER — Telehealth: Payer: Self-pay | Admitting: Hematology and Oncology

## 2013-07-22 NOTE — Telephone Encounter (Signed)
Left pt vm in ref to np appt. °

## 2013-07-23 ENCOUNTER — Telehealth: Payer: Self-pay | Admitting: Hematology and Oncology

## 2013-07-23 NOTE — Telephone Encounter (Signed)
C/d 07/23/13 for appt. 08/02/13

## 2013-07-23 NOTE — Telephone Encounter (Signed)
S/W PT IN REF TO NP APPT. 08/02/13@10 :30 REFERRING DR WEBB DX LOW PLATELET COUNT MAILED NP PACKET

## 2013-07-23 NOTE — Telephone Encounter (Signed)
PT IS AWARE OF HER APPT

## 2013-07-28 ENCOUNTER — Telehealth: Payer: Self-pay | Admitting: Hematology and Oncology

## 2013-07-28 NOTE — Telephone Encounter (Signed)
Called pt to see if she could come in soon per pt due to transportation she needed to keep her original np appt for 9/22 @ 10:30 w/Dr. Bertis Ruddy.

## 2013-07-30 ENCOUNTER — Other Ambulatory Visit: Payer: Self-pay | Admitting: *Deleted

## 2013-08-01 ENCOUNTER — Other Ambulatory Visit: Payer: Self-pay | Admitting: Hematology and Oncology

## 2013-08-01 ENCOUNTER — Other Ambulatory Visit: Payer: Self-pay | Admitting: Emergency Medicine

## 2013-08-02 ENCOUNTER — Ambulatory Visit (HOSPITAL_BASED_OUTPATIENT_CLINIC_OR_DEPARTMENT_OTHER): Payer: PRIVATE HEALTH INSURANCE | Admitting: Hematology and Oncology

## 2013-08-02 ENCOUNTER — Other Ambulatory Visit (HOSPITAL_COMMUNITY)
Admission: RE | Admit: 2013-08-02 | Discharge: 2013-08-02 | Disposition: A | Discharge status: Home / Self Care | Payer: PRIVATE HEALTH INSURANCE | Source: Ambulatory Visit | Attending: Hematology and Oncology | Admitting: Hematology and Oncology

## 2013-08-02 ENCOUNTER — Encounter: Payer: Self-pay | Admitting: Hematology and Oncology

## 2013-08-02 ENCOUNTER — Other Ambulatory Visit: Payer: PRIVATE HEALTH INSURANCE | Admitting: Lab

## 2013-08-02 ENCOUNTER — Telehealth: Payer: Self-pay | Admitting: Hematology and Oncology

## 2013-08-02 ENCOUNTER — Ambulatory Visit (HOSPITAL_BASED_OUTPATIENT_CLINIC_OR_DEPARTMENT_OTHER): Payer: PRIVATE HEALTH INSURANCE

## 2013-08-02 ENCOUNTER — Ambulatory Visit: Payer: PRIVATE HEALTH INSURANCE

## 2013-08-02 VITALS — BP 158/77 | HR 68 | Temp 97.9°F | Resp 20 | Ht 65.5 in | Wt 201.0 lb

## 2013-08-02 DIAGNOSIS — D696 Thrombocytopenia, unspecified: Secondary | ICD-10-CM

## 2013-08-02 DIAGNOSIS — R5381 Other malaise: Secondary | ICD-10-CM

## 2013-08-02 DIAGNOSIS — D649 Anemia, unspecified: Secondary | ICD-10-CM

## 2013-08-02 DIAGNOSIS — R5383 Other fatigue: Secondary | ICD-10-CM

## 2013-08-02 HISTORY — DX: Thrombocytopenia, unspecified: D69.6

## 2013-08-02 HISTORY — DX: Other fatigue: R53.83

## 2013-08-02 LAB — CBC WITH DIFFERENTIAL/PLATELET
Basophils Absolute: 0 10*3/uL (ref 0.0–0.1)
EOS%: 2.2 % (ref 0.0–7.0)
Eosinophils Absolute: 0.1 10*3/uL (ref 0.0–0.5)
HCT: 32.9 % — ABNORMAL LOW (ref 34.8–46.6)
HGB: 10.9 g/dL — ABNORMAL LOW (ref 11.6–15.9)
LYMPH%: 36.2 % (ref 14.0–49.7)
MCH: 24.6 pg — ABNORMAL LOW (ref 25.1–34.0)
MCHC: 33.1 g/dL (ref 31.5–36.0)
MONO#: 0.3 10*3/uL (ref 0.1–0.9)
NEUT#: 2.5 10*3/uL (ref 1.5–6.5)
NEUT%: 55.2 % (ref 38.4–76.8)
Platelets: 33 10*3/uL — ABNORMAL LOW (ref 145–400)
RDW: 16.7 % — ABNORMAL HIGH (ref 11.2–14.5)
WBC: 4.5 10*3/uL (ref 3.9–10.3)
lymph#: 1.6 10*3/uL (ref 0.9–3.3)

## 2013-08-02 LAB — IRON AND TIBC CHCC
Iron: 42 ug/dL (ref 41–142)
TIBC: 246 ug/dL (ref 236–444)
UIBC: 205 ug/dL (ref 120–384)

## 2013-08-02 LAB — FERRITIN CHCC: Ferritin: 78 ng/ml (ref 9–269)

## 2013-08-02 NOTE — Progress Notes (Signed)
[facility] OFFICE CONSULT NOTE  No PCP Per Patient [address] [place] [address] No chief complaint on file.   DIAGNOSIS: Anemia and thrombocytopenia  INTERVAL HISTORY:   [name] 72 y.o. female is being referred here because of abnormal CBC. I had the opportunity to review her CBC in the electronic visit in and his most recent blood work that was collected. Back in 2012, she was noted to have thrombocytopenia which subsequently resolved spontaneously. On 04/27/2013 repeat CBC showed white count of 5.4 hemoglobin 11.1 MCV 75, 223. On 07/09/2013 repeat CBC showed white count of 4.5 hemoglobin 11.1 MCV of 73 and platelet count 51,000. Complete metabolic panel were within normal limits apart from mild hypercalcemia. Persistent chronic kidney insufficiency with a creatinine of 1.3. The patient herself is asymptomatic. 2 to complain of mild easy bruising recently but no spontaneous bleeding such as epistaxis hematuria or hematochezia. She has not been taking any new medications nor over-the-counter supplement. Denies any abnormal skin rashes or new joint swelling. According to the patient in 2004 when she had her knee surgery she did receive blood transfusions but has not had any brooks since then. One other complaints she has significant fatigue. Of note the patient had a history of hypothyroidism and currently on supplement.  MEDICAL HISTORY:  Past Medical History  Diagnosis Date  . Hypertension   . History of Graves' disease   . Hypercalcemia   . Allergic rhinitis   . Murmur, heart   . Back pain   . Renal insufficiency   . Dyslipidemia   . Osteoarthritis, knee   . Bronchiectasis   . Thoracic aortic aneurysm   . Hx of colonic polyp   . Thrombocytopenia, unspecified 08/02/2013  . Fatigue 08/02/2013    SURGICAL HISTORY: Past Surgical History  Procedure Laterality Date  . Vesicovaginal fistula closure w/ tah      b/c fibroids  . Knee arthroplasty  10/2007     Right    SOCIAL HISTORY: History   Social History  . Marital Status: Single    Spouse Name: N/A    Number of Children: N/A  . Years of Education: N/A   Occupational History  . Not on file.   Social History Main Topics  . Smoking status: Former Smoker -- 1.00 packs/day for 10 years    Types: Cigarettes    Quit date: 11/11/1976  . Smokeless tobacco: Never Used  . Alcohol Use: No  . Drug Use: No  . Sexual Activity: Not on file   Other Topics Concern  . Not on file   Social History Narrative  . No narrative on file    FAMILY HISTORY: Family History  Problem Relation Age of Onset  . Cancer Brother 60    kidney ca, prostate and colon    ALLERGIES:  is allergic to codeine; oxycodone; penicillins; sulfonamide derivatives; tramadol hcl; and triamcinolone.  MEDICATIONS: Current outpatient prescriptions:acetaminophen (TYLENOL) 325 MG tablet, Take 650 mg by mouth every 6 (six) hours as needed.  , Disp: , Rfl: ;  amLODipine (NORVASC) 5 MG tablet, Take 5 mg by mouth daily., Disp: , Rfl: ;  aspirin 81 MG tablet, Take 81 mg by mouth daily.  , Disp: , Rfl: ;  citalopram (CELEXA) 20 MG tablet, Take 20 mg by mouth daily., Disp: , Rfl: ;  furosemide (LASIX) 20 MG tablet, , Disp: , Rfl:  levothyroxine (SYNTHROID, LEVOTHROID) 100 MCG tablet, Take 100 mcg by mouth daily.  , Disp: ,  Rfl: ;  loratadine (CLARITIN) 10 MG tablet, TAKE 1 TABLET (10 MG TOTAL) BY MOUTH DAILY., Disp: 30 tablet, Rfl: 9;  losartan (COZAAR) 100 MG tablet, Take 100 mg by mouth daily., Disp: , Rfl: ;  lovastatin (MEVACOR) 40 MG tablet, Take 40 mg by mouth at bedtime.  , Disp: , Rfl: ;  senna (SENOKOT) 8.6 MG tablet, Take 1 tablet by mouth daily., Disp: , Rfl:  vitamin D, CHOLECALCIFEROL, 400 UNITS tablet, Take 400 Units by mouth daily.  , Disp: , Rfl:   REVIEW OF SYSTEMS:   Constitutional: Denies fevers, chills or abnormal weight loss Eyes: Denies blurriness of vision Ears, nose, mouth, throat, and face: Denies  mucositis or sore throat Respiratory: Denies cough, dyspnea or wheezes Cardiovascular: Denies palpitation, chest discomfort or lower extremity swelling Gastrointestinal:  Denies nausea, heartburn or change in bowel habits Skin: Denies abnormal skin rashes Lymphatics: Denies new lymphadenopathy or easy bruising Neurological:Denies numbness, tingling or new weaknesses Behavioral/Psych: Mood is stable, no new changes  All other systems were reviewed with the patient and are negative.  PHYSICAL EXAMINATION: ECOG PERFORMANCE STATUS: 1 - Symptomatic but completely ambulatory  Filed Vitals:   08/02/13 1035  BP: 158/77  Pulse: 68  Temp: 97.9 F (36.6 C)  Resp: 20   Filed Weights   08/02/13 1035  Weight: 201 lb (91.173 kg)    GENERAL:alert, no distress and comfortable SKIN: skin color, texture, turgor are normal, some bruises but no evidence of petechiae rash EYES: normal, Conjunctiva are pink and non-injected, sclera clear OROPHARYNX:no exudate, no erythema and lips, buccal mucosa, and tongue normal  NECK: supple, thyroid normal size, non-tender, without nodularity LYMPH:  no palpable lymphadenopathy in the cervical, axillary or inguinal LUNGS: clear to auscultation and percussion with normal breathing effort HEART: regular rate & rhythm and no murmurs and no lower extremity edema ABDOMEN:abdomen soft, non-tender and normal bowel sounds Musculoskeletal:no cyanosis of digits and no clubbing  NEURO: alert & oriented x 3 with fluent speech, no focal motor/sensory deficits  LABORATORY DATA:  I have reviewed the data as listed Recent Results (from the past 2160 hour(s))  CBC WITH DIFFERENTIAL     Status: Abnormal   Collection Time    08/02/13 11:41 AM      Result Value Range   WBC 4.5  3.9 - 10.3 10e3/uL   NEUT# 2.5  1.5 - 6.5 10e3/uL   HGB 10.9 (*) 11.6 - 15.9 g/dL   HCT 16.1 (*) 09.6 - 04.5 %   Platelets 33 Platelet count consistent in citrate (*) 145 - 400 10e3/uL   MCV 74.3  (*) 79.5 - 101.0 fL   MCH 24.6 (*) 25.1 - 34.0 pg   MCHC 33.1  31.5 - 36.0 g/dL   RBC 4.09  8.11 - 9.14 10e6/uL   RDW 16.7 (*) 11.2 - 14.5 %   lymph# 1.6  0.9 - 3.3 10e3/uL   MONO# 0.3  0.1 - 0.9 10e3/uL   Eosinophils Absolute 0.1  0.0 - 0.5 10e3/uL   Basophils Absolute 0.0  0.0 - 0.1 10e3/uL   NEUT% 55.2  38.4 - 76.8 %   LYMPH% 36.2  14.0 - 49.7 %   MONO% 6.0  0.0 - 14.0 %   EOS% 2.2  0.0 - 7.0 %   BASO% 0.4  0.0 - 2.0 %   nRBC 0  0 - 0 %  [facility] SMEAR     Status: None   Collection Time    08/02/13 11:41 AM  Result Value Range   Smear Result Smear Available      ASSESSMENT: Mild microcytic anemia and thrombocytopenia as to #1 microcytic anemia #2 thrombocytopenia Show very mild microcytosis is on for possible iron deficiency. The platelet count is low, very unusual as this appeared to be an acute problem. Apart from some mild bruising showed no evidence of active bleeding. I will Investigate further with some additional workup with blood work only. The present time there is no contraindication for her to remain on aspirin and I will see her back next week after the test results are available. There is no need for blood transfusion at this point If test is unrevealing, should we may have to proceed with imaging study and possibly bone marrow aspirate and biopsy. #3 fatigue Her most recent thyroid function tests were within normal limits. However a free thyroxine level to investigate further All questions were answered. The patient knows to call the clinic with any problems, questions or concerns. We can certainly see the patient much sooner if necessary. No barriers to learning was detected.  The patient and plan discussed with [facility], Ferrell Flam and she is in agreement with the aforementioned.  I spent 25 minutes counseling the patient face to face. The total time spent in the appointment was 40 minutes and more than 50% was on counseling.     [facility], Tiburcio Linder, MD 08/02/2013 12:07  PM

## 2013-08-02 NOTE — Telephone Encounter (Signed)
, °

## 2013-08-03 LAB — VITAMIN B12: Vitamin B-12: 454 pg/mL (ref 211–911)

## 2013-08-03 LAB — FOLATE: Folate: 11.4 ng/mL

## 2013-08-03 LAB — ANA: Anti Nuclear Antibody(ANA): NEGATIVE

## 2013-08-03 LAB — T4, FREE: Free T4: 1.17 ng/dL (ref 0.80–1.80)

## 2013-08-04 ENCOUNTER — Other Ambulatory Visit: Payer: Self-pay | Admitting: Hematology and Oncology

## 2013-08-04 ENCOUNTER — Ambulatory Visit (HOSPITAL_BASED_OUTPATIENT_CLINIC_OR_DEPARTMENT_OTHER): Payer: PRIVATE HEALTH INSURANCE | Admitting: Hematology and Oncology

## 2013-08-04 ENCOUNTER — Telehealth: Payer: Self-pay | Admitting: Hematology and Oncology

## 2013-08-04 ENCOUNTER — Encounter: Payer: Self-pay | Admitting: Hematology and Oncology

## 2013-08-04 ENCOUNTER — Other Ambulatory Visit: Payer: Self-pay | Admitting: Emergency Medicine

## 2013-08-04 ENCOUNTER — Ambulatory Visit (HOSPITAL_BASED_OUTPATIENT_CLINIC_OR_DEPARTMENT_OTHER): Payer: PRIVATE HEALTH INSURANCE | Admitting: Lab

## 2013-08-04 VITALS — BP 170/79 | HR 76 | Temp 98.2°F | Resp 20 | Ht 65.5 in | Wt 202.2 lb

## 2013-08-04 DIAGNOSIS — D696 Thrombocytopenia, unspecified: Secondary | ICD-10-CM

## 2013-08-04 DIAGNOSIS — D649 Anemia, unspecified: Secondary | ICD-10-CM

## 2013-08-04 LAB — CBC WITH DIFFERENTIAL/PLATELET
Eosinophils Absolute: 0.1 10*3/uL (ref 0.0–0.5)
MCH: 24.5 pg — ABNORMAL LOW (ref 25.1–34.0)
MCHC: 33 g/dL (ref 31.5–36.0)
MONO%: 6.9 % (ref 0.0–14.0)
Platelets: 36 10*3/uL — ABNORMAL LOW (ref 145–400)
RDW: 17.8 % — ABNORMAL HIGH (ref 11.2–14.5)
lymph#: 1.2 10*3/uL (ref 0.9–3.3)

## 2013-08-04 LAB — TECHNOLOGIST REVIEW

## 2013-08-04 LAB — HOLD TUBE, BLOOD BANK

## 2013-08-04 NOTE — Progress Notes (Signed)
[facility] OFFICE PROGRESS NOTE  No PCP Per Patient [address] [place] [address]   DIAGNOSIS: Followup on anemia and thrombocytopenia  SUMMARY OF HEMATOLOGIC HISTORY: Please see my dictation dated 2 days ago. The patient was being evaluated here for abnormal CBC INTERVAL HISTORY: [name] 72 y.o. female returns for further followup. She denies any bleeding such as epistaxis hematuria or hematochezia. She complained of feeling fatigued.  I have reviewed the past medical history, past surgical history, social history and family history with the patient and they are unchanged from previous note.  ALLERGIES:  is allergic to codeine; oxycodone; penicillins; sulfonamide derivatives; tramadol hcl; and triamcinolone.  MEDICATIONS: Current outpatient prescriptions:acetaminophen (TYLENOL) 325 MG tablet, Take 650 mg by mouth every 6 (six) hours as needed.  , Disp: , Rfl: ;  amLODipine (NORVASC) 5 MG tablet, Take 5 mg by mouth daily., Disp: , Rfl: ;  aspirin 81 MG tablet, Take 81 mg by mouth daily.  , Disp: , Rfl: ;  citalopram (CELEXA) 20 MG tablet, Take 20 mg by mouth daily., Disp: , Rfl: ;  furosemide (LASIX) 20 MG tablet, , Disp: , Rfl:  levothyroxine (SYNTHROID, LEVOTHROID) 100 MCG tablet, Take 100 mcg by mouth daily.  , Disp: , Rfl: ;  loratadine (CLARITIN) 10 MG tablet, TAKE 1 TABLET (10 MG TOTAL) BY MOUTH DAILY., Disp: 30 tablet, Rfl: 9;  losartan (COZAAR) 100 MG tablet, Take 100 mg by mouth daily., Disp: , Rfl: ;  lovastatin (MEVACOR) 40 MG tablet, Take 40 mg by mouth at bedtime.  , Disp: , Rfl: ;  senna (SENOKOT) 8.6 MG tablet, Take 1 tablet by mouth daily., Disp: , Rfl:  vitamin D, CHOLECALCIFEROL, 400 UNITS tablet, Take 400 Units by mouth daily.  , Disp: , Rfl:   REVIEW OF SYSTEMS:  All other systems were reviewed with the patient and are negative.  PHYSICAL EXAMINATION: ECOG PERFORMANCE STATUS: 1 - Symptomatic but completely ambulatory  Filed Vitals:   08/04/13 1347  BP: 170/79  Pulse: 76  Temp: 98.2 F (36.8 C)  Resp: 20   Filed Weights   08/04/13 1347  Weight: 202 lb 3.2 oz (91.717 kg)     GENERAL:alert, no distress and comfortable SKIN: skin color, texture, turgor are normal, no rashes or significant lesions NEURO: alert & oriented x 3 with fluent speech, no focal motor/sensory deficits  LABORATORY DATA:  I have reviewed the data as listed Results for orders placed in visit on 08/04/13 (from the past 48 hour(s))  CBC WITH DIFFERENTIAL     Status: Abnormal   Collection Time    08/04/13  1:19 PM      Result Value Range   WBC 4.3  3.9 - 10.3 10e3/uL   NEUT# 2.6  1.5 - 6.5 10e3/uL   HGB 10.5 (*) 11.6 - 15.9 g/dL   HCT 60.4 (*) 54.0 - 98.1 %   Platelets 36 Few Large & giant platelets (*) 145 - 400 10e3/uL   MCV 74.2 (*) 79.5 - 101.0 fL   MCH 24.5 (*) 25.1 - 34.0 pg   MCHC 33.0  31.5 - 36.0 g/dL   RBC 1.91  4.78 - 2.95 10e6/uL   RDW 17.8 (*) 11.2 - 14.5 %   lymph# 1.2  0.9 - 3.3 10e3/uL   MONO# 0.3  0.1 - 0.9 10e3/uL   Eosinophils Absolute 0.1  0.0 - 0.5 10e3/uL   Basophils Absolute 0.0  0.0 - 0.1 10e3/uL   NEUT% 60.2  38.4 - 76.8 %   LYMPH% 28.9  14.0 - 49.7 %   MONO% 6.9  0.0 - 14.0 %   EOS% 3.1  0.0 - 7.0 %   BASO% 0.9  0.0 - 2.0 %  TECHNOLOGIST REVIEW     Status: None   Collection Time    08/04/13  1:19 PM      Result Value Range   Technologist Review Few fragments and helmets      I personally reviewed the patient's peripheral blood smear today.  There was no peripheral blast.  The white blood cells are hyper segmented and red blood cells were of normal morphology. There are occasional elliptocytes. Schistocytes are seen. The platelet count is reduced with occasional large platelets and I have verified that there were no platelet clumping. ASSESSMENT: Anemia and thrombocytopenia   PLAN:  #1 anemia #2 thrombocytopenia Express my concern given the significant drop of platelet count. Workup for anemia is  unrevealing, likely represent anemia of chronic disease. With her rapidly declining platelet count, and concern about risk of bleeding. Due to presence of two abnormal cell lines, I am concerned about possible diagnosis of cancer or myelodysplastic syndrome. I recommend a bone marrow aspirate and biopsy to evaluate this further. At current platelet count level, she does not require transfusion. I discussed with the patient in great detail about the procedure with bone marrow aspirate and biopsy including risk of bleeding and she is in agreement to proceed. Should like to have her procedure done under sedation. All questions were answered. The patient knows to call the clinic with any problems, questions or concerns. We can certainly see the patient much sooner if necessary. No barriers to learning was detected.  The patient and plan discussed with [facility], Niclas Markell  and she is in agreement with the aforementioned.  I spent 22 minutes counseling the patient face to face. The total time spent in the appointment was 40 minutes and more than 50% was on counseling on her abnormal test and plan of care     [facility], Tarae Wooden, MD 08/04/2013 2:16 PM

## 2013-08-04 NOTE — Telephone Encounter (Signed)
gv adn printeda ppt sched and avs for pt for SEpt and OCT

## 2013-08-04 NOTE — Progress Notes (Signed)
BMBX set up with short stay at [facility], Friday @ 8:00 am. Patient to arrive at admitting at 7:00 am, NPO after midnight Thursday and must have a driver. Nephew wrote down instructions. Spoke with carter in flow cytometry, he will assist with BMBX.

## 2013-08-04 NOTE — Telephone Encounter (Signed)
lmonvm for pt re appt today for lb/NG @ 1:30pm. Also called pt's contact per Mardella Layman (not in) and s/w his sister who says she will give him the message re pt's appt today.

## 2013-08-05 ENCOUNTER — Telehealth: Payer: Self-pay | Admitting: Hematology and Oncology

## 2013-08-05 NOTE — Telephone Encounter (Signed)
MOVED 10/9 LB/NG TO 12PM DUE TO CALL DAY. S/W PT SHE IS AWARE.

## 2013-08-06 ENCOUNTER — Other Ambulatory Visit: Payer: Self-pay | Admitting: Hematology and Oncology

## 2013-08-06 ENCOUNTER — Ambulatory Visit (HOSPITAL_COMMUNITY)
Admission: RE | Admit: 2013-08-06 | Discharge: 2013-08-06 | Disposition: A | Discharge status: Home / Self Care | Payer: PRIVATE HEALTH INSURANCE | Source: Ambulatory Visit | Attending: Hematology and Oncology | Admitting: Hematology and Oncology

## 2013-08-06 ENCOUNTER — Encounter (HOSPITAL_COMMUNITY): Payer: Self-pay

## 2013-08-06 ENCOUNTER — Telehealth: Payer: Self-pay | Admitting: Hematology and Oncology

## 2013-08-06 VITALS — BP 149/85 | HR 66 | Temp 98.2°F | Resp 18 | Ht 65.5 in | Wt 202.0 lb

## 2013-08-06 DIAGNOSIS — M25551 Pain in right hip: Secondary | ICD-10-CM

## 2013-08-06 DIAGNOSIS — D696 Thrombocytopenia, unspecified: Secondary | ICD-10-CM | POA: Insufficient documentation

## 2013-08-06 DIAGNOSIS — D649 Anemia, unspecified: Secondary | ICD-10-CM

## 2013-08-06 DIAGNOSIS — D509 Iron deficiency anemia, unspecified: Secondary | ICD-10-CM | POA: Insufficient documentation

## 2013-08-06 LAB — CBC
Hemoglobin: 11.1 g/dL — ABNORMAL LOW (ref 12.0–15.0)
MCH: 24.5 pg — ABNORMAL LOW (ref 26.0–34.0)
MCHC: 33.3 g/dL (ref 30.0–36.0)
MCV: 73.5 fL — ABNORMAL LOW (ref 78.0–100.0)
Platelets: 5 10*3/uL — CL (ref 150–400)
RBC: 4.53 MIL/uL (ref 3.87–5.11)
WBC: 4 10*3/uL (ref 4.0–10.5)

## 2013-08-06 MED ORDER — ACETAMINOPHEN 325 MG PO TABS
650.0000 mg | ORAL_TABLET | Freq: Four times a day (QID) | ORAL | Status: AC | PRN
  Administered 2013-08-06: 650 mg via ORAL

## 2013-08-06 MED ORDER — ACETAMINOPHEN 325 MG PO TABS
650.0000 mg | ORAL_TABLET | Freq: Once | ORAL | Status: DC
  Filled 2013-08-06: qty 2

## 2013-08-06 MED ORDER — MIDAZOLAM HCL 10 MG/2ML IJ SOLN
10.0000 mg | Freq: Once | INTRAMUSCULAR | Status: DC
  Filled 2013-08-06: qty 2

## 2013-08-06 MED ORDER — SODIUM CHLORIDE 0.9 % IV SOLN
Freq: Once | INTRAVENOUS | Status: AC
  Administered 2013-08-06: 08:00:00 via INTRAVENOUS

## 2013-08-06 MED ORDER — MIDAZOLAM HCL 5 MG/5ML IJ SOLN
INTRAMUSCULAR | Status: AC | PRN
  Administered 2013-08-06 (×2): 2 mg via INTRAVENOUS
  Administered 2013-08-06 (×2): 1 mg via INTRAVENOUS

## 2013-08-06 MED ORDER — MIDAZOLAM HCL 2 MG/2ML IJ SOLN
INTRAMUSCULAR | Status: AC | PRN
  Administered 2013-08-06: 1 mg via INTRAVENOUS

## 2013-08-06 NOTE — ED Notes (Signed)
Patient denies pain and is resting comfortably.  

## 2013-08-06 NOTE — ED Notes (Signed)
Pt placed supine in bed with towel to site for pressure. HOB 45 degrees and pt eating cheese crackers and cola. Denies any pain

## 2013-08-06 NOTE — Sedation Documentation (Signed)
Medication dose calculated and verified for: VERSED 7 mg IV

## 2013-08-06 NOTE — ED Notes (Signed)
Platelets have infused. Dressing on right post iliac area replaced. No increase , no oozing when dressing removed. Pt ambulated to BR with cane and minimal assist and tolerated this well. No bleeding noted on dressing at site after ambulation

## 2013-08-06 NOTE — ED Notes (Signed)
Patient is resting comfortably. 

## 2013-08-06 NOTE — ED Notes (Signed)
Oozing on dressing has increased by 25% marked and timed. Pt remains supine with towel to site for pressure

## 2013-08-06 NOTE — Telephone Encounter (Signed)
lvm for pt regarding to monday appts.

## 2013-08-06 NOTE — ED Notes (Addendum)
Procedure ends. Pt remains prone. Pressure dressing with gauze and hypafix right post iliac area

## 2013-08-06 NOTE — ED Notes (Signed)
Procedure site remains CDI Pt still prone position and resting comfortably

## 2013-08-06 NOTE — ED Notes (Signed)
Dr Bertis Ruddy here to check on paitne and aware that tentative time of arrival for platelets is 1200. Dressing at procedure site continues to ooze and is now 1/2 saturated. Dressing replaced with fresh sterile 4x4 folded and hypafix

## 2013-08-06 NOTE — Progress Notes (Signed)
Brief examination was performed. ENT: adequate airway clearance Heart: regular rate and rhythm.No Murmurs Lungs: clear to auscultation, no wheezes, normal respiratory effort  Bone Marrow Biopsy and Aspiration Procedure Note   Informed consent was obtained and potential risks including bleeding, infection and pain were reviewed with the patient. I verified that the patient has been fasting since midnight.  The patient's name, date of birth, identification, consent and allergies were verified prior to the start of procedure and time out was performed.  The left posterior iliac crest was chosen was the site of biopsy.  The skin was prepped with Betadine solution.   5 cc of 2% lidocaine was used to provide local anaesthesia.   6 cc of bone marrow aspirate was obtained followed by 1 inch biopsy.   The procedure was tolerated well and there were no complications.  The patient was stable at the end of the procedure.  Specimens sent for flow cytometry, cytogenetics and additional studies.  In addition to above, platelet count was noted to be very low at 5,000. I have discussed risks, benefits, and alternatives with the patient regarding platelets transfusion.  The patient expressed informed understanding of the stated plan and asked appropriate questions. Patient would like to go ahead with the stated recommendation.

## 2013-08-06 NOTE — ED Notes (Signed)
Blood on dressing size 4cm circle marked and timed. Pt sitting at 345 degrees watching TV voices no c/o

## 2013-08-06 NOTE — Progress Notes (Signed)
CRITICAL VALUE ALERT  Critical value received: platelets at 5  Date of notification: 9/26  Time of notification:  0745  Critical value read back - yes   Nurse who received alert:  Jamelle Rushing RN   MD notified (1st page):  Dr. Bertis Ruddy  Time of first page:  0750  MD notified (2nd page):  Time of second page:  Responding MD: Dr. Bertis Ruddy  Time MD responded: 0800 here in person

## 2013-08-06 NOTE — Progress Notes (Signed)
Dr Bertis Ruddy came by to check on patient.Pt had had lunch and platelets have been started

## 2013-08-06 NOTE — ED Notes (Signed)
DRESSING TO PROCEDURE SITE BLEEDING NOTED ON 1/4 OF 2X2 GAUZE MARKED WITH TIME AND DATE

## 2013-08-06 NOTE — ED Notes (Signed)
No change on dressing from marking at 1000 time( post iliac area right side)

## 2013-08-06 NOTE — ED Notes (Signed)
Platelets for infusion have been ordered. Pt has a Platelet count of "5". Blood bank states that none are available in [facility] and they are ordering them from another facility and will call when they have arrived. Dr Bertis Ruddy is award of this  Pt cannot be discharged until she has received the platelet transfusion.

## 2013-08-08 LAB — PREPARE PLATELET PHERESIS: Unit division: 0

## 2013-08-09 ENCOUNTER — Telehealth: Payer: Self-pay | Admitting: Hematology and Oncology

## 2013-08-09 ENCOUNTER — Other Ambulatory Visit (HOSPITAL_BASED_OUTPATIENT_CLINIC_OR_DEPARTMENT_OTHER): Payer: PRIVATE HEALTH INSURANCE | Admitting: Lab

## 2013-08-09 ENCOUNTER — Ambulatory Visit: Payer: PRIVATE HEALTH INSURANCE

## 2013-08-09 ENCOUNTER — Other Ambulatory Visit: Payer: Self-pay | Admitting: Hematology and Oncology

## 2013-08-09 DIAGNOSIS — D696 Thrombocytopenia, unspecified: Secondary | ICD-10-CM

## 2013-08-09 LAB — CBC WITH DIFFERENTIAL/PLATELET
EOS%: 2.2 % (ref 0.0–7.0)
Eosinophils Absolute: 0.1 10*3/uL (ref 0.0–0.5)
HCT: 33.7 % — ABNORMAL LOW (ref 34.8–46.6)
LYMPH%: 34.3 % (ref 14.0–49.7)
MCV: 74.2 fL — ABNORMAL LOW (ref 79.5–101.0)
MONO%: 5.5 % (ref 0.0–14.0)
NEUT#: 2.9 10*3/uL (ref 1.5–6.5)
Platelets: 23 10*3/uL — ABNORMAL LOW (ref 145–400)
RBC: 4.54 10*6/uL (ref 3.70–5.45)
WBC: 5.1 10*3/uL (ref 3.9–10.3)
nRBC: 0 % (ref 0–0)

## 2013-08-09 NOTE — Patient Instructions (Addendum)
Thrombocytopenia Thrombocytopenia is a condition in which there is an abnormally small number of platelets in your blood. Platelets are also called thrombocytes. Platelets are needed for blood clotting. CAUSES Thrombocytopenia is caused by:   Decreased production of platelets. This can be caused by:  Aplastic anemia in which your bone marrow quits making blood cells.  Cancer in the bone marrow.  Use of certain medicines, including chemotherapy.  Infection in the bone marrow.  Heavy alcohol consumption.  Increased destruction of platelets. This can be caused by:  Certain immune diseases.  Use of certain drugs.  Certain blood clotting disorders.  Certain inherited disorders.  Certain bleeding disorders.  Pregnancy.  Having an enlarged spleen (hypersplenism). In hypersplenism, the spleen gathers up platelets from circulation. This means the platelets are not available to help with blood clotting. The spleen can enlarge due to cirrhosis or other conditions. SYMPTOMS  The symptoms of thrombocytopenia are side effects of poor blood clotting. Some of these are:  Abnormal bleeding.  Nosebleeds.  Heavy menstrual periods.  Blood in the urine or stools.  Purpura. This is a purplish discoloration in the skin produced by small bleeding vessels near the surface of the skin.  Bruising.  A rash that may be petechial. This looks like pinpoint, purplish-red spots on the skin and mucous membranes. It is caused by bleeding from small blood vessels (capillaries). DIAGNOSIS  Your caregiver will make this diagnosis based on your exam and blood tests. Sometimes, a bone marrow study is done to look for the original cells (megakaryocytes) that make platelets. TREATMENT  Treatment depends on the cause of the condition.  Medicines may be given to help protect your platelets from being destroyed.  In some cases, a replacement (transfusion) of platelets may be required to stop or prevent  bleeding.  Sometimes, the spleen must be surgically removed. HOME CARE INSTRUCTIONS   Check the skin and linings inside your mouth for bruising or bleeding as directed by your caregiver.  Check your sputum, urine, and stool for blood as directed by your caregiver.  Do not return to any activities that could cause bumps or bruises until your caregiver says it is okay.  Take extra care not to cut yourself when shaving or when using scissors, needles, knives, and other tools.  Take extra care not to burn yourself when ironing or cooking.  Ask your caregiver if it is okay for you to drink alcohol.  Only take over-the-counter or prescription medicines as directed by your caregiver.  Notify all your caregivers, including dentists and eye doctors, about your condition. SEEK IMMEDIATE MEDICAL CARE IF:   You develop active bleeding from anywhere in your body.  You develop unexplained bruising or bleeding.  You have blood in your sputum, urine, or stool. MAKE SURE YOU:  Understand these instructions.  Will watch your condition.  Will get help right away if you are not doing well or get worse. Document Released: 10/28/2005 Document Revised: 01/20/2012 Document Reviewed: 08/30/2011 [employer] Patient Information 2014 [employer].  

## 2013-08-09 NOTE — Progress Notes (Signed)
Patient does not require transfusion per Dr. Bertis Ruddy. Patient instructed to go by scheduling to make an appointment for lab/ov/ and possible platelet transfusion on Wednesday.

## 2013-08-09 NOTE — Telephone Encounter (Signed)
Gave pt appt for MD visit on 08/11/13

## 2013-08-10 ENCOUNTER — Ambulatory Visit: Payer: PRIVATE HEALTH INSURANCE | Admitting: Hematology and Oncology

## 2013-08-10 LAB — PLATELET ANTIBODY, INDIRECT IGG

## 2013-08-11 ENCOUNTER — Encounter: Payer: Self-pay | Admitting: Gastroenterology

## 2013-08-11 ENCOUNTER — Encounter: Payer: Self-pay | Admitting: Hematology and Oncology

## 2013-08-11 ENCOUNTER — Other Ambulatory Visit (HOSPITAL_BASED_OUTPATIENT_CLINIC_OR_DEPARTMENT_OTHER): Payer: PRIVATE HEALTH INSURANCE | Admitting: Lab

## 2013-08-11 ENCOUNTER — Ambulatory Visit (HOSPITAL_BASED_OUTPATIENT_CLINIC_OR_DEPARTMENT_OTHER): Payer: PRIVATE HEALTH INSURANCE | Admitting: Hematology and Oncology

## 2013-08-11 ENCOUNTER — Telehealth: Payer: Self-pay | Admitting: Hematology and Oncology

## 2013-08-11 VITALS — BP 127/74 | HR 69 | Temp 98.2°F | Resp 18 | Ht 65.0 in | Wt 198.6 lb

## 2013-08-11 DIAGNOSIS — D509 Iron deficiency anemia, unspecified: Secondary | ICD-10-CM

## 2013-08-11 DIAGNOSIS — D696 Thrombocytopenia, unspecified: Secondary | ICD-10-CM

## 2013-08-11 DIAGNOSIS — M79609 Pain in unspecified limb: Secondary | ICD-10-CM

## 2013-08-11 DIAGNOSIS — D649 Anemia, unspecified: Secondary | ICD-10-CM

## 2013-08-11 DIAGNOSIS — D693 Immune thrombocytopenic purpura: Secondary | ICD-10-CM

## 2013-08-11 HISTORY — DX: Immune thrombocytopenic purpura: D69.3

## 2013-08-11 LAB — CBC WITH DIFFERENTIAL/PLATELET
Basophils Absolute: 0 10*3/uL (ref 0.0–0.1)
EOS%: 3.2 % (ref 0.0–7.0)
HCT: 32.5 % — ABNORMAL LOW (ref 34.8–46.6)
HGB: 10.6 g/dL — ABNORMAL LOW (ref 11.6–15.9)
LYMPH%: 37.2 % (ref 14.0–49.7)
MCH: 24.4 pg — ABNORMAL LOW (ref 25.1–34.0)
MCHC: 32.6 g/dL (ref 31.5–36.0)
MCV: 74.7 fL — ABNORMAL LOW (ref 79.5–101.0)
MONO%: 7.5 % (ref 0.0–14.0)
NEUT%: 51.7 % (ref 38.4–76.8)
RBC: 4.35 10*6/uL (ref 3.70–5.45)
RDW: 16.9 % — ABNORMAL HIGH (ref 11.2–14.5)
lymph#: 2 10*3/uL (ref 0.9–3.3)
nRBC: 0 % (ref 0–0)

## 2013-08-11 LAB — CHROMOSOME ANALYSIS, BONE MARROW

## 2013-08-11 LAB — TECHNOLOGIST REVIEW

## 2013-08-11 MED ORDER — PREDNISONE 20 MG PO TABS: ORAL_TABLET | ORAL | Status: DC

## 2013-08-11 NOTE — Telephone Encounter (Signed)
Gave pt appt for lab and MD visit for October 2014 pt will see Dr. Arlyce Dice GI for November @ [place]

## 2013-08-11 NOTE — Progress Notes (Signed)
[facility] OFFICE PROGRESS NOTE  No PCP Per Patient DIAGNOSIS: Iron deficiency anemia and thrombocytopenia, likely ITP  SUMMARY OF HEMATOLOGIC HISTORY: #1 2012, she has thrombocytopenia that resolved spontaneously. The patient also recalled taking prednisone in the past for possible diagnosis of lupus #2 04/12/2013 she had mild microcytic anemia #3 07/09/2013 she has persistent mild microcytic anemia and thrombocytopenia with a platelet count of 51,000 #4 08/02/2013 I saw the patient and recommended a bone marrow aspirate and biopsy #5 08/06/2013 I perform bone marrow aspirate and biopsy. The patient received 1 unit of platelets for severe thrombocytopenia with a platelet count of 5000 INTERVAL HISTORY: [name] 72 y.o. female returns for further followup to review the bone marrow result tests. She denies any spontaneous bleeding. No epistaxis, hematuria, or hematochezia. She complained of mild leg cramps on the left side where she had her knee operation in 2012. The pain is intermittent only started today.  I have reviewed the past medical history, past surgical history, social history and family history with the patient and they are unchanged from previous note.  ALLERGIES:  is allergic to codeine; oxycodone; penicillins; sulfonamide derivatives; tramadol hcl; and triamcinolone. further review pertaining to her history of allergy to triamcinolone was obtained. She cannot recall the exact reaction but she did take prednisone in the past with no side effects  MEDICATIONS: Current outpatient prescriptions:acetaminophen (TYLENOL) 325 MG tablet, Take 650 mg by mouth every 6 (six) hours as needed.  , Disp: , Rfl: ;  amLODipine (NORVASC) 5 MG tablet, Take 5 mg by mouth daily., Disp: , Rfl: ;  aspirin 81 MG tablet, Take 81 mg by mouth daily.  , Disp: , Rfl: ;  cholecalciferol (VITAMIN D) 400 UNITS TABS tablet, Take 400 Units by mouth daily., Disp: , Rfl:  citalopram (CELEXA) 20 MG  tablet, Take 20 mg by mouth daily., Disp: , Rfl: ;  furosemide (LASIX) 20 MG tablet, Take 20 mg by mouth daily. , Disp: , Rfl: ;  levothyroxine (SYNTHROID, LEVOTHROID) 100 MCG tablet, Take 100 mcg by mouth daily.  , Disp: , Rfl: ;  loratadine (CLARITIN) 10 MG tablet, Take 10 mg by mouth daily., Disp: , Rfl: ;  losartan (COZAAR) 100 MG tablet, Take 100 mg by mouth daily., Disp: , Rfl:  lovastatin (MEVACOR) 40 MG tablet, Take 40 mg by mouth at bedtime.  , Disp: , Rfl: ;  senna (SENOKOT) 8.6 MG tablet, Take 1 tablet by mouth daily., Disp: , Rfl: ;  predniSONE (DELTASONE) 20 MG tablet, Take 4 tablets daily with breakfast, Disp: 100 tablet, Rfl: 5  REVIEW OF SYSTEMS:   Constitutional: Denies fevers, chills or abnormal weight loss Eyes: Denies blurriness of vision Ears, nose, mouth, throat, and face: Denies mucositis or sore throat Respiratory: Denies cough, dyspnea or wheezes Cardiovascular: Denies palpitation, chest discomfort or lower extremity swelling Gastrointestinal:  Denies nausea, heartburn or change in bowel habits Skin: Denies abnormal skin rashes All other systems were reviewed with the patient and are negative.  PHYSICAL EXAMINATION: ECOG PERFORMANCE STATUS: 1 - Symptomatic but completely ambulatory  Filed Vitals:   08/11/13 0902  BP: 127/74  Pulse: 69  Temp: 98.2 F (36.8 C)  Resp: 18   Filed Weights   08/11/13 0902  Weight: 198 lb 9.6 oz (90.084 kg)     GENERAL:alert, no distress and comfortable SKIN: skin color, texture, turgor are normal, no rashes or significant lesions NEURO: alert & oriented x 3 with fluent speech, no focal motor/sensory deficits  LABORATORY DATA:  I have reviewed the data as listed Results for orders placed in visit on 08/11/13 (from the past 48 hour(s))  CBC WITH DIFFERENTIAL     Status: Abnormal   Collection Time    08/11/13  8:41 AM      Result Value Range   WBC 5.4  3.9 - 10.3 10e3/uL   NEUT# 2.8  1.5 - 6.5 10e3/uL   HGB 10.6 (*) 11.6 -  15.9 g/dL   HCT 16.1 (*) 09.6 - 04.5 %   Platelets 20 (*) 145 - 400 10e3/uL   MCV 74.7 (*) 79.5 - 101.0 fL   MCH 24.4 (*) 25.1 - 34.0 pg   MCHC 32.6  31.5 - 36.0 g/dL   RBC 4.09  8.11 - 9.14 10e6/uL   RDW 16.9 (*) 11.2 - 14.5 %   lymph# 2.0  0.9 - 3.3 10e3/uL   MONO# 0.4  0.1 - 0.9 10e3/uL   Eosinophils Absolute 0.2  0.0 - 0.5 10e3/uL   Basophils Absolute 0.0  0.0 - 0.1 10e3/uL   NEUT% 51.7  38.4 - 76.8 %   LYMPH% 37.2  14.0 - 49.7 %   MONO% 7.5  0.0 - 14.0 %   EOS% 3.2  0.0 - 7.0 %   BASO% 0.4  0.0 - 2.0 %   nRBC 0  0 - 0 %  TECHNOLOGIST REVIEW     Status: None   Collection Time    08/11/13  8:41 AM      Result Value Range   Technologist Review Helmets and Fragments       ASSESSMENT: Microcytic anemia and ITP   PLAN:  #1 ITP I reviewed the bone marrow result. Cytogenetics are still pending. Based on the appearance of her bone marrow, it is likely that patient may have ITP. I will present her case at the hematology conference next week. In the meantime I think it would be reasonable to give a trial of prednisone. I have a long discussion with patient about risks benefits side effects of prednisone. The patient recall taking in the past for possible diagnosis of lupus. I recommend she have family members around when she stopped taking prednisone just in case she does develop a reaction to it. She will come back on Friday to have another blood work and next week when she see me back to review toxicity. #2 microcytic anemia In consumption I have GI bleed when her platelet count has dropped in the recent past few weeks. I recommend another GI evaluation for EGD and colonoscopy. I will go ahead and refer her to a gastroenterologist and she is in agreement. In the meantime I recommend she take 1 oral iron supplements a day. #3 left leg pain  Is not clear to me whether she might have muscle spasm all restless leg secondary to iron deficiency. I was see how she does with the prednisone and  iron supplement and if it does not resolve we will do further workup on this. All questions were answered. The patient knows to call the clinic with any problems, questions or concerns. We can certainly see the patient much sooner if necessary. No barriers to learning was detected.  I spent 25 minutes counseling the patient face to face. The total time spent in the appointment was 40 minutes and more than 50% was on counseling.     Mekaela Azizi, MD 08/11/2013 9:29 AM

## 2013-08-13 ENCOUNTER — Other Ambulatory Visit (HOSPITAL_BASED_OUTPATIENT_CLINIC_OR_DEPARTMENT_OTHER): Payer: PRIVATE HEALTH INSURANCE | Admitting: Lab

## 2013-08-13 DIAGNOSIS — D693 Immune thrombocytopenic purpura: Secondary | ICD-10-CM

## 2013-08-13 LAB — CBC WITH DIFFERENTIAL/PLATELET
Basophils Absolute: 0 10*3/uL (ref 0.0–0.1)
EOS%: 0.3 % (ref 0.0–7.0)
HCT: 31.4 % — ABNORMAL LOW (ref 34.8–46.6)
HGB: 10.3 g/dL — ABNORMAL LOW (ref 11.6–15.9)
MCH: 24.5 pg — ABNORMAL LOW (ref 25.1–34.0)
MCHC: 32.8 g/dL (ref 31.5–36.0)
MCV: 74.8 fL — ABNORMAL LOW (ref 79.5–101.0)
MONO#: 0.3 10*3/uL (ref 0.1–0.9)
MONO%: 4.1 % (ref 0.0–14.0)
NEUT%: 72.7 % (ref 38.4–76.8)
Platelets: 78 10*3/uL — ABNORMAL LOW (ref 145–400)
RDW: 17.1 % — ABNORMAL HIGH (ref 11.2–14.5)

## 2013-08-18 ENCOUNTER — Other Ambulatory Visit: Payer: Self-pay | Admitting: Hematology and Oncology

## 2013-08-18 ENCOUNTER — Ambulatory Visit: Payer: PRIVATE HEALTH INSURANCE | Admitting: Hematology and Oncology

## 2013-08-18 DIAGNOSIS — D649 Anemia, unspecified: Secondary | ICD-10-CM

## 2013-08-18 DIAGNOSIS — D693 Immune thrombocytopenic purpura: Secondary | ICD-10-CM

## 2013-08-19 ENCOUNTER — Telehealth: Payer: Self-pay | Admitting: Hematology and Oncology

## 2013-08-19 ENCOUNTER — Ambulatory Visit (HOSPITAL_BASED_OUTPATIENT_CLINIC_OR_DEPARTMENT_OTHER): Payer: PRIVATE HEALTH INSURANCE | Admitting: Hematology and Oncology

## 2013-08-19 ENCOUNTER — Other Ambulatory Visit (HOSPITAL_BASED_OUTPATIENT_CLINIC_OR_DEPARTMENT_OTHER): Payer: PRIVATE HEALTH INSURANCE

## 2013-08-19 VITALS — BP 160/69 | HR 69 | Temp 98.1°F | Resp 20 | Ht 65.0 in | Wt 204.4 lb

## 2013-08-19 DIAGNOSIS — R05 Cough: Secondary | ICD-10-CM

## 2013-08-19 DIAGNOSIS — D649 Anemia, unspecified: Secondary | ICD-10-CM

## 2013-08-19 DIAGNOSIS — D693 Immune thrombocytopenic purpura: Secondary | ICD-10-CM

## 2013-08-19 DIAGNOSIS — D509 Iron deficiency anemia, unspecified: Secondary | ICD-10-CM

## 2013-08-19 DIAGNOSIS — R059 Cough, unspecified: Secondary | ICD-10-CM

## 2013-08-19 LAB — CBC WITH DIFFERENTIAL/PLATELET
Basophils Absolute: 0.1 10*3/uL (ref 0.0–0.1)
EOS%: 0.5 % (ref 0.0–7.0)
Eosinophils Absolute: 0.1 10*3/uL (ref 0.0–0.5)
HCT: 32.4 % — ABNORMAL LOW (ref 34.8–46.6)
HGB: 10.5 g/dL — ABNORMAL LOW (ref 11.6–15.9)
MCH: 24.5 pg — ABNORMAL LOW (ref 25.1–34.0)
MCV: 75.3 fL — ABNORMAL LOW (ref 79.5–101.0)
MONO#: 0.8 10*3/uL (ref 0.1–0.9)
NEUT#: 8.6 10*3/uL — ABNORMAL HIGH (ref 1.5–6.5)
NEUT%: 70.9 % (ref 38.4–76.8)
RBC: 4.3 10*6/uL (ref 3.70–5.45)
lymph#: 2.6 10*3/uL (ref 0.9–3.3)

## 2013-08-19 LAB — HOLD TUBE, BLOOD BANK

## 2013-08-19 NOTE — Telephone Encounter (Signed)
gv and printed appt sched and av sfo rpt for OCT and NOV.... °

## 2013-08-19 NOTE — Progress Notes (Signed)
[facility] OFFICE PROGRESS NOTE DIAGNOSIS: ITP, for further management  SUMMARY OF HEMATOLOGIC HISTORY: #1 2012, she has thrombocytopenia that resolved spontaneously. The patient also recalled taking prednisone in the past for possible diagnosis of lupus #2 04/12/2013 she had mild microcytic anemia #3 07/09/2013 she has persistent mild microcytic anemia and thrombocytopenia with a platelet count of 51,000 #4 08/02/2013 I saw the patient and recommended a bone marrow aspirate and biopsy #5 08/06/2013 I perform bone marrow aspirate and biopsy. The patient received 1 unit of platelets for severe thrombocytopenia with a platelet count of 5000 #6 08/11/13: she is started on prednisone 80 mg daily #7 08/19/13: platelet count improves to 222. We appreciated steroid taper INTERVAL HISTORY: [name] 72 y.o. female returns for further followup. She is doing well with great energy level. She has not noticed any spontaneous bruising, epistaxis, hematuria or hematochezia. She has great energy level. Previously when I saw her she had leg pain on the left but that has also resolved. She started taking 1 oral iron supplement a day and that does not cause any problem with bloating or constipation.  I have reviewed the past medical history, past surgical history, social history and family history with the patient and they are unchanged from previous note.  ALLERGIES:  is allergic to codeine; oxycodone; penicillins; sulfonamide derivatives; tramadol hcl; and triamcinolone.  MEDICATIONS: Current outpatient prescriptions:acetaminophen (TYLENOL) 325 MG tablet, Take 650 mg by mouth every 6 (six) hours as needed.  , Disp: , Rfl: ;  amLODipine (NORVASC) 5 MG tablet, Take 5 mg by mouth daily., Disp: , Rfl: ;  aspirin 81 MG tablet, Take 81 mg by mouth daily.  , Disp: , Rfl: ;  cholecalciferol (VITAMIN D) 400 UNITS TABS tablet, Take 400 Units by mouth daily., Disp: , Rfl:  citalopram (CELEXA) 20 MG tablet,  Take 20 mg by mouth daily., Disp: , Rfl: ;  furosemide (LASIX) 20 MG tablet, Take 20 mg by mouth daily. , Disp: , Rfl: ;  levothyroxine (SYNTHROID, LEVOTHROID) 100 MCG tablet, Take 100 mcg by mouth daily.  , Disp: , Rfl: ;  loratadine (CLARITIN) 10 MG tablet, Take 10 mg by mouth daily., Disp: , Rfl: ;  losartan (COZAAR) 100 MG tablet, Take 100 mg by mouth daily., Disp: , Rfl:  lovastatin (MEVACOR) 40 MG tablet, Take 40 mg by mouth at bedtime.  , Disp: , Rfl: ;  predniSONE (DELTASONE) 20 MG tablet, Take 4 tablets daily with breakfast, Disp: 100 tablet, Rfl: 5;  senna (SENOKOT) 8.6 MG tablet, Take 1 tablet by mouth as needed. , Disp: , Rfl:   REVIEW OF SYSTEMS:   Constitutional: Denies fevers, chills or abnormal weight loss All other systems were reviewed with the patient and are negative.  PHYSICAL EXAMINATION: ECOG PERFORMANCE STATUS: 0 - Asymptomatic  Filed Vitals:   08/19/13 1156  BP: 160/69  Pulse: 69  Temp: 98.1 F (36.7 C)  Resp: 20   Filed Weights   08/19/13 1156  Weight: 204 lb 6.4 oz (92.715 kg)     GENERAL:alert, no distress and comfortable NEURO: alert & oriented x 3 with fluent speech, no focal motor/sensory deficits  LABORATORY DATA:  I have reviewed the data as listed Results for orders placed in visit on 08/19/13 (from the past 48 hour(s))  CBC WITH DIFFERENTIAL     Status: Abnormal   Collection Time    08/19/13 11:28 AM      Result Value Range   WBC 12.2 (*)  3.9 - 10.3 10e3/uL   NEUT# 8.6 (*) 1.5 - 6.5 10e3/uL   HGB 10.5 (*) 11.6 - 15.9 g/dL   HCT 81.1 (*) 91.4 - 78.2 %   Platelets 222  145 - 400 10e3/uL   MCV 75.3 (*) 79.5 - 101.0 fL   MCH 24.5 (*) 25.1 - 34.0 pg   MCHC 32.5  31.5 - 36.0 g/dL   RBC 9.56  2.13 - 0.86 10e6/uL   RDW 19.0 (*) 11.2 - 14.5 %   lymph# 2.6  0.9 - 3.3 10e3/uL   MONO# 0.8  0.1 - 0.9 10e3/uL   Eosinophils Absolute 0.1  0.0 - 0.5 10e3/uL   Basophils Absolute 0.1  0.0 - 0.1 10e3/uL   NEUT% 70.9  38.4 - 76.8 %   LYMPH% 21.1  14.0 -  49.7 %   MONO% 6.8  0.0 - 14.0 %   EOS% 0.5  0.0 - 7.0 %   BASO% 0.7  0.0 - 2.0 %    ASSESSMENT: ITP, resolved on prednisone   PLAN:  #1 ITP She is doing well with prednisone. We will start to initiate steroid taper. Starting tomorrow, we reduced the prednisone to 60 mg daily. She will return in one week for recheck of the CBC and if it still normal, we can go down further to 40 mg a day. I will see her back in 2 weeks for further assessment #2 anemia This is related to iron deficiency. Cough is unknown. I recommend GI evaluation. She has an appointment pending. In the meantime we'll continue 1 oral ion supplements a day. All questions were answered. The patient knows to call the clinic with any problems, questions or concerns. We can certainly see the patient much sooner if necessary. No barriers to learning was detected.  I spent 15 minutes counseling the patient face to face. The total time spent in the appointment was 20 minutes and more than 50% was on counseling.     Mykelle Cockerell, MD 08/19/2013 12:30 PM

## 2013-08-26 ENCOUNTER — Other Ambulatory Visit (HOSPITAL_BASED_OUTPATIENT_CLINIC_OR_DEPARTMENT_OTHER): Payer: PRIVATE HEALTH INSURANCE | Admitting: Lab

## 2013-08-26 ENCOUNTER — Other Ambulatory Visit: Payer: Self-pay | Admitting: Hematology and Oncology

## 2013-08-26 DIAGNOSIS — D649 Anemia, unspecified: Secondary | ICD-10-CM

## 2013-08-26 DIAGNOSIS — D693 Immune thrombocytopenic purpura: Secondary | ICD-10-CM

## 2013-08-26 LAB — CBC WITH DIFFERENTIAL/PLATELET
Basophils Absolute: 0 10*3/uL (ref 0.0–0.1)
EOS%: 0.2 % (ref 0.0–7.0)
Eosinophils Absolute: 0 10*3/uL (ref 0.0–0.5)
HCT: 32.2 % — ABNORMAL LOW (ref 34.8–46.6)
HGB: 10.5 g/dL — ABNORMAL LOW (ref 11.6–15.9)
LYMPH%: 22.8 % (ref 14.0–49.7)
MCV: 75.5 fL — ABNORMAL LOW (ref 79.5–101.0)
MONO#: 0.5 10*3/uL (ref 0.1–0.9)
MONO%: 5.6 % (ref 0.0–14.0)
NEUT#: 6.8 10*3/uL — ABNORMAL HIGH (ref 1.5–6.5)
NEUT%: 71.1 % (ref 38.4–76.8)
Platelets: 141 10*3/uL — ABNORMAL LOW (ref 145–400)
RBC: 4.27 10*6/uL (ref 3.70–5.45)
RDW: 19.5 % — ABNORMAL HIGH (ref 11.2–14.5)

## 2013-09-02 ENCOUNTER — Encounter: Payer: Self-pay | Admitting: Hematology and Oncology

## 2013-09-02 ENCOUNTER — Other Ambulatory Visit (HOSPITAL_BASED_OUTPATIENT_CLINIC_OR_DEPARTMENT_OTHER): Payer: PRIVATE HEALTH INSURANCE | Admitting: Lab

## 2013-09-02 ENCOUNTER — Ambulatory Visit (HOSPITAL_BASED_OUTPATIENT_CLINIC_OR_DEPARTMENT_OTHER): Payer: PRIVATE HEALTH INSURANCE | Admitting: Hematology and Oncology

## 2013-09-02 ENCOUNTER — Telehealth: Payer: Self-pay | Admitting: Hematology and Oncology

## 2013-09-02 VITALS — BP 157/87 | HR 87 | Temp 97.4°F | Resp 20 | Ht 65.0 in | Wt 203.3 lb

## 2013-09-02 DIAGNOSIS — B370 Candidal stomatitis: Secondary | ICD-10-CM

## 2013-09-02 DIAGNOSIS — D649 Anemia, unspecified: Secondary | ICD-10-CM

## 2013-09-02 DIAGNOSIS — D693 Immune thrombocytopenic purpura: Secondary | ICD-10-CM

## 2013-09-02 HISTORY — DX: Candidal stomatitis: B37.0

## 2013-09-02 LAB — CBC WITH DIFFERENTIAL/PLATELET
BASO%: 0.6 % (ref 0.0–2.0)
Basophils Absolute: 0.1 10*3/uL (ref 0.0–0.1)
EOS%: 0.3 % (ref 0.0–7.0)
HCT: 34 % — ABNORMAL LOW (ref 34.8–46.6)
HGB: 11.2 g/dL — ABNORMAL LOW (ref 11.6–15.9)
LYMPH%: 14.8 % (ref 14.0–49.7)
MCH: 25 pg — ABNORMAL LOW (ref 25.1–34.0)
MCHC: 32.8 g/dL (ref 31.5–36.0)
MCV: 76 fL — ABNORMAL LOW (ref 79.5–101.0)
MONO%: 3.5 % (ref 0.0–14.0)
NEUT%: 80.8 % — ABNORMAL HIGH (ref 38.4–76.8)
Platelets: 89 10*3/uL — ABNORMAL LOW (ref 145–400)

## 2013-09-02 MED ORDER — FLUCONAZOLE 100 MG PO TABS: 100.0000 mg | ORAL_TABLET | Freq: Every day | ORAL | Status: DC

## 2013-09-02 NOTE — Progress Notes (Signed)
[facility] OFFICE PROGRESS NOTE  No PCP Per Patient DIAGNOSIS:  ITP, on prednisone  SUMMARY OF HEMATOLOGIC HISTORY: #1 2012, she has thrombocytopenia that resolved spontaneously. The patient also recalled taking prednisone in the past for possible diagnosis of lupus #2 04/12/2013 she had mild microcytic anemia #3 07/09/2013 she has persistent mild microcytic anemia and thrombocytopenia with a platelet count of 51,000 #4 08/02/2013 I saw the patient and recommended a bone marrow aspirate and biopsy #5 08/06/2013 I perform bone marrow aspirate and biopsy. The patient received 1 unit of platelets for severe thrombocytopenia with a platelet count of 5000 #6 08/11/13: she is started on prednisone 80 mg daily #7 08/19/13: platelet count improves to 222. We appreciated steroid taper INTERVAL HISTORY: [name] 72 y.o. female returns for further followup. Since her last time I saw her, we reduced the prednisone to 40 mg. She denies any recent fever, chills, night sweats or abnormal weight loss The patient denies any recent signs or symptoms of bleeding such as spontaneous epistaxis, hematuria or hematochezia. She continued to take 1 oral iron supplement a day. She complained of yeast infection in her mouth recently. Denies any dysuria.  I have reviewed the past medical history, past surgical history, social history and family history with the patient and they are unchanged from previous note.  ALLERGIES:  is allergic to codeine; oxycodone; penicillins; sulfonamide derivatives; tramadol hcl; and triamcinolone.  MEDICATIONS:  Current Outpatient Prescriptions  Medication Sig Dispense Refill  . acetaminophen (TYLENOL) 325 MG tablet Take 650 mg by mouth every 6 (six) hours as needed.        Marland Kitchen amLODipine (NORVASC) 5 MG tablet Take 5 mg by mouth daily.      Marland Kitchen aspirin 81 MG tablet Take 81 mg by mouth daily.        . cholecalciferol (VITAMIN D) 400 UNITS TABS tablet Take 400 Units by  mouth daily.      . citalopram (CELEXA) 20 MG tablet Take 20 mg by mouth daily.      . furosemide (LASIX) 20 MG tablet Take 20 mg by mouth daily.       Marland Kitchen levothyroxine (SYNTHROID, LEVOTHROID) 100 MCG tablet Take 100 mcg by mouth daily.        Marland Kitchen loratadine (CLARITIN) 10 MG tablet Take 10 mg by mouth daily.      Marland Kitchen losartan (COZAAR) 100 MG tablet Take 100 mg by mouth daily.      Marland Kitchen lovastatin (MEVACOR) 40 MG tablet Take 40 mg by mouth at bedtime.        . predniSONE (DELTASONE) 20 MG tablet Take 40 mg by mouth daily. Take 4 tablets daily with breakfast      . senna (SENOKOT) 8.6 MG tablet Take 1 tablet by mouth as needed.       . fluconazole (DIFLUCAN) 100 MG tablet Take 1 tablet (100 mg total) by mouth daily.  14 tablet  0   No current facility-administered medications for this visit.     REVIEW OF SYSTEMS:   Constitutional: Denies fevers, chills or night sweats Eyes: Denies blurriness of vision Ears, nose, mouth, throat, and face: Denies mucositis or sore throat. Evidence of thrush Respiratory: Denies cough, dyspnea or wheezes Cardiovascular: Denies palpitation, chest discomfort or lower extremity swelling Gastrointestinal:  Denies nausea, heartburn or change in bowel habits Skin: Denies abnormal skin rashes Lymphatics: Denies new lymphadenopathy or easy bruising Neurological:Denies numbness, tingling or new weaknesses Behavioral/Psych: Mood is stable, no new changes  All other systems were reviewed with the patient and are negative.  PHYSICAL EXAMINATION: ECOG PERFORMANCE STATUS: 0 - Asymptomatic  Filed Vitals:   09/02/13 1124  BP: 157/87  Pulse: 87  Temp: 97.4 F (36.3 C)  Resp: 20   Filed Weights   09/02/13 1124  Weight: 203 lb 4.8 oz (92.216 kg)    GENERAL:alert, no distress and comfortable SKIN: skin color, texture, turgor are normal, no rashes or significant lesions EYES: normal, Conjunctiva are pink and non-injected, sclera clear OROPHARYNX:no exudate, no erythema  and lips, buccal mucosa, and tongue normal . Evidence of thrush is noted NECK: supple, thyroid normal size, non-tender, without nodularity LYMPH:  no palpable lymphadenopathy in the cervical, axillary or inguinal LUNGS: clear to auscultation and percussion with normal breathing effort HEART: regular rate & rhythm and no murmurs and no lower extremity edema ABDOMEN:abdomen soft, non-tender and normal bowel sounds Musculoskeletal:no cyanosis of digits and no clubbing  NEURO: alert & oriented x 3 with fluent speech, no focal motor/sensory deficits  LABORATORY DATA:  I have reviewed the data as listed Results for orders placed in visit on 09/02/13 (from the past 48 hour(s))  CBC WITH DIFFERENTIAL     Status: Abnormal   Collection Time    09/02/13 11:03 AM      Result Value Range   WBC 10.0  3.9 - 10.3 10e3/uL   NEUT# 8.1 (*) 1.5 - 6.5 10e3/uL   HGB 11.2 (*) 11.6 - 15.9 g/dL   HCT 81.1 (*) 91.4 - 78.2 %   Platelets 89 (*) 145 - 400 10e3/uL   MCV 76.0 (*) 79.5 - 101.0 fL   MCH 25.0 (*) 25.1 - 34.0 pg   MCHC 32.8  31.5 - 36.0 g/dL   RBC 9.56  2.13 - 0.86 10e6/uL   RDW 19.9 (*) 11.2 - 14.5 %   lymph# 1.5  0.9 - 3.3 10e3/uL   MONO# 0.3  0.1 - 0.9 10e3/uL   Eosinophils Absolute 0.0  0.0 - 0.5 10e3/uL   Basophils Absolute 0.1  0.0 - 0.1 10e3/uL   NEUT% 80.8 (*) 38.4 - 76.8 %   LYMPH% 14.8  14.0 - 49.7 %   MONO% 3.5  0.0 - 14.0 %   EOS% 0.3  0.0 - 7.0 %   BASO% 0.6  0.0 - 2.0 %     ASSESSMENT:  ITP  PLAN:  #1 ITP Unfortunately, once we initiated steroid taper, her platelet count started to trend down. I have asked her to increase her prednisone back down to 60 mg. She will continue once a week repeat blood work. I will see her back in 2 weeks. If her platelet count dropped down further, she may need treatment with IVIG. #2 anemia This is related to iron deficiency. She denies any recent bleeding. GI evaluation is pending. She'll continue 1 oral ion supplements a day. #3  thrush This is related to prednisone. I gave her prescription of fluconazole. I've asked her to hold her lovastatin because of interaction. All questions were answered. The patient knows to call the clinic with any problems, questions or concerns. No barriers to learning was detected.    Alicya Bena, MD 09/02/2013 11:50 AM

## 2013-09-02 NOTE — Telephone Encounter (Signed)
gv and printed appt sched for OCT and NOV

## 2013-09-09 ENCOUNTER — Other Ambulatory Visit (HOSPITAL_BASED_OUTPATIENT_CLINIC_OR_DEPARTMENT_OTHER): Payer: PRIVATE HEALTH INSURANCE | Admitting: Lab

## 2013-09-09 DIAGNOSIS — D649 Anemia, unspecified: Secondary | ICD-10-CM

## 2013-09-09 DIAGNOSIS — D693 Immune thrombocytopenic purpura: Secondary | ICD-10-CM

## 2013-09-09 LAB — COMPREHENSIVE METABOLIC PANEL (CC13)
ALT: 11 U/L (ref 0–55)
AST: 8 U/L (ref 5–34)
BUN: 24.3 mg/dL (ref 7.0–26.0)
Calcium: 9.9 mg/dL (ref 8.4–10.4)
Creatinine: 1.3 mg/dL — ABNORMAL HIGH (ref 0.6–1.1)
Total Bilirubin: 0.53 mg/dL (ref 0.20–1.20)
Total Protein: 6.4 g/dL (ref 6.4–8.3)

## 2013-09-09 LAB — CBC WITH DIFFERENTIAL/PLATELET
Basophils Absolute: 0 10*3/uL (ref 0.0–0.1)
EOS%: 0.4 % (ref 0.0–7.0)
Eosinophils Absolute: 0 10*3/uL (ref 0.0–0.5)
HCT: 34.7 % — ABNORMAL LOW (ref 34.8–46.6)
HGB: 11.2 g/dL — ABNORMAL LOW (ref 11.6–15.9)
LYMPH%: 30.3 % (ref 14.0–49.7)
MCH: 24.8 pg — ABNORMAL LOW (ref 25.1–34.0)
MCHC: 32.2 g/dL (ref 31.5–36.0)
MCV: 76.9 fL — ABNORMAL LOW (ref 79.5–101.0)
NEUT#: 5 10*3/uL (ref 1.5–6.5)
NEUT%: 64 % (ref 38.4–76.8)
RBC: 4.51 10*6/uL (ref 3.70–5.45)
RDW: 20.6 % — ABNORMAL HIGH (ref 11.2–14.5)
lymph#: 2.4 10*3/uL (ref 0.9–3.3)

## 2013-09-13 ENCOUNTER — Ambulatory Visit (INDEPENDENT_AMBULATORY_CARE_PROVIDER_SITE_OTHER): Payer: PRIVATE HEALTH INSURANCE | Admitting: Gastroenterology

## 2013-09-13 ENCOUNTER — Encounter: Payer: Self-pay | Admitting: Gastroenterology

## 2013-09-13 DIAGNOSIS — D638 Anemia in other chronic diseases classified elsewhere: Secondary | ICD-10-CM | POA: Insufficient documentation

## 2013-09-13 DIAGNOSIS — Z86010 Personal history of colonic polyps: Secondary | ICD-10-CM

## 2013-09-13 DIAGNOSIS — K219 Gastro-esophageal reflux disease without esophagitis: Secondary | ICD-10-CM

## 2013-09-13 DIAGNOSIS — Z800 Family history of malignant neoplasm of digestive organs: Secondary | ICD-10-CM

## 2013-09-13 DIAGNOSIS — D509 Iron deficiency anemia, unspecified: Secondary | ICD-10-CM

## 2013-09-13 MED ORDER — PANTOPRAZOLE SODIUM 40 MG PO TBEC: 40.0000 mg | DELAYED_RELEASE_TABLET | Freq: Every day | ORAL | Status: DC

## 2013-09-13 MED ORDER — HYOSCYAMINE SULFATE 0.125 MG SL SUBL: 0.1250 mg | SUBLINGUAL_TABLET | SUBLINGUAL | Status: DC | PRN

## 2013-09-13 NOTE — Patient Instructions (Addendum)
You have been scheduled for an endoscopy with propofol. Please follow written instructions given to you at your visit today. If you use inhalers (even only as needed), please bring them with you on the day of your procedure. Your physician has requested that you go to www.[email or web address] and enter the access code given to you at your visit today. This web site gives a general overview about your procedure. However, you should still follow specific instructions given to you by our office regarding your preparation for the procedure.  Go to the basement to pick up IFOB kit today

## 2013-09-13 NOTE — Assessment & Plan Note (Addendum)
Symptomatic with pyrosis.  Plan to start protonix 40 mg daily

## 2013-09-13 NOTE — Progress Notes (Signed)
History of Present Illness:  72 year old Afro-American female with ITP, on prednisone, history of adenomatous colon polyps and family history of colon cancer, referred for evaluation of anemia.  A microcytic anemia was noted.  Recent hemoglobin was 11.2 and MCV 76.9.  The patient denies change in bowel habits, abdominal pain, melena or hematochezia.  She does complain of pyrosis but denies dysphagia.  She's on no gastric irritants including nonsteroidals.  Last colonoscopy 2011 demonstrated a small adenomatous polyp.  Family history is pertinent for colon cancer in a sibling.    Past Medical History  Diagnosis Date  . Hypertension   . History of Graves' disease   . Hypercalcemia   . Allergic rhinitis   . Murmur, heart   . Back pain   . Renal insufficiency   . Dyslipidemia   . Osteoarthritis, knee   . Bronchiectasis   . Thoracic aortic aneurysm   . Hx of colonic polyp   . Thrombocytopenia, unspecified 08/02/2013  . Fatigue 08/02/2013  . 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 Thrush, oral 09/02/2013  . Hyperlipidemia   . GERD (gastroesophageal reflux disease)    Past Surgical History  Procedure Laterality Date  . Abdominal hysterectomy      b/c fibroids  . Knee arthroplasty Right 10/2007  . Total knee arthroplasty Left   . Appendectomy     family history includes Breast cancer in her maternal aunt; Colon cancer in her brother; Colon polyps in her brother; Heart disease in her mother; Irritable bowel syndrome in her maternal aunt; Kidney cancer (age of onset: 7) in her brother; Kidney disease in her mother; Liver cancer in her maternal uncle; Prostate cancer in her brother. Current Outpatient Prescriptions  Medication Sig Dispense Refill  . acetaminophen (TYLENOL) 325 MG tablet Take 650 mg by mouth every 6 (six) hours as needed.        Marland Kitchen amLODipine (NORVASC) 5 MG tablet Take 5 mg by mouth daily.      Marland Kitchen aspirin 81 MG tablet Take 81 mg by mouth daily.        .  cholecalciferol (VITAMIN D) 400 UNITS TABS tablet Take 400 Units by mouth daily.      . citalopram (CELEXA) 20 MG tablet Take 20 mg by mouth daily.      . furosemide (LASIX) 20 MG tablet Take 20 mg by mouth daily.       Marland Kitchen levothyroxine (SYNTHROID, LEVOTHROID) 100 MCG tablet Take 100 mcg by mouth daily.        Marland Kitchen loratadine (CLARITIN) 10 MG tablet Take 10 mg by mouth daily.      Marland Kitchen losartan (COZAAR) 100 MG tablet Take 100 mg by mouth daily.      Marland Kitchen lovastatin (MEVACOR) 40 MG tablet Take 40 mg by mouth at bedtime.        . predniSONE (DELTASONE) 20 MG tablet Take 40 mg by mouth daily. Take 4 tablets daily with breakfast      . senna (SENOKOT) 8.6 MG tablet Take 1 tablet by mouth as needed.        No current facility-administered medications for this visit.   Allergies as of 09/13/2013 - Review Complete 09/13/2013  Allergen Reaction Noted  . Codeine  06/06/2010  . Oxycodone Nausea And Vomiting 05/29/2011  . Penicillins  06/06/2010  . Sulfonamide derivatives  06/06/2010  . Tramadol hcl  06/06/2010  . Triamcinolone      reports that she quit smoking about 36 years ago. Her smoking use  included Cigarettes. She has a 10 pack-year smoking history. She has never used smokeless tobacco. She reports that she does not drink alcohol or use illicit drugs.     Review of Systems: Pertinent positive and negative review of systems were noted in the above HPI section. All other review of systems were otherwise negative.  Vital signs were reviewed in today's medical record Physical Exam: General: Well developed , well nourished, no acute distress Skin: anicteric Head: Normocephalic and atraumatic Eyes:  sclerae anicteric, EOMI Ears: Normal auditory acuity Mouth: No deformity or lesions Neck: Supple, no masses or thyromegaly Lungs: Clear throughout to auscultation Heart: Regular rate and rhythm; no murmurs, rubs or bruits Abdomen: Soft, non tender and non distended. No masses, hepatosplenomegaly or  hernias noted. Normal Bowel sounds Rectal:deferred Musculoskeletal: Symmetrical with no gross deformities  Skin: No lesions on visible extremities Pulses:  Normal pulses noted Extremities: No clubbing, cyanosis, edema or deformities noted Neurological: Alert oriented x 4, grossly nonfocal Cervical Nodes:  No significant cervical adenopathy Inguinal Nodes: No significant inguinal adenopathy Psychological:  Alert and cooperative. Normal mood and affect

## 2013-09-13 NOTE — Assessment & Plan Note (Signed)
Plan follow-up colonoscopy 2016 

## 2013-09-13 NOTE — Assessment & Plan Note (Addendum)
Microcytic anemia probably secondary to iron deficiency.  GI bleeding source should be r/oed.  2011 colonoscopy mitigates against a colonic source.  Recommendations #1 upper endoscopy #2 Hemoccults: If positive and endoscopy is nondiagnostic I would repeat colonoscopy

## 2013-09-16 ENCOUNTER — Telehealth: Payer: Self-pay | Admitting: Hematology and Oncology

## 2013-09-16 ENCOUNTER — Other Ambulatory Visit: Payer: PRIVATE HEALTH INSURANCE

## 2013-09-16 ENCOUNTER — Ambulatory Visit (HOSPITAL_BASED_OUTPATIENT_CLINIC_OR_DEPARTMENT_OTHER): Payer: PRIVATE HEALTH INSURANCE | Admitting: Hematology and Oncology

## 2013-09-16 ENCOUNTER — Other Ambulatory Visit (HOSPITAL_BASED_OUTPATIENT_CLINIC_OR_DEPARTMENT_OTHER): Payer: PRIVATE HEALTH INSURANCE | Admitting: Lab

## 2013-09-16 VITALS — BP 174/90 | HR 103 | Temp 96.6°F | Resp 19 | Ht 65.0 in | Wt 206.7 lb

## 2013-09-16 DIAGNOSIS — E249 Cushing's syndrome, unspecified: Secondary | ICD-10-CM

## 2013-09-16 DIAGNOSIS — D509 Iron deficiency anemia, unspecified: Secondary | ICD-10-CM

## 2013-09-16 DIAGNOSIS — D649 Anemia, unspecified: Secondary | ICD-10-CM

## 2013-09-16 DIAGNOSIS — I10 Essential (primary) hypertension: Secondary | ICD-10-CM

## 2013-09-16 DIAGNOSIS — D693 Immune thrombocytopenic purpura: Secondary | ICD-10-CM

## 2013-09-16 DIAGNOSIS — T380X5A Adverse effect of glucocorticoids and synthetic analogues, initial encounter: Secondary | ICD-10-CM

## 2013-09-16 LAB — CBC WITH DIFFERENTIAL/PLATELET
Basophils Absolute: 0.1 10*3/uL (ref 0.0–0.1)
EOS%: 0.2 % (ref 0.0–7.0)
Eosinophils Absolute: 0 10*3/uL (ref 0.0–0.5)
HGB: 11.6 g/dL (ref 11.6–15.9)
LYMPH%: 19.6 % (ref 14.0–49.7)
MCH: 25.7 pg (ref 25.1–34.0)
MCHC: 33.2 g/dL (ref 31.5–36.0)
MCV: 77.2 fL — ABNORMAL LOW (ref 79.5–101.0)
MONO%: 4.1 % (ref 0.0–14.0)
NEUT#: 8 10*3/uL — ABNORMAL HIGH (ref 1.5–6.5)
NEUT%: 75.4 % (ref 38.4–76.8)
Platelets: 261 10*3/uL (ref 145–400)
WBC: 10.6 10*3/uL — ABNORMAL HIGH (ref 3.9–10.3)
lymph#: 2.1 10*3/uL (ref 0.9–3.3)

## 2013-09-16 MED ORDER — PREDNISONE 20 MG PO TABS: 40.0000 mg | ORAL_TABLET | Freq: Every day | ORAL | Status: DC

## 2013-09-16 NOTE — Progress Notes (Signed)
[facility] OFFICE PROGRESS NOTE  Patient Care Team: No Pcp Per Patient as PCP - General (General Practice)  DIAGNOSIS: Anemia, and ITP  SUMMARY OF ONCOLOGIC HISTORY: #1 2012, she has thrombocytopenia that resolved spontaneously. The patient also recalled taking prednisone in the past for possible diagnosis of lupus #2 04/12/2013 she had mild microcytic anemia #3 07/09/2013 she has persistent mild microcytic anemia and thrombocytopenia with a platelet count of 51,000 #4 08/02/2013 I saw the patient and recommended a bone marrow aspirate and biopsy #5 08/06/2013 I perform bone marrow aspirate and biopsy. The patient received 1 unit of platelets for severe thrombocytopenia with a platelet count of 5000 #6 08/11/13: she is started on prednisone 80 mg daily #7 08/19/13: platelet count improves to 222. We appreciated steroid taper #8 09/02/2013 her platelet count dropped to 90,000. We increased prednisone back to 60 mg a day  INTERVAL HISTORY: [name] 72 y.o. female returns for further followup. She denies any recent fever, chills, night sweats or abnormal weight loss The patient denies any recent signs or symptoms of bleeding such as spontaneous epistaxis, hematuria or hematochezia. She continued to be compliant and taking oral iron supplements twice a day. She has seen a gastroenterologist with plan for endoscopy in the near future  I have reviewed the past medical history, past surgical history, social history and family history with the patient and they are unchanged from previous note.  ALLERGIES:  is allergic to codeine; oxycodone; penicillins; sulfonamide derivatives; tramadol hcl; and triamcinolone.  MEDICATIONS:  Current Outpatient Prescriptions  Medication Sig Dispense Refill  . acetaminophen (TYLENOL) 325 MG tablet Take 650 mg by mouth every 6 (six) hours as needed.        Marland Kitchen amLODipine (NORVASC) 5 MG tablet Take 5 mg by mouth daily.      Marland Kitchen aspirin 81 MG  tablet Take 81 mg by mouth daily.        . cholecalciferol (VITAMIN D) 400 UNITS TABS tablet Take 400 Units by mouth daily.      . citalopram (CELEXA) 20 MG tablet Take 20 mg by mouth daily.      . furosemide (LASIX) 20 MG tablet Take 20 mg by mouth daily.       Marland Kitchen levothyroxine (SYNTHROID, LEVOTHROID) 100 MCG tablet Take 100 mcg by mouth daily.        Marland Kitchen loratadine (CLARITIN) 10 MG tablet Take 10 mg by mouth daily.      Marland Kitchen losartan (COZAAR) 100 MG tablet Take 100 mg by mouth daily.      Marland Kitchen lovastatin (MEVACOR) 40 MG tablet Take 40 mg by mouth at bedtime.        . pantoprazole (PROTONIX) 40 MG tablet Take 1 tablet (40 mg total) by mouth daily.  90 tablet  3  . predniSONE (DELTASONE) 20 MG tablet Take 2 tablets (40 mg total) by mouth daily. Take 2 tablets daily with breakfast  100 tablet  1  . senna (SENOKOT) 8.6 MG tablet Take 1 tablet by mouth as needed.       . hyoscyamine (LEVSIN SL) 0.125 MG SL tablet Place 1 tablet (0.125 mg total) under the tongue every 4 (four) hours as needed for cramping.  30 tablet  2   No current facility-administered medications for this visit.    REVIEW OF SYSTEMS:   Constitutional: Denies fevers, chills or abnormal weight loss Behavioral/Psych: Mood is stable, no new changes  All other systems were reviewed with the patient and  are negative.  PHYSICAL EXAMINATION: ECOG PERFORMANCE STATUS: 0 - Asymptomatic  Filed Vitals:   09/16/13 1240  BP: 174/90  Pulse: 103  Temp: 96.6 F (35.9 C)  Resp: 19   Filed Weights   09/16/13 1240  Weight: 206 lb 11.2 oz (93.759 kg)    GENERAL:alert, no distress and comfortable. The patient looked mildly cushingoid SKIN: skin color, texture, turgor are normal, no rashes or significant lesions EYES: normal, Conjunctiva are pink and non-injected, sclera clear OROPHARYNX:no exudate, no erythema and lips, buccal mucosa, and tongue normal  NECK: supple, thyroid normal size, non-tender, without nodularity LYMPH:  no palpable  lymphadenopathy in the cervical, axillary or inguinal LUNGS: clear to auscultation and percussion with normal breathing effort HEART: regular rate & rhythm and no murmurs and no lower extremity edema ABDOMEN:abdomen soft, non-tender and normal bowel sounds Musculoskeletal:no cyanosis of digits and no clubbing  NEURO: alert & oriented x 3 with fluent speech, no focal motor/sensory deficits  LABORATORY DATA:  I have reviewed the data as listed    Component Value Date/Time   NA 140 09/09/2013 0856   NA 134* 08/02/2011 1534   K 3.3* 09/09/2013 0856   K 4.6 08/02/2011 1534   CL 98 08/02/2011 1534   CO2 26 09/09/2013 0856   CO2 28 08/02/2011 1534   GLUCOSE 93 09/09/2013 0856   GLUCOSE 93 08/02/2011 1534   BUN 24.3 09/09/2013 0856   BUN 18 08/02/2011 1534   CREATININE 1.3* 09/09/2013 0856   CREATININE 1.24* 08/02/2011 1534   CALCIUM 9.9 09/09/2013 0856   CALCIUM 11.3* 08/02/2011 1534   PROT 6.4 09/09/2013 0856   PROT 7.9 08/02/2011 1534   ALBUMIN 3.0* 09/09/2013 0856   ALBUMIN 3.8 08/02/2011 1534   AST 8 09/09/2013 0856   AST 14 08/02/2011 1534   ALT 11 09/09/2013 0856   ALT 10 08/02/2011 1534   ALKPHOS 53 09/09/2013 0856   ALKPHOS 110 08/02/2011 1534   BILITOT 0.53 09/09/2013 0856   BILITOT 0.3 08/02/2011 1534   GFRNONAA 53* 07/06/2011 0500   GFRAA >60 07/06/2011 0500    No results found for this basename: SPEP,  UPEP,   kappa and lambda light chains    Lab Results  Component Value Date   WBC 10.6* 09/16/2013   NEUTROABS 8.0* 09/16/2013   HGB 11.6 09/16/2013   HCT 35.0 09/16/2013   MCV 77.2* 09/16/2013   PLT 261 09/16/2013      Chemistry      Component Value Date/Time   NA 140 09/09/2013 0856   NA 134* 08/02/2011 1534   K 3.3* 09/09/2013 0856   K 4.6 08/02/2011 1534   CL 98 08/02/2011 1534   CO2 26 09/09/2013 0856   CO2 28 08/02/2011 1534   BUN 24.3 09/09/2013 0856   BUN 18 08/02/2011 1534   CREATININE 1.3* 09/09/2013 0856   CREATININE 1.24* 08/02/2011 1534      Component Value  Date/Time   CALCIUM 9.9 09/09/2013 0856   CALCIUM 11.3* 08/02/2011 1534   ALKPHOS 53 09/09/2013 0856   ALKPHOS 110 08/02/2011 1534   AST 8 09/09/2013 0856   AST 14 08/02/2011 1534   ALT 11 09/09/2013 0856   ALT 10 08/02/2011 1534   BILITOT 0.53 09/09/2013 0856   BILITOT 0.3 08/02/2011 1534    ASSESSMENT:  #1 iron deficiency anemia #2 ITP  PLAN:  #1 iron deficiency anemia She'll continue oral iron supplement. The MCV is improving. Her anemia is resolving. I recommend she continue with  the followup with the gastroenterologist and proceed with endoscopy #2 ITP With high dose of prednisone, her platelet count has normalized. I recommend we initiate steroid taper to 40 mg.tomorrow. Previously when we start tapering down, her platelet count starts to drop. 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 Hopefully with the steroid taper it would improve #4 hypertension Her blood pressure is high but the patient states that is because she's been running around. Her last 2 blood pressure measurements are high but that could be due to prednisone. At present I would not adjust any of her medication until she is off prednisone and if it remains very high will adjust her blood pressure medications. Orders Placed This Encounter  Procedures  . CBC with Differential    Standing Status: Standing     Number of Occurrences: 6     Standing Expiration Date: 09/16/2014   All questions were answered. The patient knows to call the clinic with any problems, questions or concerns. No barriers to learning was detected.    Syrah Daughtrey, MD 09/16/2013 1:34 PM

## 2013-09-16 NOTE — Telephone Encounter (Signed)
lab and ov for 11/20 per 11/6 POF Avs and cal given shh

## 2013-09-17 ENCOUNTER — Ambulatory Visit (AMBULATORY_SURGERY_CENTER): Payer: PRIVATE HEALTH INSURANCE | Admitting: Gastroenterology

## 2013-09-17 ENCOUNTER — Ambulatory Visit: Payer: Medicaid Other

## 2013-09-17 ENCOUNTER — Encounter: Payer: Self-pay | Admitting: Gastroenterology

## 2013-09-17 VITALS — BP 160/77 | HR 64 | Temp 98.6°F | Resp 33 | Ht 65.0 in | Wt 203.0 lb

## 2013-09-17 DIAGNOSIS — D649 Anemia, unspecified: Secondary | ICD-10-CM

## 2013-09-17 MED ORDER — SODIUM CHLORIDE 0.9 % IV SOLN: 500.0000 mL | INTRAVENOUS | Status: DC

## 2013-09-17 NOTE — Progress Notes (Signed)
Patient did not experience any of the following events: a burn prior to discharge; a fall within the facility; wrong site/side/patient/procedure/implant event; or a hospital transfer or hospital admission upon discharge from the facility. (G8907) Patient did not have preoperative order for IV antibiotic SSI prophylaxis. (G8918)  

## 2013-09-17 NOTE — Patient Instructions (Signed)
YOU HAD AN ENDOSCOPIC PROCEDURE TODAY AT THE [facility]: Refer to the procedure report that was given to you for any specific questions about what was found during the examination.  If the procedure report does not answer your questions, please call your gastroenterologist to clarify.  If you requested that your care partner not be given the details of your procedure findings, then the procedure report has been included in a sealed envelope for you to review at your convenience later.  YOU SHOULD EXPECT: Some feelings of bloating in the abdomen. Passage of more gas than usual.  Walking can help get rid of the air that was put into your GI tract during the procedure and reduce the bloating. If you had a lower endoscopy (such as a colonoscopy or flexible sigmoidoscopy) you may notice spotting of blood in your stool or on the toilet paper. If you underwent a bowel prep for your procedure, then you may not have a normal bowel movement for a few days.  DIET: Your first meal following the procedure should be a light meal and then it is ok to progress to your normal diet.  A half-sandwich or bowl of soup is an example of a good first meal.  Heavy or fried foods are harder to digest and may make you feel nauseous or bloated.  Likewise meals heavy in dairy and vegetables can cause extra gas to form and this can also increase the bloating.  Drink plenty of fluids but you should avoid alcoholic beverages for 24 hours.  ACTIVITY: Your care partner should take you home directly after the procedure.  You should plan to take it easy, moving slowly for the rest of the day.  You can resume normal activity the day after the procedure however you should NOT DRIVE or use heavy machinery for 24 hours (because of the sedation medicines used during the test).    SYMPTOMS TO REPORT IMMEDIATELY: A gastroenterologist can be reached at any hour.  During normal business hours, 8:30 AM to 5:00 PM Monday through Friday,  call [phone].  After hours and on weekends, please call the GI answering service at [phone] who will take a message and have the physician on call contact you.  Following upper endoscopy (EGD)  Vomiting of blood or coffee ground material  New chest pain or pain under the shoulder blades  Painful or persistently difficult swallowing  New shortness of breath  Fever of 100F or higher  Black, tarry-looking stools  FOLLOW UP: If any biopsies were taken you will be contacted by phone or by letter within the next 1-3 weeks.  Call your gastroenterologist if you have not heard about the biopsies in 3 weeks.  Our staff will call the home number listed on your records the next business day following your procedure to check on you and address any questions or concerns that you may have at that time regarding the information given to you following your procedure. This is a courtesy call and so if there is no answer at the home number and we have not heard from you through the emergency physician on call, we will assume that you have returned to your regular daily activities without incident.  SIGNATURES/CONFIDENTIALITY: You and/or your care partner have signed paperwork which will be entered into your electronic medical record.  These signatures attest to the fact that that the information above on your After Visit Summary has been reviewed and is understood.  Full responsibility of   the confidentiality of this discharge information lies with you and/or your care-partner. 

## 2013-09-17 NOTE — Progress Notes (Signed)
Procedure ends, to recovery, report given and VSS. 

## 2013-09-17 NOTE — Op Note (Signed)
[facility] [address] [place], [address]   ENDOSCOPY PROCEDURE REPORT  PATIENT: [name], [name]  MR#: [id number] BIRTHDATE: Mar 30, 1941 , 72  yrs. old GENDER: Female ENDOSCOPIST: Louis Meckel, MD REFERRED BY:  Ruthann Cancer, M.D. PROCEDURE DATE:  09/17/2013 PROCEDURE:  EGD, diagnostic ASA CLASS:     Class II INDICATIONS:  Iron deficiency anemia. MEDICATIONS: MAC sedation, administered by CRNA, Propofol (Diprivan) 140 mg IV, and Simethicone 0.6cc PO TOPICAL ANESTHETIC: Cetacaine Spray  DESCRIPTION OF PROCEDURE: After the risks benefits and alternatives of the procedure were thoroughly explained, informed consent was obtained.  The LB [id number] endoscope was introduced through the mouth and advanced to the third portion of the duodenum. Without limitations.  The instrument was slowly withdrawn as the mucosa was fully examined.      The upper, middle and distal third of the esophagus were carefully inspected and no abnormalities were noted.  The z-line was well seen at the GEJ.  The endoscope was pushed into the fundus which was normal including a retroflexed view.  The antrum, gastric body, first and second part of the duodenum were unremarkable. Retroflexed views revealed no abnormalities.     The scope was then withdrawn from the patient and the procedure completed.  COMPLICATIONS: There were no complications. ENDOSCOPIC IMPRESSION: Normal EGD  RECOMMENDATIONS: await Hemoccult results; if positive, proceed with colonoscopy  REPEAT EXAM:  eSigned:  Louis Meckel, MD 09/17/2013 11:09 AM   CC:

## 2013-09-20 ENCOUNTER — Telehealth: Payer: Self-pay | Admitting: *Deleted

## 2013-09-20 NOTE — Telephone Encounter (Signed)
  Follow up Call-  Call back number 09/17/2013  Post procedure Call Back phone  # [phone] or [phone]  Permission to leave phone message Yes     Patient questions:  Do you have a fever, pain , or abdominal swelling? no Pain Score  0 *  Have you tolerated food without any problems? yes  Have you been able to return to your normal activities? yes  Do you have any questions about your discharge instructions: Diet   no Medications  no Follow up visit  no  Do you have questions or concerns about your Care? no  Actions: * If pain score is 4 or above: No action needed, pain <4.

## 2013-09-23 ENCOUNTER — Other Ambulatory Visit: Payer: PRIVATE HEALTH INSURANCE

## 2013-09-30 ENCOUNTER — Ambulatory Visit (HOSPITAL_BASED_OUTPATIENT_CLINIC_OR_DEPARTMENT_OTHER): Payer: PRIVATE HEALTH INSURANCE | Admitting: Hematology and Oncology

## 2013-09-30 ENCOUNTER — Telehealth: Payer: Self-pay | Admitting: Hematology and Oncology

## 2013-09-30 ENCOUNTER — Other Ambulatory Visit (HOSPITAL_BASED_OUTPATIENT_CLINIC_OR_DEPARTMENT_OTHER): Payer: PRIVATE HEALTH INSURANCE | Admitting: Lab

## 2013-09-30 VITALS — BP 179/85 | HR 80 | Temp 98.5°F | Resp 20 | Ht 65.0 in | Wt 208.5 lb

## 2013-09-30 DIAGNOSIS — D649 Anemia, unspecified: Secondary | ICD-10-CM

## 2013-09-30 DIAGNOSIS — D693 Immune thrombocytopenic purpura: Secondary | ICD-10-CM

## 2013-09-30 DIAGNOSIS — I10 Essential (primary) hypertension: Secondary | ICD-10-CM

## 2013-09-30 DIAGNOSIS — E249 Cushing's syndrome, unspecified: Secondary | ICD-10-CM

## 2013-09-30 DIAGNOSIS — T380X5A Adverse effect of glucocorticoids and synthetic analogues, initial encounter: Secondary | ICD-10-CM

## 2013-09-30 LAB — CBC WITH DIFFERENTIAL/PLATELET
BASO%: 0.7 % (ref 0.0–2.0)
Basophils Absolute: 0.1 10*3/uL (ref 0.0–0.1)
EOS%: 0.1 % (ref 0.0–7.0)
Eosinophils Absolute: 0 10*3/uL (ref 0.0–0.5)
HGB: 11.1 g/dL — ABNORMAL LOW (ref 11.6–15.9)
LYMPH%: 11.8 % — ABNORMAL LOW (ref 14.0–49.7)
MCH: 24.5 pg — ABNORMAL LOW (ref 25.1–34.0)
MCV: 77.8 fL — ABNORMAL LOW (ref 79.5–101.0)
MONO%: 3.2 % (ref 0.0–14.0)
NEUT#: 9.3 10*3/uL — ABNORMAL HIGH (ref 1.5–6.5)
Platelets: 234 10*3/uL (ref 145–400)
RBC: 4.52 10*6/uL (ref 3.70–5.45)
RDW: 19.1 % — ABNORMAL HIGH (ref 11.2–14.5)

## 2013-09-30 NOTE — Telephone Encounter (Signed)
Gave pt appt for lab and MD on december 2014 °

## 2013-09-30 NOTE — Progress Notes (Signed)
[facility] OFFICE PROGRESS NOTE  No primary provider on file. DIAGNOSIS:  ITP and anemia  SUMMARY OF HEMATOLOGIC HISTORY: #1 2012, she has thrombocytopenia that resolved spontaneously. The patient also recalled taking prednisone in the past for possible diagnosis of lupus #2 04/12/2013 she had mild microcytic anemia #3 07/09/2013 she has persistent mild microcytic anemia and thrombocytopenia with a platelet count of 51,000 #4 08/02/2013 I saw the patient and recommended a bone marrow aspirate and biopsy #5 08/06/2013 I perform bone marrow aspirate and biopsy. The patient received 1 unit of platelets for severe thrombocytopenia with a platelet count of 5000 #6 08/11/13: she is started on prednisone 80 mg daily #7 08/19/13: platelet count improves to 222. We appreciated steroid taper #8 09/02/2013 her platelet count dropped to 90,000. We increased prednisone back to 60 mg a day #9 09/16/13: dose of prednisone was reduced back to 40 mg INTERVAL HISTORY: [name] 72 y.o. female returns for further followup. She complained of diffuse swelling and weight gain. She had easy bruising but no spontaneous bleeding. She underwent EGD and colonoscopy, results pending. She denies any recent fever, chills, night sweats or abnormal weight loss  I have reviewed the past medical history, past surgical history, social history and family history with the patient and they are unchanged from previous note.  ALLERGIES:  is allergic to codeine; oxycodone; penicillins; sulfonamide derivatives; tramadol hcl; and triamcinolone.  MEDICATIONS:  Current Outpatient Prescriptions  Medication Sig Dispense Refill  . acetaminophen (TYLENOL) 325 MG tablet Take 650 mg by mouth every 6 (six) hours as needed.        Marland Kitchen amLODipine (NORVASC) 5 MG tablet Take 5 mg by mouth daily.      Marland Kitchen aspirin 81 MG tablet Take 81 mg by mouth daily.        . cholecalciferol (VITAMIN D) 400 UNITS TABS tablet Take 400 Units  by mouth daily.      . citalopram (CELEXA) 20 MG tablet Take 20 mg by mouth daily.      . furosemide (LASIX) 20 MG tablet Take 20 mg by mouth daily.       . hyoscyamine (LEVSIN SL) 0.125 MG SL tablet Place 1 tablet (0.125 mg total) under the tongue every 4 (four) hours as needed for cramping.  30 tablet  2  . levothyroxine (SYNTHROID, LEVOTHROID) 100 MCG tablet Take 100 mcg by mouth daily.        Marland Kitchen loratadine (CLARITIN) 10 MG tablet Take 10 mg by mouth daily.      Marland Kitchen losartan (COZAAR) 100 MG tablet Take 100 mg by mouth daily.      Marland Kitchen lovastatin (MEVACOR) 40 MG tablet Take 40 mg by mouth at bedtime.        . pantoprazole (PROTONIX) 40 MG tablet Take 1 tablet (40 mg total) by mouth daily.  90 tablet  3  . predniSONE (DELTASONE) 20 MG tablet Take 2 tablets (40 mg total) by mouth daily. Take 2 tablets daily with breakfast  100 tablet  1  . senna (SENOKOT) 8.6 MG tablet Take 1 tablet by mouth as needed.        No current facility-administered medications for this visit.     REVIEW OF SYSTEMS:   Constitutional: Denies fevers, chills or night sweats All other systems were reviewed with the patient and are negative.  PHYSICAL EXAMINATION: ECOG PERFORMANCE STATUS: 0 - Asymptomatic  Filed Vitals:   09/30/13 1239  BP: 179/85  Pulse: 80  Temp:  98.5 F (36.9 C)  Resp: 20   Filed Weights   09/30/13 1239  Weight: 208 lb 8 oz (94.575 kg)    GENERAL:alert, no distress and comfortable. She appear cushingoid OROPHARYNX:no exudate, no erythema and lips, buccal mucosa, and tongue normal . No evidence of oral thrush LABORATORY DATA:  I have reviewed the data as listed Results for orders placed in visit on 09/30/13 (from the past 48 hour(s))  CBC WITH DIFFERENTIAL     Status: Abnormal   Collection Time    09/30/13 12:20 PM      Result Value Range   WBC 11.0 (*) 3.9 - 10.3 10e3/uL   NEUT# 9.3 (*) 1.5 - 6.5 10e3/uL   HGB 11.1 (*) 11.6 - 15.9 g/dL   HCT 16.1  09.6 - 04.5 %   Platelets 234  145 -  400 10e3/uL   MCV 77.8 (*) 79.5 - 101.0 fL   MCH 24.5 (*) 25.1 - 34.0 pg   MCHC 31.5  31.5 - 36.0 g/dL   RBC 4.09  8.11 - 9.14 10e6/uL   RDW 19.1 (*) 11.2 - 14.5 %   lymph# 1.3  0.9 - 3.3 10e3/uL   MONO# 0.4  0.1 - 0.9 10e3/uL   Eosinophils Absolute 0.0  0.0 - 0.5 10e3/uL   Basophils Absolute 0.1  0.0 - 0.1 10e3/uL   NEUT% 84.2 (*) 38.4 - 76.8 %   LYMPH% 11.8 (*) 14.0 - 49.7 %   MONO% 3.2  0.0 - 14.0 %   EOS% 0.1  0.0 - 7.0 %   BASO% 0.7  0.0 - 2.0 %    Lab Results  Component Value Date   WBC 11.0* 09/30/2013   HGB 11.1* 09/30/2013   HCT 35.2 09/30/2013   MCV 77.8* 09/30/2013   PLT 234 09/30/2013   ASSESSMENT & PLAN:  #1 iron deficiency anemia She'll continue oral iron supplement. The MCV is improving. Her anemia is resolving. Results of the endoscopy is still pending  #2 ITP With high dose of prednisone, her platelet count has normalized. I recommend we continue on steroid taper to 20 mg stopped tomorrow. 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 Hopefully with the steroid taper it would improve #4 hypertension Her blood pressure is high but this could be related to effects of prednisone. Hopefully will continue to improve. All questions were answered. The patient knows to call the clinic with any problems, questions or concerns. No barriers to learning was detected.  I spent 15 minutes counseling the patient face to face. The total time spent in the appointment was 20 minutes and more than 50% was on counseling.     Talayeh Bruinsma, MD 09/30/2013 12:51 PM

## 2013-10-14 ENCOUNTER — Encounter: Payer: Self-pay | Admitting: Hematology and Oncology

## 2013-10-14 ENCOUNTER — Other Ambulatory Visit (HOSPITAL_BASED_OUTPATIENT_CLINIC_OR_DEPARTMENT_OTHER): Payer: PRIVATE HEALTH INSURANCE | Admitting: Lab

## 2013-10-14 ENCOUNTER — Ambulatory Visit (HOSPITAL_BASED_OUTPATIENT_CLINIC_OR_DEPARTMENT_OTHER): Payer: PRIVATE HEALTH INSURANCE | Admitting: Hematology and Oncology

## 2013-10-14 ENCOUNTER — Other Ambulatory Visit: Payer: Self-pay | Admitting: Hematology and Oncology

## 2013-10-14 ENCOUNTER — Ambulatory Visit (HOSPITAL_BASED_OUTPATIENT_CLINIC_OR_DEPARTMENT_OTHER): Payer: PRIVATE HEALTH INSURANCE | Admitting: Lab

## 2013-10-14 ENCOUNTER — Telehealth: Payer: Self-pay | Admitting: Hematology and Oncology

## 2013-10-14 VITALS — BP 151/63 | HR 84 | Temp 97.7°F | Resp 18 | Ht 65.0 in | Wt 202.6 lb

## 2013-10-14 DIAGNOSIS — R300 Dysuria: Secondary | ICD-10-CM

## 2013-10-14 DIAGNOSIS — D693 Immune thrombocytopenic purpura: Secondary | ICD-10-CM

## 2013-10-14 DIAGNOSIS — B370 Candidal stomatitis: Secondary | ICD-10-CM

## 2013-10-14 DIAGNOSIS — I10 Essential (primary) hypertension: Secondary | ICD-10-CM

## 2013-10-14 DIAGNOSIS — D509 Iron deficiency anemia, unspecified: Secondary | ICD-10-CM

## 2013-10-14 DIAGNOSIS — N390 Urinary tract infection, site not specified: Secondary | ICD-10-CM

## 2013-10-14 HISTORY — DX: Candidal stomatitis: B37.0

## 2013-10-14 HISTORY — DX: Urinary tract infection, site not specified: N39.0

## 2013-10-14 HISTORY — DX: Dysuria: R30.0

## 2013-10-14 LAB — URINALYSIS, MICROSCOPIC - CHCC
Bilirubin (Urine): NEGATIVE
Glucose: NEGATIVE mg/dL
Ketones: NEGATIVE mg/dL
Nitrite: NEGATIVE
Specific Gravity, Urine: 1.015 (ref 1.003–1.035)
pH: 6 (ref 4.6–8.0)

## 2013-10-14 LAB — CBC WITH DIFFERENTIAL/PLATELET
BASO%: 0.5 % (ref 0.0–2.0)
Basophils Absolute: 0.1 10*3/uL (ref 0.0–0.1)
EOS%: 0.4 % (ref 0.0–7.0)
Eosinophils Absolute: 0 10*3/uL (ref 0.0–0.5)
HGB: 11.5 g/dL — ABNORMAL LOW (ref 11.6–15.9)
LYMPH%: 31.6 % (ref 14.0–49.7)
MCHC: 32 g/dL (ref 31.5–36.0)
MCV: 78.7 fL — ABNORMAL LOW (ref 79.5–101.0)
MONO#: 0.5 10*3/uL (ref 0.1–0.9)
MONO%: 4.7 % (ref 0.0–14.0)
NEUT#: 6.2 10*3/uL (ref 1.5–6.5)
Platelets: 255 10*3/uL (ref 145–400)
RBC: 4.56 10*6/uL (ref 3.70–5.45)
RDW: 18.6 % — ABNORMAL HIGH (ref 11.2–14.5)

## 2013-10-14 MED ORDER — CEFDINIR 300 MG PO CAPS: 300.0000 mg | ORAL_CAPSULE | Freq: Two times a day (BID) | ORAL | Status: DC

## 2013-10-14 MED ORDER — FLUCONAZOLE 100 MG PO TABS: 100.0000 mg | ORAL_TABLET | Freq: Every day | ORAL | Status: DC

## 2013-10-14 NOTE — Telephone Encounter (Signed)
appts made per 12/4 POF AVS and CAL given shh °

## 2013-10-14 NOTE — Progress Notes (Signed)
[facility] OFFICE PROGRESS NOTE  [name] Buddy, MD DIAGNOSIS:  ITP and anemia  SUMMARY OF HEMATOLOGIC HISTORY: #1 2012, she has thrombocytopenia that resolved spontaneously. The patient also recalled taking prednisone in the past for possible diagnosis of lupus #2 04/12/2013 she had mild microcytic anemia #3 07/09/2013 she has persistent mild microcytic anemia and thrombocytopenia with a platelet count of 51,000 #4 08/02/2013 I saw the patient and recommended a bone marrow aspirate and biopsy #5 08/06/2013 I perform bone marrow aspirate and biopsy. The patient received 1 unit of platelets for severe thrombocytopenia with a platelet count of 5000 #6 08/11/13: she is started on prednisone 80 mg daily #7 08/19/13: platelet count improves to 222. We appreciated steroid taper #8 09/02/2013 her platelet count dropped to 90,000. We increased prednisone back to 60 mg a day #9 09/16/13: dose of prednisone was reduced back to 40 mg #10 09/30/13: prednisone dose was reduced to 20 mg #11 10/14/13: prednisone dose was reduced to 10 mg INTERVAL HISTORY: [name] 72 y.o. female returns for further follow-up She complained of significant yeast infection in her mouth and bladder She denies any recent fever, chills, night sweats or abnormal weight loss The patient denies any recent signs or symptoms of bleeding such as spontaneous epistaxis, hematuria or hematochezia.  I have reviewed the past medical history, past surgical history, social history and family history with the patient and they are unchanged from previous note.  ALLERGIES:  is allergic to codeine; oxycodone; penicillins; sulfonamide derivatives; tramadol hcl; and triamcinolone.  MEDICATIONS:  Current Outpatient Prescriptions  Medication Sig Dispense Refill  . acetaminophen (TYLENOL) 325 MG tablet Take 650 mg by mouth every 6 (six) hours as needed.        Marland Kitchen amLODipine (NORVASC) 5 MG tablet Take 5 mg by mouth daily.      Marland Kitchen  aspirin 81 MG tablet Take 81 mg by mouth daily.        . cholecalciferol (VITAMIN D) 400 UNITS TABS tablet Take 400 Units by mouth daily.      . citalopram (CELEXA) 20 MG tablet Take 20 mg by mouth daily.      . furosemide (LASIX) 20 MG tablet Take 20 mg by mouth daily.       . hyoscyamine (LEVSIN SL) 0.125 MG SL tablet Place 1 tablet (0.125 mg total) under the tongue every 4 (four) hours as needed for cramping.  30 tablet  2  . levothyroxine (SYNTHROID, LEVOTHROID) 100 MCG tablet Take 100 mcg by mouth daily.        Marland Kitchen loratadine (CLARITIN) 10 MG tablet Take 10 mg by mouth daily.      Marland Kitchen losartan (COZAAR) 100 MG tablet Take 100 mg by mouth daily.      Marland Kitchen lovastatin (MEVACOR) 40 MG tablet Take 40 mg by mouth at bedtime.        . pantoprazole (PROTONIX) 40 MG tablet Take 1 tablet (40 mg total) by mouth daily.  90 tablet  3  . predniSONE (DELTASONE) 20 MG tablet Take 2 tablets (40 mg total) by mouth daily. Take 2 tablets daily with breakfast  100 tablet  1  . senna (SENOKOT) 8.6 MG tablet Take 1 tablet by mouth as needed.       . fluconazole (DIFLUCAN) 100 MG tablet Take 1 tablet (100 mg total) by mouth daily.  7 tablet  1   No current facility-administered medications for this visit.     REVIEW OF SYSTEMS:  Constitutional: Denies fevers, chills or night sweats Eyes: Denies blurriness of vision Respiratory: Denies cough, dyspnea or wheezes Cardiovascular: Denies palpitation, chest discomfort or lower extremity swelling Gastrointestinal:  Denies nausea, heartburn or change in bowel habits Skin: Denies abnormal skin rashes Lymphatics: Denies new lymphadenopathy or easy bruising Neurological:Denies numbness, tingling or new weaknesses Behavioral/Psych: Mood is stable, no new changes  All other systems were reviewed with the patient and are negative.  PHYSICAL EXAMINATION: ECOG PERFORMANCE STATUS: 1 - Symptomatic but completely ambulatory  Filed Vitals:   10/14/13 1005  BP: 151/63  Pulse:  84  Temp: 97.7 F (36.5 C)  Resp: 18   Filed Weights   10/14/13 1005  Weight: 202 lb 9.6 oz (91.899 kg)    GENERAL:alert, no distress and comfortable. She looked Cushingoid SKIN: skin color, texture, turgor are normal, no rashes or significant lesions EYES: normal, Conjunctiva are pink and non-injected, sclera clear OROPHARYNX:no exudate, no erythema and lips, buccal mucosa, and tongue normal . No evidence of yeast infection NECK: supple, thyroid normal size, non-tender, without nodularity LYMPH:  no palpable lymphadenopathy in the cervical, axillary or inguinal LUNGS: clear to auscultation and percussion with normal breathing effort HEART: regular rate & rhythm and no murmurs and no lower extremity edema ABDOMEN:abdomen soft, non-tender and normal bowel sounds Musculoskeletal:no cyanosis of digits and no clubbing  NEURO: alert & oriented x 3 with fluent speech, no focal motor/sensory deficits  LABORATORY DATA:  I have reviewed the data as listed Results for orders placed in visit on 10/14/13 (from the past 48 hour(s))  CBC WITH DIFFERENTIAL     Status: Abnormal   Collection Time    10/14/13  9:54 AM      Result Value Range   WBC 9.9  3.9 - 10.3 10e3/uL   NEUT# 6.2  1.5 - 6.5 10e3/uL   HGB 11.5 (*) 11.6 - 15.9 g/dL   HCT 96.0  45.4 - 09.8 %   Platelets 255  145 - 400 10e3/uL   MCV 78.7 (*) 79.5 - 101.0 fL   MCH 25.2  25.1 - 34.0 pg   MCHC 32.0  31.5 - 36.0 g/dL   RBC 1.19  1.47 - 8.29 10e6/uL   RDW 18.6 (*) 11.2 - 14.5 %   lymph# 3.1  0.9 - 3.3 10e3/uL   MONO# 0.5  0.1 - 0.9 10e3/uL   Eosinophils Absolute 0.0  0.0 - 0.5 10e3/uL   Basophils Absolute 0.1  0.0 - 0.1 10e3/uL   NEUT% 62.8  38.4 - 76.8 %   LYMPH% 31.6  14.0 - 49.7 %   MONO% 4.7  0.0 - 14.0 %   EOS% 0.4  0.0 - 7.0 %   BASO% 0.5  0.0 - 2.0 %    Lab Results  Component Value Date   WBC 9.9 10/14/2013   HGB 11.5* 10/14/2013   HCT 35.9 10/14/2013   MCV 78.7* 10/14/2013   PLT 255 10/14/2013    ASSESSMENT &  PLAN:  #1 iron deficiency anemia She'll continue oral iron supplement. The MCV is improving. Her anemia is resolving. Results of the endoscopy is still pending but so far all tests are negative #2 ITP With high dose of prednisone, her platelet count has normalized. I recommend we continue on steroid taper to 10 mg start tomorrow. 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 Hopefully with the steroid taper it would improve #4 hypertension Her blood pressure is high but this could be related to effects of prednisone. Hopefully will  continue to improve. #5 Yeast infection I prescribed Diflucan. I told her to hold Lovastatin #6 Dysuria Will check UA and urine cultures  Due to multiple new complications, I will see her next week for further assessment All questions were answered. The patient knows to call the clinic with any problems, questions or concerns. No barriers to learning was detected.  All questions were answered. The patient knows to call the clinic with any problems, questions or concerns. No barriers to learning was detected.  Drewey Begue, MD 10/14/2013 10:48 AM

## 2013-10-21 ENCOUNTER — Other Ambulatory Visit (HOSPITAL_BASED_OUTPATIENT_CLINIC_OR_DEPARTMENT_OTHER): Payer: PRIVATE HEALTH INSURANCE

## 2013-10-21 ENCOUNTER — Telehealth: Payer: Self-pay | Admitting: Hematology and Oncology

## 2013-10-21 ENCOUNTER — Ambulatory Visit (HOSPITAL_BASED_OUTPATIENT_CLINIC_OR_DEPARTMENT_OTHER): Payer: PRIVATE HEALTH INSURANCE | Admitting: Hematology and Oncology

## 2013-10-21 VITALS — BP 155/83 | HR 83 | Temp 98.1°F | Resp 18 | Ht 65.0 in | Wt 205.5 lb

## 2013-10-21 DIAGNOSIS — D693 Immune thrombocytopenic purpura: Secondary | ICD-10-CM

## 2013-10-21 DIAGNOSIS — D649 Anemia, unspecified: Secondary | ICD-10-CM

## 2013-10-21 DIAGNOSIS — N390 Urinary tract infection, site not specified: Secondary | ICD-10-CM

## 2013-10-21 DIAGNOSIS — I10 Essential (primary) hypertension: Secondary | ICD-10-CM

## 2013-10-21 DIAGNOSIS — E249 Cushing's syndrome, unspecified: Secondary | ICD-10-CM

## 2013-10-21 DIAGNOSIS — T380X5A Adverse effect of glucocorticoids and synthetic analogues, initial encounter: Secondary | ICD-10-CM

## 2013-10-21 LAB — CBC WITH DIFFERENTIAL/PLATELET
Basophils Absolute: 0.1 10*3/uL (ref 0.0–0.1)
EOS%: 0.3 % (ref 0.0–7.0)
Eosinophils Absolute: 0 10*3/uL (ref 0.0–0.5)
HGB: 11.2 g/dL — ABNORMAL LOW (ref 11.6–15.9)
LYMPH%: 26.9 % (ref 14.0–49.7)
MCH: 25.4 pg (ref 25.1–34.0)
MCV: 77 fL — ABNORMAL LOW (ref 79.5–101.0)
MONO#: 0.5 10*3/uL (ref 0.1–0.9)
MONO%: 5.4 % (ref 0.0–14.0)
NEUT#: 6.3 10*3/uL (ref 1.5–6.5)
NEUT%: 66.6 % (ref 38.4–76.8)
Platelets: 302 10*3/uL (ref 145–400)
RBC: 4.39 10*6/uL (ref 3.70–5.45)
WBC: 9.5 10*3/uL (ref 3.9–10.3)

## 2013-10-21 LAB — IRON AND TIBC CHCC: Iron: 38 ug/dL — ABNORMAL LOW (ref 41–142)

## 2013-10-21 LAB — FERRITIN CHCC: Ferritin: 81 ng/ml (ref 9–269)

## 2013-10-21 NOTE — Progress Notes (Signed)
[facility] OFFICE PROGRESS NOTE  [name] Buddy, MD DIAGNOSIS:  ITP and anemia  SUMMARY OF HEMATOLOGIC HISTORY: #1 2012, she has thrombocytopenia that resolved spontaneously. The patient also recalled taking prednisone in the past for possible diagnosis of lupus #2 04/12/2013 she had mild microcytic anemia #3 07/09/2013 she has persistent mild microcytic anemia and thrombocytopenia with a platelet count of 51,000 #4 08/02/2013 I saw the patient and recommended a bone marrow aspirate and biopsy #5 08/06/2013 I perform bone marrow aspirate and biopsy. The patient received 1 unit of platelets for severe thrombocytopenia with a platelet count of 5000 #6 08/11/13: she is started on prednisone 80 mg daily #7 08/19/13: platelet count improves to 222. We appreciated steroid taper #8 09/02/2013 her platelet count dropped to 90,000. We increased prednisone back to 60 mg a day #9 09/16/13: dose of prednisone was reduced back to 40 mg #10 09/30/13: prednisone dose was reduced to 20 mg #11 10/14/13: prednisone dose was reduced to 10 mg #12 10/21/13: Prednisone dose was reduced to 5 mg INTERVAL HISTORY: [name] 72 y.o. female returns for further followup. A yeast infection in the mouth and bladder has resolved. A urinary tract infection is improving. The patient denies any recent signs or symptoms of bleeding such as spontaneous epistaxis, hematuria or hematochezia. She denies any recent fever, chills, night sweats or abnormal weight loss  I have reviewed the past medical history, past surgical history, social history and family history with the patient and they are unchanged from previous note.  ALLERGIES:  is allergic to codeine; oxycodone; penicillins; sulfonamide derivatives; tramadol hcl; and triamcinolone.  MEDICATIONS:  Current Outpatient Prescriptions  Medication Sig Dispense Refill  . acetaminophen (TYLENOL) 325 MG tablet Take 650 mg by mouth every 6 (six) hours as needed.         Marland Kitchen amLODipine (NORVASC) 5 MG tablet Take 5 mg by mouth daily.      Marland Kitchen aspirin 81 MG tablet Take 81 mg by mouth daily.        . cefdinir (OMNICEF) 300 MG capsule Take 1 capsule (300 mg total) by mouth 2 (two) times daily.  14 capsule  0  . cholecalciferol (VITAMIN D) 400 UNITS TABS tablet Take 400 Units by mouth daily.      . citalopram (CELEXA) 20 MG tablet Take 20 mg by mouth daily.      . fluconazole (DIFLUCAN) 100 MG tablet Take 1 tablet (100 mg total) by mouth daily.  7 tablet  1  . furosemide (LASIX) 20 MG tablet Take 20 mg by mouth daily.       . hyoscyamine (LEVSIN SL) 0.125 MG SL tablet Place 1 tablet (0.125 mg total) under the tongue every 4 (four) hours as needed for cramping.  30 tablet  2  . levothyroxine (SYNTHROID, LEVOTHROID) 100 MCG tablet Take 100 mcg by mouth daily.        Marland Kitchen loratadine (CLARITIN) 10 MG tablet Take 10 mg by mouth daily.      Marland Kitchen losartan (COZAAR) 100 MG tablet Take 100 mg by mouth daily.      Marland Kitchen lovastatin (MEVACOR) 40 MG tablet Take 40 mg by mouth at bedtime.        . pantoprazole (PROTONIX) 40 MG tablet Take 1 tablet (40 mg total) by mouth daily.  90 tablet  3  . predniSONE (DELTASONE) 20 MG tablet Take 10 mg by mouth daily. Take 2 tablets daily with breakfast      . senna (  SENOKOT) 8.6 MG tablet Take 1 tablet by mouth as needed.        No current facility-administered medications for this visit.     REVIEW OF SYSTEMS:   Constitutional: Denies fevers, chills or night sweats Eyes: Denies blurriness of vision Ears, nose, mouth, throat, and face: Denies mucositis or sore throat Respiratory: Denies cough, dyspnea or wheezes Cardiovascular: Denies palpitation, chest discomfort or lower extremity swelling Gastrointestinal:  Denies nausea, heartburn or change in bowel habits Skin: Denies abnormal skin rashes Lymphatics: Denies new lymphadenopathy or easy bruising Neurological:Denies numbness, tingling or new weaknesses Behavioral/Psych: Mood is stable, no  new changes  All other systems were reviewed with the patient and are negative.  PHYSICAL EXAMINATION: ECOG PERFORMANCE STATUS: 1 - Symptomatic but completely ambulatory  Filed Vitals:   10/21/13 1211  BP: 155/83  Pulse: 83  Temp: 98.1 F (36.7 C)  Resp: 18   Filed Weights   10/21/13 1211  Weight: 205 lb 8 oz (93.214 kg)    GENERAL:alert, no distress and comfortable. She looked cushingoid SKIN: skin color, texture, turgor are normal, no rashes or significant lesions Musculoskeletal:no cyanosis of digits and no clubbing  NEURO: alert & oriented x 3 with fluent speech, no focal motor/sensory deficits  LABORATORY DATA:  I have reviewed the data as listed Results for orders placed in visit on 10/21/13 (from the past 48 hour(s))  CBC WITH DIFFERENTIAL     Status: Abnormal   Collection Time    10/21/13 11:52 AM      Result Value Range   WBC 9.5  3.9 - 10.3 10e3/uL   NEUT# 6.3  1.5 - 6.5 10e3/uL   HGB 11.2 (*) 11.6 - 15.9 g/dL   HCT 45.4 (*) 09.8 - 11.9 %   Platelets 302  145 - 400 10e3/uL   MCV 77.0 (*) 79.5 - 101.0 fL   MCH 25.4  25.1 - 34.0 pg   MCHC 33.1  31.5 - 36.0 g/dL   RBC 1.47  8.29 - 5.62 10e6/uL   RDW 18.0 (*) 11.2 - 14.5 %   lymph# 2.5  0.9 - 3.3 10e3/uL   MONO# 0.5  0.1 - 0.9 10e3/uL   Eosinophils Absolute 0.0  0.0 - 0.5 10e3/uL   Basophils Absolute 0.1  0.0 - 0.1 10e3/uL   NEUT% 66.6  38.4 - 76.8 %   LYMPH% 26.9  14.0 - 49.7 %   MONO% 5.4  0.0 - 14.0 %   EOS% 0.3  0.0 - 7.0 %   BASO% 0.8  0.0 - 2.0 %    Lab Results  Component Value Date   WBC 9.5 10/21/2013   HGB 11.2* 10/21/2013   HCT 33.8* 10/21/2013   MCV 77.0* 10/21/2013   PLT 302 10/21/2013  ASSESSMENT & PLAN:  #1 iron deficiency anemia She'll continue oral iron supplement. The MCV is improving. Recheck iron study next visit Her anemia is resolving. Results of the endoscopy is still pending but so far all tests are negative #2 ITP With high dose of prednisone, her platelet count has  normalized. I recommend we continue on steroid taper to 5 g start tomorrow. 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 Hopefully with the steroid taper it would improve #4 hypertension Her blood pressure is high but this could be related to effects of prednisone. Hopefully will continue to improve.  I spent 15 minutes counseling the patient face to face. The total time spent in the appointment was 20 minutes and more than 50%  was on counseling.     [facility], Tahni Porchia, MD 10/21/2013 12:49 PM

## 2013-10-21 NOTE — Telephone Encounter (Signed)
gv adn printed appts ched and avs for pt for DEC °

## 2013-11-01 ENCOUNTER — Ambulatory Visit (HOSPITAL_BASED_OUTPATIENT_CLINIC_OR_DEPARTMENT_OTHER): Payer: PRIVATE HEALTH INSURANCE | Admitting: Hematology and Oncology

## 2013-11-01 ENCOUNTER — Telehealth: Payer: Self-pay | Admitting: Hematology and Oncology

## 2013-11-01 ENCOUNTER — Other Ambulatory Visit (HOSPITAL_BASED_OUTPATIENT_CLINIC_OR_DEPARTMENT_OTHER): Payer: PRIVATE HEALTH INSURANCE

## 2013-11-01 ENCOUNTER — Encounter: Payer: Self-pay | Admitting: Hematology and Oncology

## 2013-11-01 VITALS — BP 188/85 | HR 78 | Temp 98.3°F | Resp 18 | Ht 65.0 in | Wt 206.9 lb

## 2013-11-01 DIAGNOSIS — T380X5A Adverse effect of glucocorticoids and synthetic analogues, initial encounter: Secondary | ICD-10-CM

## 2013-11-01 DIAGNOSIS — I10 Essential (primary) hypertension: Secondary | ICD-10-CM

## 2013-11-01 DIAGNOSIS — E249 Cushing's syndrome, unspecified: Secondary | ICD-10-CM

## 2013-11-01 DIAGNOSIS — D693 Immune thrombocytopenic purpura: Secondary | ICD-10-CM

## 2013-11-01 DIAGNOSIS — Z862 Personal history of diseases of the blood and blood-forming organs and certain disorders involving the immune mechanism: Secondary | ICD-10-CM

## 2013-11-01 LAB — CBC WITH DIFFERENTIAL/PLATELET
Basophils Absolute: 0 10*3/uL (ref 0.0–0.1)
HCT: 32.6 % — ABNORMAL LOW (ref 34.8–46.6)
HGB: 10.5 g/dL — ABNORMAL LOW (ref 11.6–15.9)
MONO#: 0.4 10*3/uL (ref 0.1–0.9)
MONO%: 5 % (ref 0.0–14.0)
NEUT%: 63.5 % (ref 38.4–76.8)
WBC: 8.8 10*3/uL (ref 3.9–10.3)
lymph#: 2.7 10*3/uL (ref 0.9–3.3)
nRBC: 0 % (ref 0–0)

## 2013-11-01 MED ORDER — PREDNISONE 2.5 MG PO TABS: 2.5000 mg | ORAL_TABLET | Freq: Every day | ORAL | Status: DC

## 2013-11-01 NOTE — Progress Notes (Signed)
[facility] OFFICE PROGRESS NOTE  [name], MD DIAGNOSIS:  Chronic ITP  SUMMARY OF HEMATOLOGIC HISTORY: #1 2012, she has thrombocytopenia that resolved spontaneously. The patient also recalled taking prednisone in the past for possible diagnosis of lupus #2 04/12/2013 she had mild microcytic anemia #3 07/09/2013 she has persistent mild microcytic anemia and thrombocytopenia with a platelet count of 51,000 #4 08/02/2013 I saw the patient and recommended a bone marrow aspirate and biopsy #5 08/06/2013 I perform bone marrow aspirate and biopsy. The patient received 1 unit of platelets for severe thrombocytopenia with a platelet count of 5000 #6 08/11/13: she is started on prednisone 80 mg daily #7 08/19/13: platelet count improves to 222. We appreciated steroid taper #8 09/02/2013 her platelet count dropped to 90,000. We increased prednisone back to 60 mg a day #9 09/16/13: dose of prednisone was reduced back to 40 mg #10 09/30/13: prednisone dose was reduced to 20 mg #11 10/14/13: prednisone dose was reduced to 10 mg #12 10/21/13: Prednisone dose was reduced to 5 mg #13 11/01/13: Prednisone dose was reduced to 2.5 mg INTERVAL HISTORY: [name] 72 y.o. female returns for further followup. She denies any recent fever, chills, night sweats or abnormal weight loss The patient denies any recent signs or symptoms of bleeding such as spontaneous epistaxis, hematuria or hematochezia. She felt that her edema is improving. She has high blood pressure but does not have any symptoms of headaches. The patient recalls eating something salty last night I have reviewed the past medical history, past surgical history, social history and family history with the patient and they are unchanged from previous note.  ALLERGIES:  is allergic to codeine; oxycodone; penicillins; sulfonamide derivatives; tramadol hcl; and triamcinolone.  MEDICATIONS:  Current Outpatient Prescriptions   Medication Sig Dispense Refill  . acetaminophen (TYLENOL) 325 MG tablet Take 650 mg by mouth every 6 (six) hours as needed.        Marland Kitchen amLODipine (NORVASC) 5 MG tablet Take 5 mg by mouth daily.      Marland Kitchen aspirin 81 MG tablet Take 81 mg by mouth daily.        . cholecalciferol (VITAMIN D) 400 UNITS TABS tablet Take 400 Units by mouth daily.      . citalopram (CELEXA) 20 MG tablet Take 20 mg by mouth daily.      . furosemide (LASIX) 20 MG tablet Take 20 mg by mouth daily.       . hyoscyamine (LEVSIN SL) 0.125 MG SL tablet Place 1 tablet (0.125 mg total) under the tongue every 4 (four) hours as needed for cramping.  30 tablet  2  . levothyroxine (SYNTHROID, LEVOTHROID) 100 MCG tablet Take 100 mcg by mouth daily.        Marland Kitchen loratadine (CLARITIN) 10 MG tablet Take 10 mg by mouth daily.      Marland Kitchen losartan (COZAAR) 100 MG tablet Take 100 mg by mouth daily.      Marland Kitchen lovastatin (MEVACOR) 40 MG tablet Take 40 mg by mouth at bedtime.        . pantoprazole (PROTONIX) 40 MG tablet Take 1 tablet (40 mg total) by mouth daily.  90 tablet  3  . potassium chloride SA (K-DUR,KLOR-CON) 20 MEQ tablet Take 20 mEq by mouth daily.      . predniSONE (DELTASONE) 20 MG tablet Take 5 mg by mouth daily. with breakfast      . senna (SENOKOT) 8.6 MG tablet Take 1 tablet by mouth as needed.       Marland Kitchen  predniSONE (DELTASONE) 2.5 MG tablet Take 1 tablet (2.5 mg total) by mouth daily with breakfast.  60 tablet  1   No current facility-administered medications for this visit.     REVIEW OF SYSTEMS:   Constitutional: Denies fevers, chills or night sweats Behavioral/Psych: Mood is stable, no new changes  All other systems were reviewed with the patient and are negative.  PHYSICAL EXAMINATION: ECOG PERFORMANCE STATUS: 0 - Asymptomatic  Filed Vitals:   11/01/13 0906  BP: 188/85  Pulse: 78  Temp: 98.3 F (36.8 C)  Resp: 18   Filed Weights   11/01/13 0906  Weight: 206 lb 14.4 oz (93.849 kg)    GENERAL:alert, no distress and  comfortable. She appears cushingoid Musculoskeletal:no cyanosis of digits and no clubbing  NEURO: alert & oriented x 3 with fluent speech, no focal motor/sensory deficits  LABORATORY DATA:  I have reviewed the data as listed Results for orders placed in visit on 11/01/13 (from the past 48 hour(s))  CBC WITH DIFFERENTIAL     Status: Abnormal   Collection Time    11/01/13  8:39 AM      Result Value Range   WBC 8.8  3.9 - 10.3 10e3/uL   NEUT# 5.6  1.5 - 6.5 10e3/uL   HGB 10.5 (*) 11.6 - 15.9 g/dL   HCT 16.1 (*) 09.6 - 04.5 %   Platelets 277  145 - 400 10e3/uL   MCV 76.5 (*) 79.5 - 101.0 fL   MCH 24.6 (*) 25.1 - 34.0 pg   MCHC 32.2  31.5 - 36.0 g/dL   RBC 4.09  8.11 - 9.14 10e6/uL   RDW 17.0 (*) 11.2 - 14.5 %   lymph# 2.7  0.9 - 3.3 10e3/uL   MONO# 0.4  0.1 - 0.9 10e3/uL   Eosinophils Absolute 0.0  0.0 - 0.5 10e3/uL   Basophils Absolute 0.0  0.0 - 0.1 10e3/uL   NEUT% 63.5  38.4 - 76.8 %   LYMPH% 30.9  14.0 - 49.7 %   MONO% 5.0  0.0 - 14.0 %   EOS% 0.5  0.0 - 7.0 %   BASO% 0.1  0.0 - 2.0 %   nRBC 0  0 - 0 %    Lab Results  Component Value Date   WBC 8.8 11/01/2013   HGB 10.5* 11/01/2013   HCT 32.6* 11/01/2013   MCV 76.5* 11/01/2013   PLT 277 11/01/2013   ASSESSMENT & PLAN:  #1 iron deficiency anemia Repeat iron studies showed that her ferritin is adequately replaced. The MCV is improving. Results of the endoscopy is still pending but so far all tests are negative. #2 ITP With high dose of prednisone, her platelet count has normalized. I recommend we continue on steroid taper to 2.5 mg start tomorrow. 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 Hopefully with the steroid taper it would improve #4 hypertension Her blood pressure is high but this could be related to effects of prednisone. Hopefully will continue to improve.  All questions were answered. The patient knows to call the clinic with any problems, questions or concerns. No barriers to learning was detected.  I  spent 15 minutes counseling the patient face to face. The total time spent in the appointment was 20 minutes and more than 50% was on counseling.     [facility], Etienne Millward, MD 11/01/2013 9:24 AM

## 2013-11-01 NOTE — Telephone Encounter (Signed)
Gave pt appt for lab and MD for January 2015 °

## 2013-11-15 ENCOUNTER — Telehealth: Payer: Self-pay | Admitting: Hematology and Oncology

## 2013-11-15 ENCOUNTER — Ambulatory Visit (HOSPITAL_BASED_OUTPATIENT_CLINIC_OR_DEPARTMENT_OTHER): Payer: PRIVATE HEALTH INSURANCE | Admitting: Hematology and Oncology

## 2013-11-15 ENCOUNTER — Encounter: Payer: Self-pay | Admitting: Hematology and Oncology

## 2013-11-15 ENCOUNTER — Other Ambulatory Visit (HOSPITAL_BASED_OUTPATIENT_CLINIC_OR_DEPARTMENT_OTHER): Payer: PRIVATE HEALTH INSURANCE

## 2013-11-15 VITALS — BP 164/88 | HR 85 | Temp 98.3°F | Resp 18 | Ht 65.0 in | Wt 206.6 lb

## 2013-11-15 DIAGNOSIS — E249 Cushing's syndrome, unspecified: Secondary | ICD-10-CM

## 2013-11-15 DIAGNOSIS — E876 Hypokalemia: Secondary | ICD-10-CM

## 2013-11-15 DIAGNOSIS — I10 Essential (primary) hypertension: Secondary | ICD-10-CM

## 2013-11-15 DIAGNOSIS — D509 Iron deficiency anemia, unspecified: Secondary | ICD-10-CM

## 2013-11-15 DIAGNOSIS — D693 Immune thrombocytopenic purpura: Secondary | ICD-10-CM

## 2013-11-15 HISTORY — DX: Hypokalemia: E87.6

## 2013-11-15 LAB — CBC WITH DIFFERENTIAL/PLATELET
BASO%: 1.1 % (ref 0.0–2.0)
BASOS ABS: 0.1 10*3/uL (ref 0.0–0.1)
EOS ABS: 0.1 10*3/uL (ref 0.0–0.5)
EOS%: 1 % (ref 0.0–7.0)
HEMATOCRIT: 32.8 % — AB (ref 34.8–46.6)
HEMOGLOBIN: 10.9 g/dL — AB (ref 11.6–15.9)
LYMPH%: 30.9 % (ref 14.0–49.7)
MCH: 25.4 pg (ref 25.1–34.0)
MCHC: 33.2 g/dL (ref 31.5–36.0)
MCV: 76.7 fL — AB (ref 79.5–101.0)
MONO#: 0.6 10*3/uL (ref 0.1–0.9)
MONO%: 7.4 % (ref 0.0–14.0)
NEUT#: 4.6 10*3/uL (ref 1.5–6.5)
NEUT%: 59.6 % (ref 38.4–76.8)
Platelets: 315 10*3/uL (ref 145–400)
RBC: 4.27 10*6/uL (ref 3.70–5.45)
RDW: 18 % — ABNORMAL HIGH (ref 11.2–14.5)
WBC: 7.7 10*3/uL (ref 3.9–10.3)
lymph#: 2.4 10*3/uL (ref 0.9–3.3)

## 2013-11-15 NOTE — Telephone Encounter (Signed)
Gave pt appt for lab and MD for March  2015 °

## 2013-11-15 NOTE — Progress Notes (Signed)
[facility] OFFICE PROGRESS NOTE  [name] Buddy, MD DIAGNOSIS:  ITP and anemia  SUMMARY OF HEMATOLOGIC HISTORY: #1 2012, she has thrombocytopenia that resolved spontaneously. The patient also recalled taking prednisone in the past for possible diagnosis of lupus #2 04/12/2013 she had mild microcytic anemia #3 07/09/2013 she has persistent mild microcytic anemia and thrombocytopenia with a platelet count of 51,000 #4 08/02/2013 I saw the patient and recommended a bone marrow aspirate and biopsy #5 08/06/2013 I perform bone marrow aspirate and biopsy. The patient received 1 unit of platelets for severe thrombocytopenia with a platelet count of 5000 #6 08/11/13: she is started on prednisone 80 mg daily #7 08/19/13: platelet count improves to 222. We appreciated steroid taper #8 09/02/2013 her platelet count dropped to 90,000. We increased prednisone back to 60 mg a day #9 09/16/13: dose of prednisone was reduced back to 40 mg #10 09/30/13: prednisone dose was reduced to 20 mg #11 10/14/13: prednisone dose was reduced to 10 mg #12 10/21/13: Prednisone dose was reduced to 5 mg #13 11/01/13: Prednisone dose was reduced to 2.5 mg #14 11/15/2013: Prednisone dose was reduced to 2.5 mg every other day INTERVAL HISTORY: [name] 73 y.o. female returns for further followup. She was recently found to have low potassium by her primary doctor. She have occasional incontinence. Denies any dysuria, frequency or urgency. Since we initiated prednisone taper, she complained of poor appetite. She has not lost any weight. The patient denies any recent signs or symptoms of bleeding such as spontaneous epistaxis, hematuria or hematochezia.  I have reviewed the past medical history, past surgical history, social history and family history with the patient and they are unchanged from previous note.  ALLERGIES:  is allergic to codeine; oxycodone; penicillins; sulfonamide derivatives; tramadol  hcl; and triamcinolone.  MEDICATIONS:  Current Outpatient Prescriptions  Medication Sig Dispense Refill  . acetaminophen (TYLENOL) 325 MG tablet Take 650 mg by mouth every 6 (six) hours as needed.        Marland Kitchen amLODipine (NORVASC) 5 MG tablet Take 5 mg by mouth daily.      Marland Kitchen aspirin 81 MG tablet Take 81 mg by mouth daily.        . cholecalciferol (VITAMIN D) 400 UNITS TABS tablet Take 400 Units by mouth daily.      . citalopram (CELEXA) 20 MG tablet Take 20 mg by mouth daily.      . furosemide (LASIX) 20 MG tablet Take 20 mg by mouth daily.       . hyoscyamine (LEVSIN SL) 0.125 MG SL tablet Place 1 tablet (0.125 mg total) under the tongue every 4 (four) hours as needed for cramping.  30 tablet  2  . levothyroxine (SYNTHROID, LEVOTHROID) 100 MCG tablet Take 100 mcg by mouth daily.        Marland Kitchen loratadine (CLARITIN) 10 MG tablet Take 10 mg by mouth daily.      Marland Kitchen losartan (COZAAR) 100 MG tablet Take 100 mg by mouth daily.      Marland Kitchen lovastatin (MEVACOR) 40 MG tablet Take 40 mg by mouth at bedtime.        . pantoprazole (PROTONIX) 40 MG tablet Take 1 tablet (40 mg total) by mouth daily.  90 tablet  3  . potassium chloride SA (K-DUR,KLOR-CON) 20 MEQ tablet Take 20 mEq by mouth daily.      . predniSONE (DELTASONE) 2.5 MG tablet Take 1 tablet (2.5 mg total) by mouth daily with breakfast.  60 tablet  1  . senna (SENOKOT) 8.6 MG tablet Take 1 tablet by mouth as needed.       . predniSONE (DELTASONE) 20 MG tablet Take 5 mg by mouth daily. with breakfast       No current facility-administered medications for this visit.     REVIEW OF SYSTEMS:   Constitutional: Denies fevers, chills or night sweats Eyes: Denies blurriness of vision Ears, nose, mouth, throat, and face: Denies mucositis or sore throat Respiratory: Denies cough, dyspnea or wheezes Cardiovascular: Denies palpitation, chest discomfort or lower extremity swelling Gastrointestinal:  Denies nausea, heartburn or change in bowel habits Skin: Denies  abnormal skin rashes Lymphatics: Denies new lymphadenopathy or easy bruising Neurological:Denies numbness, tingling or new weaknesses Behavioral/Psych: Mood is stable, no new changes  All other systems were reviewed with the patient and are negative.  PHYSICAL EXAMINATION: ECOG PERFORMANCE STATUS: 0 - Asymptomatic  Filed Vitals:   11/15/13 1433  BP: 164/88  Pulse: 85  Temp: 98.3 F (36.8 C)  Resp: 18   Filed Weights   11/15/13 1433  Weight: 206 lb 9.6 oz (93.713 kg)    GENERAL:alert, no distress and comfortable. She looked mildly cushingoid SKIN: skin color, texture, turgor are normal, no rashes or significant lesions EYES: normal, Conjunctiva are pink and non-injected, sclera clear OROPHARYNX:no exudate, no erythema and lips, buccal mucosa, and tongue normal  NECK: supple, thyroid normal size, non-tender, without nodularity LYMPH:  no palpable lymphadenopathy in the cervical, axillary or inguinal LUNGS: clear to auscultation and percussion with normal breathing effort HEART: regular rate & rhythm and no murmurs and no lower extremity edema ABDOMEN:abdomen soft, non-tender and normal bowel sounds Musculoskeletal:no cyanosis of digits and no clubbing  NEURO: alert & oriented x 3 with fluent speech, no focal motor/sensory deficits  LABORATORY DATA:  I have reviewed the data as listed Results for orders placed in visit on 11/15/13 (from the past 48 hour(s))  CBC WITH DIFFERENTIAL     Status: Abnormal   Collection Time    11/15/13  2:18 PM      Result Value Range   WBC 7.7  3.9 - 10.3 10e3/uL   NEUT# 4.6  1.5 - 6.5 10e3/uL   HGB 10.9 (*) 11.6 - 15.9 g/dL   HCT 04.532.8 (*) 40.934.8 - 81.146.6 %   Platelets 315  145 - 400 10e3/uL   MCV 76.7 (*) 79.5 - 101.0 fL   MCH 25.4  25.1 - 34.0 pg   MCHC 33.2  31.5 - 36.0 g/dL   RBC 9.144.27  7.823.70 - 9.565.45 10e6/uL   RDW 18.0 (*) 11.2 - 14.5 %   lymph# 2.4  0.9 - 3.3 10e3/uL   MONO# 0.6  0.1 - 0.9 10e3/uL   Eosinophils Absolute 0.1  0.0 - 0.5  10e3/uL   Basophils Absolute 0.1  0.0 - 0.1 10e3/uL   NEUT% 59.6  38.4 - 76.8 %   LYMPH% 30.9  14.0 - 49.7 %   MONO% 7.4  0.0 - 14.0 %   EOS% 1.0  0.0 - 7.0 %   BASO% 1.1  0.0 - 2.0 %    Lab Results  Component Value Date   WBC 7.7 11/15/2013   HGB 10.9* 11/15/2013   HCT 32.8* 11/15/2013   MCV 76.7* 11/15/2013   PLT 315 11/15/2013   ASSESSMENT & PLAN:  #1 iron deficiency anemia Repeat iron studies showed that her ferritin is adequately replaced. The MCV is improving. Results of the endoscopy is still pending but so far  all tests are negative. Recommend observation only #2 ITP With high dose of prednisone, her platelet count has normalized. I recommend we continue on steroid taper to 2.5 mg every other day start tomorrow. 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 Hopefully with the steroid taper it would improve #4 hypertension Her blood pressure is high but this could be related to effects of prednisone. Hopefully will continue to improve. #5 hypokalemia This could be due to recent diuretic treatment. In I will recheck complete metabolic panel with next visit. If her hypokalemia continue to be persistent, we might have to check her cortisol level to rule out adrenal insufficiency #6 urinary incontinence Cause is unknown. The most recent urine culture show no dominant growth. I recommend observation only.  All questions were answered. The patient knows to call the clinic with any problems, questions or concerns. No barriers to learning was detected.  I spent 15 minutes counseling the patient face to face. The total time spent in the appointment was 20 minutes and more than 50% was on counseling.     [facility], Asta Corbridge, MD 11/15/2013 2:48 PM

## 2013-12-27 ENCOUNTER — Ambulatory Visit (HOSPITAL_BASED_OUTPATIENT_CLINIC_OR_DEPARTMENT_OTHER): Payer: PRIVATE HEALTH INSURANCE | Admitting: Hematology and Oncology

## 2013-12-27 ENCOUNTER — Other Ambulatory Visit (HOSPITAL_BASED_OUTPATIENT_CLINIC_OR_DEPARTMENT_OTHER): Payer: PRIVATE HEALTH INSURANCE

## 2013-12-27 ENCOUNTER — Telehealth: Payer: Self-pay | Admitting: Hematology and Oncology

## 2013-12-27 ENCOUNTER — Encounter: Payer: Self-pay | Admitting: Hematology and Oncology

## 2013-12-27 ENCOUNTER — Telehealth: Payer: Self-pay | Admitting: *Deleted

## 2013-12-27 VITALS — BP 193/89 | HR 68 | Temp 97.3°F | Resp 18 | Ht 65.0 in | Wt 202.4 lb

## 2013-12-27 DIAGNOSIS — D693 Immune thrombocytopenic purpura: Secondary | ICD-10-CM

## 2013-12-27 DIAGNOSIS — E249 Cushing's syndrome, unspecified: Secondary | ICD-10-CM

## 2013-12-27 DIAGNOSIS — D509 Iron deficiency anemia, unspecified: Secondary | ICD-10-CM

## 2013-12-27 DIAGNOSIS — E876 Hypokalemia: Secondary | ICD-10-CM

## 2013-12-27 DIAGNOSIS — D649 Anemia, unspecified: Secondary | ICD-10-CM

## 2013-12-27 LAB — COMPREHENSIVE METABOLIC PANEL (CC13)
ALT: 9 U/L (ref 0–55)
AST: 13 U/L (ref 5–34)
Albumin: 3.6 g/dL (ref 3.5–5.0)
Alkaline Phosphatase: 99 U/L (ref 40–150)
Anion Gap: 10 mEq/L (ref 3–11)
BILIRUBIN TOTAL: 0.35 mg/dL (ref 0.20–1.20)
BUN: 10.6 mg/dL (ref 7.0–26.0)
CO2: 31 mEq/L — ABNORMAL HIGH (ref 22–29)
CREATININE: 1.1 mg/dL (ref 0.6–1.1)
Calcium: 10.5 mg/dL — ABNORMAL HIGH (ref 8.4–10.4)
Chloride: 103 mEq/L (ref 98–109)
Glucose: 89 mg/dl (ref 70–140)
Potassium: 2.6 mEq/L — CL (ref 3.5–5.1)
Sodium: 143 mEq/L (ref 136–145)
Total Protein: 7.1 g/dL (ref 6.4–8.3)

## 2013-12-27 LAB — CBC WITH DIFFERENTIAL/PLATELET
BASO%: 0.2 % (ref 0.0–2.0)
Basophils Absolute: 0 10*3/uL (ref 0.0–0.1)
EOS%: 0.5 % (ref 0.0–7.0)
Eosinophils Absolute: 0 10*3/uL (ref 0.0–0.5)
HEMATOCRIT: 33.8 % — AB (ref 34.8–46.6)
HGB: 10.8 g/dL — ABNORMAL LOW (ref 11.6–15.9)
LYMPH%: 47 % (ref 14.0–49.7)
MCH: 24.7 pg — ABNORMAL LOW (ref 25.1–34.0)
MCHC: 32 g/dL (ref 31.5–36.0)
MCV: 77.2 fL — AB (ref 79.5–101.0)
MONO#: 0.4 10*3/uL (ref 0.1–0.9)
MONO%: 9.7 % (ref 0.0–14.0)
NEUT#: 1.8 10*3/uL (ref 1.5–6.5)
NEUT%: 42.6 % (ref 38.4–76.8)
PLATELETS: 254 10*3/uL (ref 145–400)
RBC: 4.38 10*6/uL (ref 3.70–5.45)
RDW: 16.7 % — ABNORMAL HIGH (ref 11.2–14.5)
WBC: 4.2 10*3/uL (ref 3.9–10.3)
lymph#: 2 10*3/uL (ref 0.9–3.3)

## 2013-12-27 MED ORDER — POTASSIUM CHLORIDE CRYS ER 20 MEQ PO TBCR
20.0000 meq | EXTENDED_RELEASE_TABLET | Freq: Two times a day (BID) | ORAL | Status: DC
Start: 2013-12-27 — End: 2014-06-26

## 2013-12-27 NOTE — Telephone Encounter (Signed)
Gave pt appt for lab andf MD for [facility] 2015

## 2013-12-27 NOTE — Telephone Encounter (Signed)
Called patient to let her know that Dr Bertis RuddyGorsuch has sent in a refill for her potassium. She is to take twice a day.Pt verbalized understanding.

## 2013-12-27 NOTE — Addendum Note (Signed)
Addended byBertis Ruddy: Idaly Verret on: 12/27/2013 10:17 AM   Modules accepted: Orders

## 2013-12-27 NOTE — Progress Notes (Signed)
Repeat potassium level came back low at 2.6. Sodium level is normal. I would proceed to replace potassium and recheck her cortisol level with the next visit.

## 2013-12-27 NOTE — Progress Notes (Signed)
[facility] OFFICE PROGRESS NOTE  [name] Buddy, MD DIAGNOSIS:  History of severe ITP and anemia, on prednisone  SUMMARY OF HEMATOLOGIC HISTORY: #1 2012, she has thrombocytopenia that resolved spontaneously. The patient also recalled taking prednisone in the past for possible diagnosis of lupus #2 04/12/2013 she had mild microcytic anemia #3 07/09/2013 she has persistent mild microcytic anemia and thrombocytopenia with a platelet count of 51,000 #4 08/02/2013 I saw the patient and recommended a bone marrow aspirate and biopsy #5 08/06/2013 I perform bone marrow aspirate and biopsy. The patient received 1 unit of platelets for severe thrombocytopenia with a platelet count of 5000 #6 08/11/13: she is started on prednisone 80 mg daily #7 08/19/13: platelet count improves to 222. We appreciated steroid taper #8 09/02/2013 her platelet count dropped to 90,000. We increased prednisone back to 60 mg a day #9 09/16/13: dose of prednisone was reduced back to 40 mg #10 09/30/13: prednisone dose was reduced to 20 mg #11 10/14/13: prednisone dose was reduced to 10 mg #12 10/21/13: Prednisone dose was reduced to 5 mg #13 11/01/13: Prednisone dose was reduced to 2.5 mg #14 11/15/2013: Prednisone dose was reduced to 2.5 mg every other day #15 on 12/27/2013, prednisone is discontinued INTERVAL HISTORY: [name] 73 y.o. female returns for further followup. She denies any headaches or chest pain from high blood pressure. She has lost 4 pounds of weight since the last time I saw her and her facial swelling is improving. The patient denies any recent signs or symptoms of bleeding such as spontaneous epistaxis, hematuria or hematochezia. I have reviewed the past medical history, past surgical history, social history and family history with the patient and they are unchanged from previous note.  ALLERGIES:  is allergic to codeine; oxycodone; penicillins; sulfonamide derivatives; tramadol hcl;  and triamcinolone.  MEDICATIONS:  Current Outpatient Prescriptions  Medication Sig Dispense Refill  . acetaminophen (TYLENOL) 325 MG tablet Take 650 mg by mouth every 6 (six) hours as needed.        Marland Kitchen amLODipine (NORVASC) 5 MG tablet Take 5 mg by mouth daily.      Marland Kitchen aspirin 81 MG tablet Take 81 mg by mouth daily.        . cholecalciferol (VITAMIN D) 400 UNITS TABS tablet Take 400 Units by mouth daily.      . citalopram (CELEXA) 20 MG tablet Take 20 mg by mouth daily.      . furosemide (LASIX) 20 MG tablet Take 20 mg by mouth daily.       . hyoscyamine (LEVSIN SL) 0.125 MG SL tablet Place 1 tablet (0.125 mg total) under the tongue every 4 (four) hours as needed for cramping.  30 tablet  2  . levothyroxine (SYNTHROID, LEVOTHROID) 100 MCG tablet Take 100 mcg by mouth daily.        Marland Kitchen loratadine (CLARITIN) 10 MG tablet Take 10 mg by mouth daily.      Marland Kitchen losartan (COZAAR) 100 MG tablet Take 100 mg by mouth daily.      Marland Kitchen lovastatin (MEVACOR) 40 MG tablet Take 40 mg by mouth at bedtime.        . pantoprazole (PROTONIX) 40 MG tablet Take 1 tablet (40 mg total) by mouth daily.  90 tablet  3  . predniSONE (DELTASONE) 2.5 MG tablet Take 1 tablet (2.5 mg total) by mouth daily with breakfast.  60 tablet  1  . predniSONE (DELTASONE) 20 MG tablet Take 5 mg by mouth daily. with  breakfast      . senna (SENOKOT) 8.6 MG tablet Take 1 tablet by mouth as needed.       . potassium chloride SA (K-DUR,KLOR-CON) 20 MEQ tablet Take 20 mEq by mouth daily.       No current facility-administered medications for this visit.     REVIEW OF SYSTEMS:   Constitutional: Denies fevers, chills or night sweats Eyes: Denies blurriness of vision Ears, nose, mouth, throat, and face: Denies mucositis or sore throat Respiratory: Denies cough, dyspnea or wheezes Cardiovascular: Denies palpitation, chest discomfort or lower extremity swelling Gastrointestinal:  Denies nausea, heartburn or change in bowel habits Skin: Denies  abnormal skin rashes Lymphatics: Denies new lymphadenopathy or easy bruising Neurological:Denies numbness, tingling or new weaknesses Behavioral/Psych: Mood is stable, no new changes  All other systems were reviewed with the patient and are negative.  PHYSICAL EXAMINATION: ECOG PERFORMANCE STATUS: 0 - Asymptomatic  Filed Vitals:   12/27/13 0916  BP: 193/89  Pulse: 68  Temp: 97.3 F (36.3 C)  Resp: 18   Filed Weights   12/27/13 0916  Weight: 202 lb 6.4 oz (91.808 kg)    GENERAL:alert, no distress and comfortable. She looks cushingoid SKIN: skin color, texture, turgor are normal, no rashes or significant lesions Musculoskeletal:no cyanosis of digits and no clubbing  NEURO: alert & oriented x 3 with fluent speech, no focal motor/sensory deficits  LABORATORY DATA:  I have reviewed the data as listed Results for orders placed in visit on 12/27/13 (from the past 48 hour(s))  CBC WITH DIFFERENTIAL     Status: Abnormal   Collection Time    12/27/13  9:01 AM      Result Value Ref Range   WBC 4.2  3.9 - 10.3 10e3/uL   NEUT# 1.8  1.5 - 6.5 10e3/uL   HGB 10.8 (*) 11.6 - 15.9 g/dL   HCT 98.133.8 (*) 19.134.8 - 47.846.6 %   Platelets 254  145 - 400 10e3/uL   MCV 77.2 (*) 79.5 - 101.0 fL   MCH 24.7 (*) 25.1 - 34.0 pg   MCHC 32.0  31.5 - 36.0 g/dL   RBC 2.954.38  6.213.70 - 3.085.45 10e6/uL   RDW 16.7 (*) 11.2 - 14.5 %   lymph# 2.0  0.9 - 3.3 10e3/uL   MONO# 0.4  0.1 - 0.9 10e3/uL   Eosinophils Absolute 0.0  0.0 - 0.5 10e3/uL   Basophils Absolute 0.0  0.0 - 0.1 10e3/uL   NEUT% 42.6  38.4 - 76.8 %   LYMPH% 47.0  14.0 - 49.7 %   MONO% 9.7  0.0 - 14.0 %   EOS% 0.5  0.0 - 7.0 %   BASO% 0.2  0.0 - 2.0 %    Lab Results  Component Value Date   WBC 4.2 12/27/2013   HGB 10.8* 12/27/2013   HCT 33.8* 12/27/2013   MCV 77.2* 12/27/2013   PLT 254 12/27/2013   ASSESSMENT & PLAN:  #1 iron deficiency anemia Repeat iron studies showed that her ferritin is adequately replaced. The MCV is improving. EGD was negative  Recommend observation only #2 ITP With high dose of prednisone, her platelet count has normalized. I recommend we discontinue prednisone and see her back in one month to follow. 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 Hopefully with the steroid taper it would improve #4 hypertension Her blood pressure is high. I recommended she increase amlodipine  To 5 mg 2 times a day #5 hypokalemia This could be due to recent diuretic  treatment. Repeat labs today still pending. If it is low, I would check a cortisol level with the next visit to rule out adrenal insufficiency All questions were answered. The patient knows to call the clinic with any problems, questions or concerns. No barriers to learning was detected.  I spent 15 minutes counseling the patient face to face. The total time spent in the appointment was 20 minutes and more than 50% was on counseling.     Delene Morais, MD 12/27/2013 9:50 AM

## 2014-01-24 ENCOUNTER — Other Ambulatory Visit: Payer: Self-pay | Admitting: Hematology and Oncology

## 2014-01-24 ENCOUNTER — Telehealth: Payer: Self-pay | Admitting: Hematology and Oncology

## 2014-01-24 ENCOUNTER — Ambulatory Visit (HOSPITAL_BASED_OUTPATIENT_CLINIC_OR_DEPARTMENT_OTHER): Payer: PRIVATE HEALTH INSURANCE | Admitting: Hematology and Oncology

## 2014-01-24 ENCOUNTER — Telehealth: Payer: Self-pay | Admitting: *Deleted

## 2014-01-24 ENCOUNTER — Encounter: Payer: Self-pay | Admitting: Hematology and Oncology

## 2014-01-24 ENCOUNTER — Other Ambulatory Visit (HOSPITAL_BASED_OUTPATIENT_CLINIC_OR_DEPARTMENT_OTHER): Payer: PRIVATE HEALTH INSURANCE

## 2014-01-24 VITALS — BP 161/83 | HR 79 | Temp 98.4°F | Resp 18 | Ht 65.0 in | Wt 201.9 lb

## 2014-01-24 DIAGNOSIS — E249 Cushing's syndrome, unspecified: Secondary | ICD-10-CM

## 2014-01-24 DIAGNOSIS — D649 Anemia, unspecified: Secondary | ICD-10-CM

## 2014-01-24 DIAGNOSIS — E876 Hypokalemia: Secondary | ICD-10-CM

## 2014-01-24 DIAGNOSIS — D509 Iron deficiency anemia, unspecified: Secondary | ICD-10-CM

## 2014-01-24 DIAGNOSIS — T380X5A Adverse effect of glucocorticoids and synthetic analogues, initial encounter: Secondary | ICD-10-CM

## 2014-01-24 DIAGNOSIS — I10 Essential (primary) hypertension: Secondary | ICD-10-CM

## 2014-01-24 LAB — BASIC METABOLIC PANEL (CC13)
Anion Gap: 11 mEq/L (ref 3–11)
BUN: 15.8 mg/dL (ref 7.0–26.0)
CHLORIDE: 107 meq/L (ref 98–109)
CO2: 24 meq/L (ref 22–29)
Calcium: 11.1 mg/dL — ABNORMAL HIGH (ref 8.4–10.4)
Creatinine: 1.1 mg/dL (ref 0.6–1.1)
GLUCOSE: 88 mg/dL (ref 70–140)
Potassium: 3.5 mEq/L (ref 3.5–5.1)
Sodium: 142 mEq/L (ref 136–145)

## 2014-01-24 LAB — CBC WITH DIFFERENTIAL/PLATELET
BASO%: 1.3 % (ref 0.0–2.0)
BASOS ABS: 0.1 10*3/uL (ref 0.0–0.1)
EOS ABS: 0.1 10*3/uL (ref 0.0–0.5)
EOS%: 1.7 % (ref 0.0–7.0)
HEMATOCRIT: 34.5 % — AB (ref 34.8–46.6)
HEMOGLOBIN: 11.3 g/dL — AB (ref 11.6–15.9)
LYMPH#: 1.8 10*3/uL (ref 0.9–3.3)
LYMPH%: 31.9 % (ref 14.0–49.7)
MCH: 24.8 pg — ABNORMAL LOW (ref 25.1–34.0)
MCHC: 32.6 g/dL (ref 31.5–36.0)
MCV: 76.1 fL — ABNORMAL LOW (ref 79.5–101.0)
MONO#: 0.3 10*3/uL (ref 0.1–0.9)
MONO%: 5.3 % (ref 0.0–14.0)
NEUT#: 3.5 10*3/uL (ref 1.5–6.5)
NEUT%: 59.8 % (ref 38.4–76.8)
PLATELETS: 261 10*3/uL (ref 145–400)
RBC: 4.54 10*6/uL (ref 3.70–5.45)
RDW: 18.7 % — ABNORMAL HIGH (ref 11.2–14.5)
WBC: 5.8 10*3/uL (ref 3.9–10.3)

## 2014-01-24 LAB — CORTISOL: CORTISOL PLASMA: 7 ug/dL

## 2014-01-24 LAB — FERRITIN CHCC: Ferritin: 83 ng/ml (ref 9–269)

## 2014-01-24 LAB — IRON AND TIBC CHCC
%SAT: 21 % (ref 21–57)
Iron: 54 ug/dL (ref 41–142)
TIBC: 258 ug/dL (ref 236–444)
UIBC: 204 ug/dL (ref 120–384)

## 2014-01-24 NOTE — Telephone Encounter (Signed)
Message copied by Rolanda JayWINDHAM, Merlene Dante C on Mon Jan 24, 2014 12:53 PM ------      Message from: [facility] LauderdaleGORSUCH, NI      Created: Mon Jan 24, 2014 11:04 AM      Regarding: test result       Please tell pt to increase Fluids intake. Calcium is high for unknown reason. K is now normal, she can stop K supplement.      She needs to tell her PCP to reorder labs and start workup for hypercalcemia ------

## 2014-01-24 NOTE — Telephone Encounter (Signed)
Gave pt appt for lab and MD for june 2015 °

## 2014-01-24 NOTE — Telephone Encounter (Signed)
Instructed pt on elevated calcium level and for Dr. Hyman HopesWebb to re check and work up. She states she has appt w/ him in 2 days.   Also informed her potassium is wnl and she can stop potassium pills.  Instructed to drink plenty of fluids to help the calcium level.  She verbalized understanding.  Called Dr. Marland McalpineWebb's office and left VM for his assistant informing of elevated calcium level and faxed over today's office note and labs to his office for his review.

## 2014-01-24 NOTE — Progress Notes (Signed)
[facility] OFFICE PROGRESS NOTE  [name],[name] W, MD DIAGNOSIS:  History of ITP, resolved and anemia of chronic disease  SUMMARY OF HEMATOLOGIC HISTORY: #1 2012, she has thrombocytopenia that resolved spontaneously. The patient also recalled taking prednisone in the past for possible diagnosis of lupus #2 04/12/2013 she had mild microcytic anemia #3 07/09/2013 she has persistent mild microcytic anemia and thrombocytopenia with a platelet count of 51,000 #4 08/02/2013 I saw the patient and recommended a bone marrow aspirate and biopsy #5 08/06/2013 I perform bone marrow aspirate and biopsy. The patient received 1 unit of platelets for severe thrombocytopenia with a platelet count of 5000 #6 08/11/13: she is started on prednisone 80 mg daily #7 08/19/13: platelet count improves to 222. We appreciated steroid taper #8 09/02/2013 her platelet count dropped to 90,000. We increased prednisone back to 60 mg a day #9 09/16/13: dose of prednisone was reduced back to 40 mg #10 09/30/13: prednisone dose was reduced to 20 mg #11 10/14/13: prednisone dose was reduced to 10 mg #12 10/21/13: Prednisone dose was reduced to 5 mg #13 11/01/13: Prednisone dose was reduced to 2.5 mg #14 11/15/2013: Prednisone dose was reduced to 2.5 mg every other day #15 on 12/27/2013, prednisone is discontinued INTERVAL HISTORY: [name] 73 y.o. female returns for further followup. She has mild fatigue since discontinuation of prednisone. Her weight has been stable. She felt that the fluid retention is improving. She denies any headache or chest pain from high blood pressure.  I have reviewed the past medical history, past surgical history, social history and family history with the patient and they are unchanged from previous note.  ALLERGIES:  is allergic to codeine; oxycodone; penicillins; sulfonamide derivatives; tramadol hcl; and triamcinolone.  MEDICATIONS:  Current Outpatient Prescriptions   Medication Sig Dispense Refill  . acetaminophen (TYLENOL) 325 MG tablet Take 650 mg by mouth every 6 (six) hours as needed.        Marland Kitchen. amLODipine (NORVASC) 5 MG tablet Take 5 mg by mouth daily.      Marland Kitchen. aspirin 81 MG tablet Take 81 mg by mouth daily.        . cholecalciferol (VITAMIN D) 1000 UNITS tablet Take 1,000 Units by mouth daily.      . citalopram (CELEXA) 20 MG tablet Take 20 mg by mouth daily.      . furosemide (LASIX) 20 MG tablet Take 20 mg by mouth daily.       . hyoscyamine (LEVSIN SL) 0.125 MG SL tablet Place 1 tablet (0.125 mg total) under the tongue every 4 (four) hours as needed for cramping.  30 tablet  2  . levothyroxine (SYNTHROID, LEVOTHROID) 100 MCG tablet Take 100 mcg by mouth daily.        Marland Kitchen. loratadine (CLARITIN) 10 MG tablet Take 10 mg by mouth daily.      Marland Kitchen. losartan (COZAAR) 100 MG tablet Take 100 mg by mouth daily.      Marland Kitchen. lovastatin (MEVACOR) 40 MG tablet Take 40 mg by mouth at bedtime.        . pantoprazole (PROTONIX) 40 MG tablet Take 1 tablet (40 mg total) by mouth daily.  90 tablet  3  . potassium chloride SA (K-DUR,KLOR-CON) 20 MEQ tablet Take 20 mEq by mouth daily.      . potassium chloride SA (K-DUR,KLOR-CON) 20 MEQ tablet Take 1 tablet (20 mEq total) by mouth 2 (two) times daily.  60 tablet  1  . senna (SENOKOT) 8.6 MG tablet  Take 1 tablet by mouth as needed.        No current facility-administered medications for this visit.     REVIEW OF SYSTEMS:   Constitutional: Denies fevers, chills or night sweats Eyes: Denies blurriness of vision Ears, nose, mouth, throat, and face: Denies mucositis or sore throat Respiratory: Denies cough, dyspnea or wheezes Cardiovascular: Denies palpitation, chest discomfort or lower extremity swelling Gastrointestinal:  Denies nausea, heartburn or change in bowel habits Skin: Denies abnormal skin rashes Lymphatics: Denies new lymphadenopathy or easy bruising Neurological:Denies numbness, tingling or new  weaknesses Behavioral/Psych: Mood is stable, no new changes  All other systems were reviewed with the patient and are negative.  PHYSICAL EXAMINATION: ECOG PERFORMANCE STATUS: 0 - Asymptomatic  Filed Vitals:   01/24/14 0941  BP: 161/83  Pulse: 79  Temp: 98.4 F (36.9 C)  Resp: 18   Filed Weights   01/24/14 0941  Weight: 201 lb 14.4 oz (91.581 kg)    GENERAL:alert, no distress and comfortable. She looked mildly cushingoid SKIN: skin color, texture, turgor are normal, no rashes or significant lesions Musculoskeletal:no cyanosis of digits and no clubbing  NEURO: alert & oriented x 3 with fluent speech, no focal motor/sensory deficits  LABORATORY DATA:  I have reviewed the data as listed Results for orders placed in visit on 01/24/14 (from the past 48 hour(s))  CBC WITH DIFFERENTIAL     Status: Abnormal   Collection Time    01/24/14  9:30 AM      Result Value Ref Range   WBC 5.8  3.9 - 10.3 10e3/uL   NEUT# 3.5  1.5 - 6.5 10e3/uL   HGB 11.3 (*) 11.6 - 15.9 g/dL   HCT 16.1 (*) 09.6 - 04.5 %   Platelets 261  145 - 400 10e3/uL   MCV 76.1 (*) 79.5 - 101.0 fL   MCH 24.8 (*) 25.1 - 34.0 pg   MCHC 32.6  31.5 - 36.0 g/dL   RBC 4.09  8.11 - 9.14 10e6/uL   RDW 18.7 (*) 11.2 - 14.5 %   lymph# 1.8  0.9 - 3.3 10e3/uL   MONO# 0.3  0.1 - 0.9 10e3/uL   Eosinophils Absolute 0.1  0.0 - 0.5 10e3/uL   Basophils Absolute 0.1  0.0 - 0.1 10e3/uL   NEUT% 59.8  38.4 - 76.8 %   LYMPH% 31.9  14.0 - 49.7 %   MONO% 5.3  0.0 - 14.0 %   EOS% 1.7  0.0 - 7.0 %   BASO% 1.3  0.0 - 2.0 %    Lab Results  Component Value Date   WBC 5.8 01/24/2014   HGB 11.3* 01/24/2014   HCT 34.5* 01/24/2014   MCV 76.1* 01/24/2014   PLT 261 01/24/2014    ASSESSMENT & PLAN:  #1 iron deficiency anemia Repeat iron studies showed that her ferritin is adequately replaced. The MCV is improving. EGD was negative Recommend observation only #2 ITP, resolved With high dose of prednisone, her platelet count has normalized.  Since discontinuation of prednisone, her platelets remained stable. 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 Hopefully with the steroid taper it would improve #4 hypertension Her blood pressure is high. I recommended she increase amlodipine to 5 mg 2 times a day and follow with PCP #5 hypokalemia This could be due to recent diuretic treatment. Repeat labs today still pending. I will call pt with results  All questions were answered. The patient knows to call  the clinic with any problems, questions or concerns. No barriers to learning was detected.  I spent 15 minutes counseling the patient face to face. The total time spent in the appointment was 20 minutes and more than 50% was on counseling.    Ola Fawver, MD 01/24/2014 10:04 AM

## 2014-04-11 ENCOUNTER — Telehealth: Payer: Self-pay | Admitting: Hematology and Oncology

## 2014-04-11 NOTE — Telephone Encounter (Signed)
no vm returned pt call....no answer

## 2014-04-26 ENCOUNTER — Telehealth: Payer: Self-pay | Admitting: Hematology and Oncology

## 2014-04-26 ENCOUNTER — Encounter: Payer: Self-pay | Admitting: Hematology and Oncology

## 2014-04-26 ENCOUNTER — Other Ambulatory Visit (HOSPITAL_BASED_OUTPATIENT_CLINIC_OR_DEPARTMENT_OTHER): Payer: PRIVATE HEALTH INSURANCE

## 2014-04-26 ENCOUNTER — Telehealth: Payer: Self-pay | Admitting: *Deleted

## 2014-04-26 ENCOUNTER — Ambulatory Visit (HOSPITAL_BASED_OUTPATIENT_CLINIC_OR_DEPARTMENT_OTHER): Payer: PRIVATE HEALTH INSURANCE | Admitting: Hematology and Oncology

## 2014-04-26 VITALS — BP 181/82 | HR 71 | Temp 97.6°F | Resp 16 | Wt 198.0 lb

## 2014-04-26 DIAGNOSIS — D649 Anemia, unspecified: Secondary | ICD-10-CM

## 2014-04-26 DIAGNOSIS — D509 Iron deficiency anemia, unspecified: Secondary | ICD-10-CM

## 2014-04-26 DIAGNOSIS — E876 Hypokalemia: Secondary | ICD-10-CM

## 2014-04-26 DIAGNOSIS — D693 Immune thrombocytopenic purpura: Secondary | ICD-10-CM

## 2014-04-26 DIAGNOSIS — I10 Essential (primary) hypertension: Secondary | ICD-10-CM

## 2014-04-26 LAB — CBC WITH DIFFERENTIAL/PLATELET
BASO%: 0.3 % (ref 0.0–2.0)
Basophils Absolute: 0 10*3/uL (ref 0.0–0.1)
EOS%: 3 % (ref 0.0–7.0)
Eosinophils Absolute: 0.2 10*3/uL (ref 0.0–0.5)
HCT: 31.7 % — ABNORMAL LOW (ref 34.8–46.6)
HGB: 10.5 g/dL — ABNORMAL LOW (ref 11.6–15.9)
LYMPH%: 36.4 % (ref 14.0–49.7)
MCH: 24.8 pg — ABNORMAL LOW (ref 25.1–34.0)
MCHC: 33.1 g/dL (ref 31.5–36.0)
MCV: 75 fL — ABNORMAL LOW (ref 79.5–101.0)
MONO#: 0.3 10*3/uL (ref 0.1–0.9)
MONO%: 5.8 % (ref 0.0–14.0)
NEUT#: 3.1 10*3/uL (ref 1.5–6.5)
NEUT%: 54.5 % (ref 38.4–76.8)
Platelets: 326 10*3/uL (ref 145–400)
RBC: 4.23 10*6/uL (ref 3.70–5.45)
RDW: 18.6 % — ABNORMAL HIGH (ref 11.2–14.5)
WBC: 5.7 10*3/uL (ref 3.9–10.3)
lymph#: 2.1 10*3/uL (ref 0.9–3.3)

## 2014-04-26 LAB — BASIC METABOLIC PANEL (CC13)
Anion Gap: 9 mEq/L (ref 3–11)
BUN: 12.3 mg/dL (ref 7.0–26.0)
CO2: 28 mEq/L (ref 22–29)
Calcium: 10 mg/dL (ref 8.4–10.4)
Chloride: 106 mEq/L (ref 98–109)
Creatinine: 1 mg/dL (ref 0.6–1.1)
Glucose: 101 mg/dl (ref 70–140)
Potassium: 3 mEq/L — CL (ref 3.5–5.1)
Sodium: 144 mEq/L (ref 136–145)

## 2014-04-26 NOTE — Telephone Encounter (Signed)
gv and printed appts sched and avs for pt for Sept °

## 2014-04-26 NOTE — Telephone Encounter (Signed)
Informed pt of low Potassium level today and to take Kdur 20 meq twice daily x 7 days.  Pt states has bottle of kdur 20 Meq at home, only 13 tabs left.  Instructed to take twice daily until gone, this will be almost  7 full days.  Instructed per Dr. Bertis RuddyGorsuch to f/u w/ PCP to recheck and monitor her potassium level.  Pt verbalized understanding.  Office note and labs faxed to Dr. Marland McalpineWebb's office fax [phone].

## 2014-04-26 NOTE — Progress Notes (Signed)
[facility] OFFICE PROGRESS NOTE  [name],[name], MD DIAGNOSIS:  ITP, resolved  SUMMARY OF HEMATOLOGIC HISTORY: #1 2012, she has thrombocytopenia that resolved spontaneously. The patient also recalled taking prednisone in the past for possible diagnosis of lupus #2 04/12/2013 she had mild microcytic anemia #3 07/09/2013 she has persistent mild microcytic anemia and thrombocytopenia with a platelet count of 51,000 #4 08/02/2013 I saw the patient and recommended a bone marrow aspirate and biopsy #5 08/06/2013 I perform bone marrow aspirate and biopsy. The patient received 1 unit of platelets for severe thrombocytopenia with a platelet count of 5000 #6 08/11/13: she is started on prednisone 80 mg daily #7 08/19/13: platelet count improves to 222. We appreciated steroid taper #8 09/02/2013 her platelet count dropped to 90,000. We increased prednisone back to 60 mg a day #9 09/16/13: dose of prednisone was reduced back to 40 mg #10 09/30/13: prednisone dose was reduced to 20 mg #11 10/14/13: prednisone dose was reduced to 10 mg #12 10/21/13: Prednisone dose was reduced to 5 mg #13 11/01/13: Prednisone dose was reduced to 2.5 mg #14 11/15/2013: Prednisone dose was reduced to 2.5 mg every other day #15 12/27/2013, prednisone is discontinued INTERVAL HISTORY: [name] FantasiaGloria Gould 73 y.o. female returns for further followup. She complained of fatigue. She recently injured her head and felt that her memory is worse. Denies any focal neurological deficits. I have reviewed the past medical history, past surgical history, social history and family history with the patient and they are unchanged from previous note.  ALLERGIES:  is allergic to codeine; oxycodone; penicillins; sulfonamide derivatives; tramadol hcl; and triamcinolone.  MEDICATIONS:  Current Outpatient Prescriptions  Medication Sig Dispense Refill  . amLODipine (NORVASC) 5 MG tablet Take 5 mg by mouth daily.      .  cholecalciferol (VITAMIN D) 1000 UNITS tablet Take 1,000 Units by mouth daily.      . citalopram (CELEXA) 20 MG tablet Take 20 mg by mouth daily.      . furosemide (LASIX) 20 MG tablet Take 20 mg by mouth daily.       . hyoscyamine (LEVSIN SL) 0.125 MG SL tablet Place 1 tablet (0.125 mg total) under the tongue every 4 (four) hours as needed for cramping.  30 tablet  2  . levothyroxine (SYNTHROID, LEVOTHROID) 100 MCG tablet Take 100 mcg by mouth daily.        Marland Kitchen. loratadine (CLARITIN) 10 MG tablet Take 10 mg by mouth daily.      Marland Kitchen. losartan (COZAAR) 100 MG tablet Take 100 mg by mouth daily.      Marland Kitchen. lovastatin (MEVACOR) 40 MG tablet Take 40 mg by mouth at bedtime.        . pantoprazole (PROTONIX) 40 MG tablet Take 1 tablet (40 mg total) by mouth daily.  90 tablet  3  . senna (SENOKOT) 8.6 MG tablet Take 1 tablet by mouth as needed.       Marland Kitchen. acetaminophen (TYLENOL) 325 MG tablet Take 650 mg by mouth every 6 (six) hours as needed.        Marland Kitchen. aspirin 81 MG tablet Take 81 mg by mouth daily.        . potassium chloride SA (K-DUR,KLOR-CON) 20 MEQ tablet Take 20 mEq by mouth daily.      . potassium chloride SA (K-DUR,KLOR-CON) 20 MEQ tablet Take 1 tablet (20 mEq total) by mouth 2 (two) times daily.  60 tablet  1   No current facility-administered medications for this  visit.     REVIEW OF SYSTEMS:   Constitutional: Denies fevers, chills or night sweats Eyes: Denies blurriness of vision Ears, nose, mouth, throat, and face: Denies mucositis or sore throat Respiratory: Denies cough, dyspnea or wheezes Cardiovascular: Denies palpitation, chest discomfort or lower extremity swelling Gastrointestinal:  Denies nausea, heartburn or change in bowel habits Skin: Denies abnormal skin rashes Lymphatics: Denies new lymphadenopathy or easy bruising Neurological:Denies numbness, tingling or new weaknesses Behavioral/Psych: Mood is stable, no new changes  All other systems were reviewed with the patient and are  negative.  PHYSICAL EXAMINATION: ECOG PERFORMANCE STATUS: 0 - Asymptomatic  Filed Vitals:   04/26/14 1016  BP: 181/82  Pulse: 71  Temp: 97.6 F (36.4 C)  Resp: 16   Filed Weights   04/26/14 1022  Weight: 198 lb (89.812 kg)    GENERAL:alert, no distress and comfortable. She is mildly obese SKIN: skin color, texture, turgor are normal, no rashes or significant lesions EYES: normal, Conjunctiva are pink and non-injected, sclera clear OROPHARYNX:no exudate, no erythema and lips, buccal mucosa, and tongue normal  NECK: supple, thyroid normal size, non-tender, without nodularity LYMPH:  no palpable lymphadenopathy in the cervical, axillary or inguinal LUNGS: clear to auscultation and percussion with normal breathing effort HEART: regular rate & rhythm and no murmurs and no lower extremity edema ABDOMEN:abdomen soft, non-tender and normal bowel sounds Musculoskeletal:no cyanosis of digits and no clubbing  NEURO: alert & oriented x 3 with fluent speech, no focal motor/sensory deficits  LABORATORY DATA:  I have reviewed the data as listed Results for orders placed in visit on 04/26/14 (from the past 48 hour(s))  BASIC METABOLIC PANEL (CC13)     Status: Abnormal   Collection Time    04/26/14 10:06 AM      Result Value Ref Range   Sodium 144  136 - 145 mEq/L   Potassium 3.0 (*) 3.5 - 5.1 mEq/L   Chloride 106  98 - 109 mEq/L   CO2 28  22 - 29 mEq/L   Glucose 101  70 - 140 mg/dl   BUN 16.1  7.0 - 09.6 mg/dL   Creatinine 1.0  0.6 - 1.1 mg/dL   Calcium 04.5  8.4 - 40.9 mg/dL   Anion Gap 9  3 - 11 mEq/L  CBC WITH DIFFERENTIAL     Status: Abnormal   Collection Time    04/26/14 10:06 AM      Result Value Ref Range   WBC 5.7  3.9 - 10.3 10e3/uL   NEUT# 3.1  1.5 - 6.5 10e3/uL   HGB 10.5 (*) 11.6 - 15.9 g/dL   HCT 81.1 (*) 91.4 - 78.2 %   Platelets 326  145 - 400 10e3/uL   MCV 75.0 (*) 79.5 - 101.0 fL   MCH 24.8 (*) 25.1 - 34.0 pg   MCHC 33.1  31.5 - 36.0 g/dL   RBC 9.56  2.13 -  0.86 10e6/uL   RDW 18.6 (*) 11.2 - 14.5 %   lymph# 2.1  0.9 - 3.3 10e3/uL   MONO# 0.3  0.1 - 0.9 10e3/uL   Eosinophils Absolute 0.2  0.0 - 0.5 10e3/uL   Basophils Absolute 0.0  0.0 - 0.1 10e3/uL   NEUT% 54.5  38.4 - 76.8 %   LYMPH% 36.4  14.0 - 49.7 %   MONO% 5.8  0.0 - 14.0 %   EOS% 3.0  0.0 - 7.0 %   BASO% 0.3  0.0 - 2.0 %    Lab Results  Component Value Date   WBC 5.7 04/26/2014   HGB 10.5* 04/26/2014   HCT 31.7* 04/26/2014   MCV 75.0* 04/26/2014   PLT 326 04/26/2014    ASSESSMENT & PLAN:  ITP (idiopathic thrombocytopenic purpura) Her platelet count remains stable. I will see her back again in 3 months with history, physical examination and blood work.  HYPERTENSION I recommend she follows with primary care provider for blood pressure medication adjustment.  Iron deficiency anemia, unspecified This could be due to anemia chronic disease. I will recheck iron studies at the next visit.  Hypokalemia This is low. I will give her a prescription of potassium replacement therapy and recommend recheck blood work with her primary care provider.    All questions were answered. The patient knows to call the clinic with any problems, questions or concerns. No barriers to learning was detected.  I spent 15 minutes counseling the patient face to face. The total time spent in the appointment was 20 minutes and more than 50% was on counseling.     [facility] IncGORSUCH, Tiyah Zelenak, MD 04/26/2014 12:35 PM

## 2014-04-26 NOTE — Assessment & Plan Note (Signed)
This is low. I will give her a prescription of potassium replacement therapy and recommend recheck blood work with her primary care provider.

## 2014-04-26 NOTE — Assessment & Plan Note (Signed)
This could be due to anemia chronic disease. I will recheck iron studies at the next visit.

## 2014-04-26 NOTE — Assessment & Plan Note (Signed)
Her platelet count remains stable. I will see her back again in 3 months with history, physical examination and blood work.

## 2014-04-26 NOTE — Assessment & Plan Note (Signed)
I recommend she follows with primary care provider for blood pressure medication adjustment.

## 2014-05-23 ENCOUNTER — Other Ambulatory Visit: Payer: Self-pay | Admitting: Nephrology

## 2014-05-23 DIAGNOSIS — N63 Unspecified lump in unspecified breast: Secondary | ICD-10-CM

## 2014-05-30 ENCOUNTER — Ambulatory Visit
Admission: RE | Admit: 2014-05-30 | Discharge: 2014-05-30 | Disposition: A | Discharge status: Home / Self Care | Payer: Medicaid Other | Source: Ambulatory Visit | Attending: Nephrology | Admitting: Nephrology

## 2014-05-30 DIAGNOSIS — N63 Unspecified lump in unspecified breast: Secondary | ICD-10-CM

## 2014-06-26 ENCOUNTER — Other Ambulatory Visit: Payer: Self-pay | Admitting: Hematology and Oncology

## 2014-06-28 ENCOUNTER — Other Ambulatory Visit: Payer: Self-pay | Admitting: *Deleted

## 2014-06-28 DIAGNOSIS — D693 Immune thrombocytopenic purpura: Secondary | ICD-10-CM

## 2014-06-28 MED ORDER — POTASSIUM CHLORIDE CRYS ER 20 MEQ PO TBCR: 20.0000 meq | EXTENDED_RELEASE_TABLET | Freq: Two times a day (BID) | ORAL | Status: DC

## 2014-07-29 ENCOUNTER — Ambulatory Visit (HOSPITAL_BASED_OUTPATIENT_CLINIC_OR_DEPARTMENT_OTHER): Payer: PRIVATE HEALTH INSURANCE | Admitting: Hematology and Oncology

## 2014-07-29 ENCOUNTER — Telehealth: Payer: Self-pay | Admitting: Hematology and Oncology

## 2014-07-29 ENCOUNTER — Encounter: Payer: Self-pay | Admitting: Hematology and Oncology

## 2014-07-29 ENCOUNTER — Other Ambulatory Visit (HOSPITAL_BASED_OUTPATIENT_CLINIC_OR_DEPARTMENT_OTHER): Payer: PRIVATE HEALTH INSURANCE

## 2014-07-29 ENCOUNTER — Telehealth: Payer: Self-pay | Admitting: *Deleted

## 2014-07-29 VITALS — BP 190/83 | HR 76 | Temp 98.2°F | Resp 18 | Ht 65.0 in | Wt 197.2 lb

## 2014-07-29 DIAGNOSIS — D509 Iron deficiency anemia, unspecified: Secondary | ICD-10-CM

## 2014-07-29 DIAGNOSIS — D693 Immune thrombocytopenic purpura: Secondary | ICD-10-CM

## 2014-07-29 DIAGNOSIS — I10 Essential (primary) hypertension: Secondary | ICD-10-CM

## 2014-07-29 LAB — BASIC METABOLIC PANEL (CC13)
Anion Gap: 8 mEq/L (ref 3–11)
BUN: 15.9 mg/dL (ref 7.0–26.0)
CALCIUM: 10 mg/dL (ref 8.4–10.4)
CO2: 26 mEq/L (ref 22–29)
CREATININE: 1.2 mg/dL — AB (ref 0.6–1.1)
Chloride: 105 mEq/L (ref 98–109)
Glucose: 102 mg/dl (ref 70–140)
Potassium: 3.6 mEq/L (ref 3.5–5.1)
Sodium: 139 mEq/L (ref 136–145)

## 2014-07-29 LAB — CBC WITH DIFFERENTIAL/PLATELET
BASO%: 0.6 % (ref 0.0–2.0)
BASOS ABS: 0 10*3/uL (ref 0.0–0.1)
EOS%: 2.8 % (ref 0.0–7.0)
Eosinophils Absolute: 0.2 10*3/uL (ref 0.0–0.5)
HCT: 32.2 % — ABNORMAL LOW (ref 34.8–46.6)
HEMOGLOBIN: 10.3 g/dL — AB (ref 11.6–15.9)
LYMPH#: 2.1 10*3/uL (ref 0.9–3.3)
LYMPH%: 38 % (ref 14.0–49.7)
MCH: 23.8 pg — AB (ref 25.1–34.0)
MCHC: 32 g/dL (ref 31.5–36.0)
MCV: 74.5 fL — ABNORMAL LOW (ref 79.5–101.0)
MONO#: 0.2 10*3/uL (ref 0.1–0.9)
MONO%: 4.4 % (ref 0.0–14.0)
NEUT#: 2.9 10*3/uL (ref 1.5–6.5)
NEUT%: 54.2 % (ref 38.4–76.8)
PLATELETS: 248 10*3/uL (ref 145–400)
RBC: 4.32 10*6/uL (ref 3.70–5.45)
RDW: 18 % — ABNORMAL HIGH (ref 11.2–14.5)
WBC: 5.4 10*3/uL (ref 3.9–10.3)

## 2014-07-29 LAB — IRON AND TIBC CHCC
%SAT: 16 % — AB (ref 21–57)
Iron: 37 ug/dL — ABNORMAL LOW (ref 41–142)
TIBC: 235 ug/dL — ABNORMAL LOW (ref 236–444)
UIBC: 198 ug/dL (ref 120–384)

## 2014-07-29 LAB — FERRITIN CHCC: Ferritin: 56 ng/ml (ref 9–269)

## 2014-07-29 NOTE — Assessment & Plan Note (Signed)
This could be due to anemia chronic disease. I will recheck iron studies at the next visit. 

## 2014-07-29 NOTE — Telephone Encounter (Signed)
gv adn printed appt sched and avs for pt for March 2016 °

## 2014-07-29 NOTE — Assessment & Plan Note (Signed)
Her platelet count remains stable. I will see her back again in 6 months with history, physical examination and blood work.

## 2014-07-29 NOTE — Telephone Encounter (Signed)
Message copied by Rolanda Jay on Fri Jul 29, 2014 12:22 PM ------      Message from: [facility] [place]      Created: Fri Jul 29, 2014 11:55 AM      Regarding: low iron       Tell her to continue on iron supplement 1 at bedtime      ----- Message -----         From: [facility]         Sent: 07/29/2014  10:43 AM           To: Artis Delay, MD                   ------

## 2014-07-29 NOTE — Progress Notes (Signed)
[facility] OFFICE PROGRESS NOTE  [name] Buddy, MD SUMMARY OF HEMATOLOGIC HISTORY: #1 2012, she has thrombocytopenia that resolved spontaneously. The patient also recalled taking prednisone in the past for possible diagnosis of lupus #2 04/12/2013 she had mild microcytic anemia #3 07/09/2013 she has persistent mild microcytic anemia and thrombocytopenia with a platelet count of 51,000 #4 08/02/2013 I saw the patient and recommended a bone marrow aspirate and biopsy #5 08/06/2013 I perform bone marrow aspirate and biopsy. The patient received 1 unit of platelets for severe thrombocytopenia with a platelet count of 5000 #6 08/11/13: she is started on prednisone 80 mg daily #7 08/19/13: platelet count improves to 222. We appreciated steroid taper #8 09/02/2013 her platelet count dropped to 90,000. We increased prednisone back to 60 mg a day #9 09/16/13: dose of prednisone was reduced back to 40 mg #10 09/30/13: prednisone dose was reduced to 20 mg #11 10/14/13: prednisone dose was reduced to 10 mg #12 10/21/13: Prednisone dose was reduced to 5 mg #13 11/01/13: Prednisone dose was reduced to 2.5 mg #14 11/15/2013: Prednisone dose was reduced to 2.5 mg every other day #15 12/27/2013, prednisone is discontinued INTERVAL HISTORY: [name] 73 y.o. female returns for further followup. She complained of some mild headaches. She stated that her blood pressure home was under control. She complained of mild arthritis pain.  I have reviewed the past medical history, past surgical history, social history and family history with the patient and they are unchanged from previous note.  ALLERGIES:  is allergic to codeine; oxycodone; penicillins; sulfonamide derivatives; tramadol hcl; and triamcinolone.  MEDICATIONS:  Current Outpatient Prescriptions  Medication Sig Dispense Refill  . acetaminophen (TYLENOL) 325 MG tablet Take 650 mg by mouth every 6 (six) hours as needed.        Marland Kitchen  amLODipine (NORVASC) 5 MG tablet Take 5 mg by mouth daily.      Marland Kitchen aspirin 81 MG tablet Take 81 mg by mouth daily.        . cholecalciferol (VITAMIN D) 1000 UNITS tablet Take 1,000 Units by mouth daily.      . citalopram (CELEXA) 20 MG tablet Take 20 mg by mouth daily.      . furosemide (LASIX) 20 MG tablet Take 20 mg by mouth daily.       . hyoscyamine (LEVSIN SL) 0.125 MG SL tablet Place 1 tablet (0.125 mg total) under the tongue every 4 (four) hours as needed for cramping.  30 tablet  2  . KLOR-CON M20 20 MEQ tablet TAKE 1 TABLET (20 MEQ TOTAL) BY MOUTH 2 (TWO) TIMES DAILY.  60 tablet  1  . levothyroxine (SYNTHROID, LEVOTHROID) 100 MCG tablet Take 100 mcg by mouth daily.        Marland Kitchen loratadine (CLARITIN) 10 MG tablet Take 10 mg by mouth daily.      Marland Kitchen losartan (COZAAR) 100 MG tablet Take 100 mg by mouth daily.      Marland Kitchen lovastatin (MEVACOR) 40 MG tablet Take 40 mg by mouth at bedtime.        . pantoprazole (PROTONIX) 40 MG tablet Take 1 tablet (40 mg total) by mouth daily.  90 tablet  3  . potassium chloride SA (K-DUR,KLOR-CON) 20 MEQ tablet Take 1 tablet (20 mEq total) by mouth 2 (two) times daily.  90 tablet  0  . senna (SENOKOT) 8.6 MG tablet Take 1 tablet by mouth as needed.        No current facility-administered medications  for this visit.     REVIEW OF SYSTEMS:   Constitutional: Denies fevers, chills or night sweats Eyes: Denies blurriness of vision Ears, nose, mouth, throat, and face: Denies mucositis or sore throat Respiratory: Denies cough, dyspnea or wheezes Cardiovascular: Denies palpitation, chest discomfort or lower extremity swelling Gastrointestinal:  Denies nausea, heartburn or change in bowel habits Skin: Denies abnormal skin rashes Lymphatics: Denies new lymphadenopathy or easy bruising Neurological:Denies numbness, tingling or new weaknesses Behavioral/Psych: Mood is stable, no new changes  All other systems were reviewed with the patient and are negative.  PHYSICAL  EXAMINATION: ECOG PERFORMANCE STATUS: 1 - Symptomatic but completely ambulatory  Filed Vitals:   07/29/14 1042  BP: 190/83  Pulse: 76  Temp: 98.2 F (36.8 C)  Resp: 18   Filed Weights   07/29/14 1042  Weight: 197 lb 3.2 oz (89.449 kg)    GENERAL:alert, no distress and comfortable SKIN: skin color, texture, turgor are normal, no rashes or significant lesions EYES: normal, Conjunctiva are pink and non-injected, sclera clear OROPHARYNX:no exudate, no erythema and lips, buccal mucosa, and tongue normal  NECK: supple, thyroid normal size, non-tender, without nodularity LYMPH:  no palpable lymphadenopathy in the cervical, axillary or inguinal LUNGS: clear to auscultation and percussion with normal breathing effort HEART: regular rate & rhythm and no murmurs with mild bilateral lower extremity edema ABDOMEN:abdomen soft, non-tender and normal bowel sounds Musculoskeletal:no cyanosis of digits and no clubbing  NEURO: alert & oriented x 3 with fluent speech, no focal motor/sensory deficits  LABORATORY DATA:  I have reviewed the data as listed Results for orders placed in visit on 07/29/14 (from the past 48 hour(s))  CBC WITH DIFFERENTIAL     Status: Abnormal   Collection Time    07/29/14 10:30 AM      Result Value Ref Range   WBC 5.4  3.9 - 10.3 10e3/uL   NEUT# 2.9  1.5 - 6.5 10e3/uL   HGB 10.3 (*) 11.6 - 15.9 g/dL   HCT 16.1 (*) 09.6 - 04.5 %   Platelets 248  145 - 400 10e3/uL   MCV 74.5 (*) 79.5 - 101.0 fL   MCH 23.8 (*) 25.1 - 34.0 pg   MCHC 32.0  31.5 - 36.0 g/dL   RBC 4.09  8.11 - 9.14 10e6/uL   RDW 18.0 (*) 11.2 - 14.5 %   lymph# 2.1  0.9 - 3.3 10e3/uL   MONO# 0.2  0.1 - 0.9 10e3/uL   Eosinophils Absolute 0.2  0.0 - 0.5 10e3/uL   Basophils Absolute 0.0  0.0 - 0.1 10e3/uL   NEUT% 54.2  38.4 - 76.8 %   LYMPH% 38.0  14.0 - 49.7 %   MONO% 4.4  0.0 - 14.0 %   EOS% 2.8  0.0 - 7.0 %   BASO% 0.6  0.0 - 2.0 %  FERRITIN [facility]     Status: None   Collection Time    07/29/14  10:30 AM      Result Value Ref Range   Ferritin 56  9 - 269 ng/ml  IRON AND TIBC [facility]     Status: Abnormal   Collection Time    07/29/14 10:30 AM      Result Value Ref Range   Iron 37 (*) 41 - 142 ug/dL   TIBC 782 (*) 956 - 213 ug/dL   UIBC 086  578 - 469 ug/dL   %SAT 16 (*) 21 - 57 %  BASIC METABOLIC PANEL (CC13)     Status:  Abnormal   Collection Time    07/29/14 10:30 AM      Result Value Ref Range   Sodium 139  136 - 145 mEq/L   Potassium 3.6  3.5 - 5.1 mEq/L   Chloride 105  98 - 109 mEq/L   CO2 26  22 - 29 mEq/L   Glucose 102  70 - 140 mg/dl   BUN 62.1  7.0 - 30.8 mg/dL   Creatinine 1.2 (*) 0.6 - 1.1 mg/dL   Calcium 65.7  8.4 - 84.6 mg/dL   Anion Gap 8  3 - 11 mEq/L    Lab Results  Component Value Date   WBC 5.4 07/29/2014   HGB 10.3* 07/29/2014   HCT 32.2* 07/29/2014   MCV 74.5* 07/29/2014   PLT 248 07/29/2014    ASSESSMENT & PLAN:  ITP (idiopathic thrombocytopenic purpura) Her platelet count remains stable. I will see her back again in 6 months with history, physical examination and blood work.  Iron deficiency anemia, unspecified This could be due to anemia chronic disease. I will recheck iron studies at the next visit.  HYPERTENSION I recommend she follows with primary care provider for blood pressure medication adjustment.    All questions were answered. The patient knows to call the clinic with any problems, questions or concerns. No barriers to learning was detected.  I spent 15 minutes counseling the patient face to face. The total time spent in the appointment was 20 minutes and more than 50% was on counseling.     [facility], Tekila Caillouet, MD 07/29/2014 4:46 PM

## 2014-07-29 NOTE — Assessment & Plan Note (Signed)
I recommend she follows with primary care provider for blood pressure medication adjustment. 

## 2014-07-29 NOTE — Telephone Encounter (Signed)
Informed pt of low iron level and to take iron supplement at bedtime per Dr. Bertis Ruddy.  She verbalized understanding.

## 2014-08-18 ENCOUNTER — Other Ambulatory Visit: Payer: Self-pay | Admitting: Emergency Medicine

## 2014-09-07 ENCOUNTER — Encounter: Payer: Self-pay | Admitting: Emergency Medicine

## 2014-09-07 ENCOUNTER — Ambulatory Visit (INDEPENDENT_AMBULATORY_CARE_PROVIDER_SITE_OTHER): Payer: Medicare Other | Admitting: Emergency Medicine

## 2014-09-07 VITALS — BP 162/100 | HR 73 | Temp 97.0°F | Ht 69.0 in | Wt 200.2 lb

## 2014-09-07 DIAGNOSIS — J479 Bronchiectasis, uncomplicated: Secondary | ICD-10-CM

## 2014-09-07 NOTE — Patient Instructions (Signed)
Please continue your current medications as you are taking them Follow with Dr Delton CoombesByrum in 1 year or sooner if you develop any changes in your breathing or cough.

## 2014-09-07 NOTE — Assessment & Plan Note (Signed)
Appears to be clinically stable. No evidence of exacerbation or progression. I believe that we can defer any imaging at this time. We will check a repeat chest x-ray at her follow-up visit in a year and depending on her symptoms we will decide whether she needs a repeat CT scan of the chest. She will call me if she develops a change in her respiratory symptoms sooner. If so then we will perform the CT scan at that time.

## 2014-09-07 NOTE — Progress Notes (Signed)
[name] is a 73 year old woman with a history of hypertension, hypercholesterolemia, hypothyroidism, allergic rhinitis, and renal insufficiency followed by Dr. Elvis CoilMartin Webb. I saw her in 2007-8 for pulmonary nodules, mediastinal LAD and basilar bronchiectasis on CT scan. Bx of an axillary node showed no evidence for sarcoidosis.   Re-establishing care 03/22/10 -- Had an URI in Feb-March 2011, also some allergies. Used claritin, currently off as her symptoms improved. Still feeling a bit fatigued - being followed by Dr Hyman HopesWebb for anemia and hypokalemia, planning to GI eval for anemia. Reports continued globus sensation. Clears her throat and prod of clear to yellow. Has seen a streak of red in it before. No real cough, just throat clearing.   ROV 04/06/10 -- returns to discuss her bronchiectasis and cough. We started claritin last time, didn't need the flonase. Cough is better, globus sensation is better but not gone completely. Seems to be resolving. Reviewed CT scan of the chest - no change in bronchiectasis.   ROV 05/29/11 -- hx bronchiectasis and cough, allergies. Also followed for HTN, renal insufficiency. Planning for L TKR w Dr Eulah PontMurphy, scheduled for August 22. Her cough is stable - seems to bother her when exposed to the [facility]. No flares, no abx, no prednisone for breathing.   ROV 06/04/12 -- hx bronchiectasis and cough, allergies.  Since last time she has had successful L knee sgy. She is using loratadine during the spring and fall. Still gets cough when in the [facility] or heat. No flares, no pred or abx.   ROV 06/10/13 -- hx bronchiectasis, nodular disease and cough, allergies. Last CT chest  12/19/10.  She has been doing well. She has cough when the [facility] and the Heat are running. She has acid reflux - planning EGD w Dr Arlyce DiceKaplan, not currently on PPI. Some nasal congestion.   ROV 09/07/14 -- follow up visit for bronchiectasis, Nodular disease, cough, allergic rhinitis.  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  She has noticed some increase in her daily cough since she turned her heat on.   Filed Vitals:   09/07/14 1514  BP: 162/100  Pulse: 73  Temp: 97 F (36.1 C)  TempSrc: Oral  Height: 5\' 9"  (1.753 m)  Weight: 200 lb 3.2 oz (90.81 kg)  SpO2: 99%   Gen: Pleasant, well-nourished, in no distress,  normal affect  ENT: No lesions,  mouth clear,  oropharynx clear, no postnasal drip  Neck: No JVD, no TMG, no carotid bruits  Lungs: No use of accessory muscles, no dullness to percussion, clear without rales or rhonchi  Cardiovascular: RRR, heart sounds normal, no murmur or gallops, no peripheral edema  Musculoskeletal: No deformities, no cyanosis or clubbing  Neuro: alert, non focal  Skin: Warm, no lesions or rashes   CXR 06/10/13 --  Comparison: Chest x-ray 06/28/2011.  Findings: Lung volumes are normal. No acute consolidative air  space disease. No definite pleural effusions. No evidence of  pulmonary edema. No definite suspicious appearing pulmonary  nodules or masses are identified on this plain film examination.  No evidence of pulmonary edema. Borderline cardiomegaly is  unchanged. Extensive atherosclerosis of the thoracic aorta.  IMPRESSION:  1. No radiographic evidence of acute cardiopulmonary disease.  2. Atherosclerosis.  3. Borderline cardiomegaly.     CT scan chest 12/19/10: Spirometry today  = normal airflow Comparison: 05/16/2011and 09/12/2008.  Findings: No pathologically enlarged mediastinal lymph nodes.  Right hilar lymph nodes  are sub centimeter in size and unchanged  from 09/12/2008, as is an 11 mm short axis left axillary lymph  node. There is atherosclerotic calcification of the arterial  vasculature. Ascending aorta measures 4.0 cm (previously 3.8 cm).  Coronary artery calcification. Heart is mildly enlarged. No  pericardial effusion.  There are scattered parenchymal subpleural pulmonary nodules which  are  unchanged from 09/12/2008, and are therefore considered benign.  Question mild subpleural reticulation at the lung bases. Scarring  in both lower lobes. No pleural fluid. Airway is unremarkable.  Incidental imaging of the upper abdomen shows a 1.6 cm low  attenuation lesion in the periphery of the left hepatic lobe.  There may be an exophytic low attenuation lesion off the upper pole  left kidney measuring 2 cm, which is incompletely imaged. No  worrisome lytic or sclerotic lesions.  Review of the MIP images confirms the above findings.  IMPRESSION:  1. Minimally prominent ascending aorta.  2. Coronary artery calcification.  3. Question mild basilar subpleural fibrosis.   BRONCHIECTASIS Appears to be clinically stable. No evidence of exacerbation or progression. I believe that we can defer any imaging at this time. We will check a repeat chest x-ray at her follow-up visit in a year and depending on her symptoms we will decide whether she needs a repeat CT scan of the chest. She will call me if she develops a change in her respiratory symptoms sooner. If so then we will perform the CT scan at that time.

## 2014-09-08 ENCOUNTER — Other Ambulatory Visit: Payer: Self-pay | Admitting: Gastroenterology

## 2014-09-12 ENCOUNTER — Other Ambulatory Visit: Payer: Self-pay | Admitting: Emergency Medicine

## 2014-10-21 ENCOUNTER — Other Ambulatory Visit: Payer: Self-pay | Admitting: Emergency Medicine

## 2014-11-24 ENCOUNTER — Other Ambulatory Visit: Payer: Self-pay | Admitting: Hematology and Oncology

## 2014-11-24 NOTE — Telephone Encounter (Signed)
Per Dr. Bertis RuddyGorsuch, her PCP needs to manage this medication

## 2014-11-27 ENCOUNTER — Other Ambulatory Visit: Payer: Self-pay | Admitting: Hematology and Oncology

## 2015-01-20 ENCOUNTER — Encounter: Payer: Self-pay | Admitting: Adult Health

## 2015-01-20 ENCOUNTER — Ambulatory Visit (INDEPENDENT_AMBULATORY_CARE_PROVIDER_SITE_OTHER): Payer: Medicare Other | Admitting: Adult Health

## 2015-01-20 ENCOUNTER — Ambulatory Visit (INDEPENDENT_AMBULATORY_CARE_PROVIDER_SITE_OTHER)
Admission: RE | Admit: 2015-01-20 | Discharge: 2015-01-20 | Disposition: A | Discharge status: Home / Self Care | Payer: Medicare Other | Source: Ambulatory Visit | Attending: Adult Health | Admitting: Adult Health

## 2015-01-20 VITALS — BP 122/70 | HR 82 | Temp 98.5°F | Ht 66.0 in | Wt 197.2 lb

## 2015-01-20 DIAGNOSIS — J479 Bronchiectasis, uncomplicated: Secondary | ICD-10-CM | POA: Diagnosis not present

## 2015-01-20 MED ORDER — HYDROCODONE-HOMATROPINE 5-1.5 MG/5ML PO SYRP: 5.0000 mL | ORAL_SOLUTION | Freq: Four times a day (QID) | ORAL | Status: DC | PRN

## 2015-01-20 MED ORDER — AZITHROMYCIN 250 MG PO TABS: ORAL_TABLET | ORAL | Status: AC

## 2015-01-20 NOTE — Patient Instructions (Addendum)
Zpack take as directed Mucinex DM Twice daily  As needed  Cough/congestion  Hydromet As needed  Cough , may make you sleepy.  Please contact office for sooner follow up if symptoms do not improve or worsen or seek emergency care  follow up Dr. Delton CoombesByrum  As planned and As needed

## 2015-01-24 NOTE — Progress Notes (Signed)
[name] is a 74 year old woman with a history of hypertension, hypercholesterolemia, hypothyroidism, allergic rhinitis, and renal insufficiency followed by Dr. Elvis CoilMartin Webb. [name] saw [name] in 2007-8 for pulmonary nodules, mediastinal LAD and basilar bronchiectasis on CT scan. Bx of an axillary node showed no evidence for sarcoidosis.   Re-establishing care 03/22/10 -- Had an URI in Feb-March 2011, also some allergies. Used claritin, currently off as [name] symptoms improved. Still feeling a bit fatigued - being followed by Dr Hyman HopesWebb for anemia and hypokalemia, planning to GI eval for anemia. Reports continued globus sensation. Clears [name] throat and prod of clear to yellow. Has seen a streak of red in it before. No real cough, just throat clearing.   ROV 04/06/10 -- returns to discuss [name] bronchiectasis and cough. We started claritin last time, didn't need the flonase. Cough is better, globus sensation is better but not gone completely. Seems to be resolving. Reviewed CT scan of the chest - no change in bronchiectasis.   ROV 05/29/11 -- hx bronchiectasis and cough, allergies. Also followed for HTN, renal insufficiency. Planning for L TKR w Dr Eulah PontMurphy, scheduled for August 22. [name] cough is stable - seems to bother [name] when exposed to [facility]. No flares, no abx, no prednisone for breathing.   ROV 06/04/12 -- hx bronchiectasis and cough, allergies.  Since last time she has had successful L knee sgy. She is using loratadine during the spring and fall. Still gets cough when [place] [facility] or [place]. No flares, no pred or abx.   ROV 06/10/13 -- hx bronchiectasis, nodular disease and cough, allergies. Last CT chest  12/19/10.  She has been doing well. She has cough when the [facility] and the [facility] are running. She has acid reflux - planning EGD w Dr Arlyce DiceKaplan, not currently on PPI. Some nasal congestion.   ROV 09/07/14 -- follow up visit for bronchiectasis, Nodular disease, cough, allergic rhinitis.  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  She has noticed some increase in [name] daily cough since she turned [name] heat on.   01/20/15 Acute OV : bronchiectasis, Nodular disease, cough, allergic rhinitis. Complains of wheezing, tightness, increased SOB, prod cough with some green mucus, fever, with blood tinged mucus. Cough is keeping [name] up at night.  CXR shows w/ no acute ov.  No recent abx use.  Using mucinex .  Denies chest pain, orthopnea, edema , n/v/d.  Appetite is good.    ROS Constitutional:   No  weight loss, night sweats,  Fevers, chills +, fatigue, or  lassitude.  HEENT:   No headaches,  Difficulty swallowing,  Tooth/dental problems, or  Sore throat,                No sneezing, itching, ear ache, + nasal congestion, post nasal drip,   CV:  No chest pain,  Orthopnea, PND, swelling in lower extremities, anasarca, dizziness, palpitations, syncope.   GI  No heartburn, indigestion, abdominal pain, nausea, vomiting, diarrhea, change in bowel habits, loss of appetite, bloody stools.   Resp:   No chest wall deformity  Skin: no rash or lesions.  GU: no dysuria, change in color of urine, no urgency or frequency.  No flank pain, no hematuria   MS:  No joint pain or swelling.  No decreased range of motion.  No back pain.  Psych:  No change in mood or affect. No depression or anxiety.  No memory loss.  Filed Vitals:   01/20/15 1435  BP: 122/70  Pulse: 82  Temp: 98.5 F (36.9 C)  TempSrc: Oral  Height:  (1.676 m)  Weight: 197 lb 3.2 oz (89.449 kg)  SpO2: 100%   Gen: Pleasant, elderly , in no distress,  normal affect  ENT: No lesions,  mouth clear,  oropharynx clear, no postnasal drip  Neck: No JVD, no TMG, no carotid bruits  Lungs: No use of accessory muscles, no dullness to percussion, clear without rales or rhonchi  Cardiovascular: RRR, heart sounds normal, no murmur or gallops, no peripheral edema  Musculoskeletal: No deformities, no cyanosis or  clubbing  Neuro: alert, non focal  Skin: Warm, no lesions or rashes   CXR 06/10/13 --  Comparison: Chest x-ray 06/28/2011.  Findings: Lung volumes are normal. No acute consolidative air  space disease. No definite pleural effusions. No evidence of  pulmonary edema. No definite suspicious appearing pulmonary  nodules or masses are identified on this plain film examination.  No evidence of pulmonary edema. Borderline cardiomegaly is  unchanged. Extensive atherosclerosis of the thoracic aorta.  IMPRESSION:  1. No radiographic evidence of acute cardiopulmonary disease.  2. Atherosclerosis.  3. Borderline cardiomegaly.     CT scan chest 12/19/10: Spirometry today  = normal airflow Comparison: 05/16/2011and 09/12/2008.  Findings: No pathologically enlarged mediastinal lymph nodes.  Right hilar lymph nodes are sub centimeter in size and unchanged  from 09/12/2008, as is an 11 mm short axis left axillary lymph  node. There is atherosclerotic calcification of the arterial  vasculature. Ascending aorta measures 4.0 cm (previously 3.8 cm).  Coronary artery calcification. Heart is mildly enlarged. No  pericardial effusion.  There are scattered parenchymal subpleural pulmonary nodules which  are unchanged from 09/12/2008, and are therefore considered benign.  Question mild subpleural reticulation at the lung bases. Scarring  in both lower lobes. No pleural fluid. Airway is unremarkable.  Incidental imaging of the upper abdomen shows a 1.6 cm low  attenuation lesion in the periphery of the left hepatic lobe.  There may be an exophytic low attenuation lesion off the upper pole  left kidney measuring 2 cm, which is incompletely imaged. No  worrisome lytic or sclerotic lesions.  Review of the MIP images confirms the above findings.  IMPRESSION:  1. Minimally prominent ascending aorta.  2. Coronary artery calcification.  3. Question mild basilar subpleural fibrosis.   No problem-specific  assessment & plan notes found for this encounter.   CXR 01/20/15 >No radiographic evidence of acute cardiopulmonary disease.

## 2015-01-24 NOTE — Assessment & Plan Note (Addendum)
Flare -mild  CXR with no sign of PNA   Plan  Zpack take as directed Mucinex DM Twice daily  As needed  Cough/congestion  Hydromet As needed  Cough , may make you sleepy.  Please contact office for sooner follow up if symptoms do not improve or worsen or seek emergency care  follow up Dr. Delton CoombesByrum  As planned and As needed

## 2015-01-27 ENCOUNTER — Ambulatory Visit (HOSPITAL_BASED_OUTPATIENT_CLINIC_OR_DEPARTMENT_OTHER): Payer: Medicare Other | Admitting: Hematology and Oncology

## 2015-01-27 ENCOUNTER — Other Ambulatory Visit (HOSPITAL_BASED_OUTPATIENT_CLINIC_OR_DEPARTMENT_OTHER): Payer: Medicare Other

## 2015-01-27 ENCOUNTER — Telehealth: Payer: Self-pay | Admitting: Hematology and Oncology

## 2015-01-27 ENCOUNTER — Encounter: Payer: Self-pay | Admitting: Hematology and Oncology

## 2015-01-27 VITALS — BP 166/81 | HR 67 | Temp 98.0°F | Resp 18 | Ht 66.0 in | Wt 194.4 lb

## 2015-01-27 DIAGNOSIS — D693 Immune thrombocytopenic purpura: Secondary | ICD-10-CM

## 2015-01-27 DIAGNOSIS — I10 Essential (primary) hypertension: Secondary | ICD-10-CM

## 2015-01-27 DIAGNOSIS — D638 Anemia in other chronic diseases classified elsewhere: Secondary | ICD-10-CM | POA: Diagnosis not present

## 2015-01-27 DIAGNOSIS — D509 Iron deficiency anemia, unspecified: Secondary | ICD-10-CM

## 2015-01-27 LAB — CBC WITH DIFFERENTIAL/PLATELET
BASO%: 0.5 % (ref 0.0–2.0)
Basophils Absolute: 0 10*3/uL (ref 0.0–0.1)
EOS ABS: 0.1 10*3/uL (ref 0.0–0.5)
EOS%: 3.3 % (ref 0.0–7.0)
HCT: 34.4 % — ABNORMAL LOW (ref 34.8–46.6)
HGB: 11 g/dL — ABNORMAL LOW (ref 11.6–15.9)
LYMPH%: 42 % (ref 14.0–49.7)
MCH: 24.1 pg — ABNORMAL LOW (ref 25.1–34.0)
MCHC: 32.1 g/dL (ref 31.5–36.0)
MCV: 75.2 fL — ABNORMAL LOW (ref 79.5–101.0)
MONO#: 0.4 10*3/uL (ref 0.1–0.9)
MONO%: 8.6 % (ref 0.0–14.0)
NEUT%: 45.6 % (ref 38.4–76.8)
NEUTROS ABS: 2 10*3/uL (ref 1.5–6.5)
Platelets: 247 10*3/uL (ref 145–400)
RBC: 4.57 10*6/uL (ref 3.70–5.45)
RDW: 18.9 % — ABNORMAL HIGH (ref 11.2–14.5)
WBC: 4.5 10*3/uL (ref 3.9–10.3)
lymph#: 1.9 10*3/uL (ref 0.9–3.3)

## 2015-01-27 NOTE — Assessment & Plan Note (Signed)
I recommend she follows with primary care provider for blood pressure medication adjustment. 

## 2015-01-27 NOTE — Assessment & Plan Note (Signed)
Her platelet count remains stable. I will see her back again in 12 months with history, physical examination and blood work.

## 2015-01-27 NOTE — Progress Notes (Signed)
[facility] OFFICE PROGRESS NOTE  [name], MD SUMMARY OF HEMATOLOGIC HISTORY:  #1 2012, she has thrombocytopenia that resolved spontaneously. The patient also recalled taking prednisone in the past for possible diagnosis of lupus #2 04/12/2013 she had mild microcytic anemia #3 07/09/2013 she has persistent mild microcytic anemia and thrombocytopenia with a platelet count of 51,000 #4 08/02/2013 I saw the patient and recommended a bone marrow aspirate and biopsy #5 08/06/2013 I perform bone marrow aspirate and biopsy. The patient received 1 unit of platelets for severe thrombocytopenia with a platelet count of 5000 #6 08/11/13: she is started on prednisone 80 mg daily #7 08/19/13: platelet count improves to 222. We appreciated steroid taper #8 09/02/2013 her platelet count dropped to 90,000. We increased prednisone back to 60 mg a day #9 09/16/13: dose of prednisone was reduced back to 40 mg #10 09/30/13: prednisone dose was reduced to 20 mg #11 10/14/13: prednisone dose was reduced to 10 mg #12 10/21/13: Prednisone dose was reduced to 5 mg #13 11/01/13: Prednisone dose was reduced to 2.5 mg #14 11/15/2013: Prednisone dose was reduced to 2.5 mg every other day #15 12/27/2013, prednisone is discontinued INTERVAL HISTORY: [name] 74 y.o. female returns for further follow-up. She feels fine. She complained of mild fatigue. Her brother recently passed away and the family's grieving. The patient denies any recent signs or symptoms of bleeding such as spontaneous epistaxis, hematuria or hematochezia.  I have reviewed the past medical history, past surgical history, social history and family history with the patient and they are unchanged from previous note.  ALLERGIES:  is allergic to codeine; oxycodone; penicillins; sulfonamide derivatives; tramadol hcl; and triamcinolone.  MEDICATIONS:  Current Outpatient Prescriptions  Medication Sig Dispense Refill  .  acetaminophen (TYLENOL) 325 MG tablet Take 650 mg by mouth every 6 (six) hours as needed.      Marland Kitchen amLODipine (NORVASC) 5 MG tablet Take 5 mg by mouth daily.    Marland Kitchen aspirin 81 MG tablet Take 81 mg by mouth daily.      . cholecalciferol (VITAMIN D) 1000 UNITS tablet Take 1,000 Units by mouth daily.    . citalopram (CELEXA) 20 MG tablet Take 20 mg by mouth daily.    . ferrous sulfate 325 (65 FE) MG tablet Take 325 mg by mouth at bedtime.    . furosemide (LASIX) 20 MG tablet Take 20 mg by mouth daily.     Marland Kitchen HYDROcodone-homatropine (HYDROMET) 5-1.5 MG/5ML syrup Take 5 mLs by mouth every 6 (six) hours as needed. 240 mL 0  . hyoscyamine (LEVSIN SL) 0.125 MG SL tablet Place 1 tablet (0.125 mg total) under the tongue every 4 (four) hours as needed for cramping. 30 tablet 2  . KLOR-CON M20 20 MEQ tablet TAKE 1 TABLET (20 MEQ TOTAL) BY MOUTH 2 (TWO) TIMES DAILY. 60 tablet 1  . levothyroxine (SYNTHROID, LEVOTHROID) 100 MCG tablet Take 100 mcg by mouth daily.      Marland Kitchen loratadine (CLARITIN) 10 MG tablet TAKE 1 TABLET (10 MG TOTAL) BY MOUTH DAILY. 30 tablet 11  . losartan (COZAAR) 100 MG tablet Take 100 mg by mouth daily.    Marland Kitchen lovastatin (MEVACOR) 40 MG tablet Take 40 mg by mouth at bedtime.      . pantoprazole (PROTONIX) 40 MG tablet TAKE 1 TABLET (40 MG TOTAL) BY MOUTH DAILY. 90 tablet 3  . potassium chloride SA (K-DUR,KLOR-CON) 20 MEQ tablet Take 1 tablet (20 mEq total) by mouth 2 (two) times daily.  90 tablet 0  . senna (SENOKOT) 8.6 MG tablet Take 1 tablet by mouth as needed.      No current facility-administered medications for this visit.     REVIEW OF SYSTEMS:   Constitutional: Denies fevers, chills or night sweats Eyes: Denies blurriness of vision Ears, nose, mouth, throat, and face: Denies mucositis or sore throat Respiratory: Denies cough, dyspnea or wheezes Cardiovascular: Denies palpitation, chest discomfort or lower extremity swelling Gastrointestinal:  Denies nausea, heartburn or change in bowel  habits Skin: Denies abnormal skin rashes Lymphatics: Denies new lymphadenopathy or easy bruising Neurological:Denies numbness, tingling or new weaknesses Behavioral/Psych: Mood is stable, no new changes  All other systems were reviewed with the patient and are negative.  PHYSICAL EXAMINATION: ECOG PERFORMANCE STATUS: 0 - Asymptomatic  Filed Vitals:   01/27/15 1030  BP: 166/81  Pulse: 67  Temp: 98 F (36.7 C)  Resp: 18   Filed Weights   01/27/15 1030  Weight: 194 lb 6.4 oz (88.179 kg)    GENERAL:alert, no distress and comfortable SKIN: skin color, texture, turgor are normal, no rashes or significant lesions EYES: normal, Conjunctiva are pink and non-injected, sclera clear Musculoskeletal:no cyanosis of digits and no clubbing  NEURO: alert & oriented x 3 with fluent speech, no focal motor/sensory deficits  LABORATORY DATA:  I have reviewed the data as listed Results for orders placed or performed in visit on 01/27/15 (from the past 48 hour(s))  CBC with Differential     Status: Abnormal   Collection Time: 01/27/15 10:09 AM  Result Value Ref Range   WBC 4.5 3.9 - 10.3 10e3/uL   NEUT# 2.0 1.5 - 6.5 10e3/uL   HGB 11.0 (L) 11.6 - 15.9 g/dL   HCT 60.434.4 (L) 54.034.8 - 98.146.6 %   Platelets 247 145 - 400 10e3/uL   MCV 75.2 (L) 79.5 - 101.0 fL   MCH 24.1 (L) 25.1 - 34.0 pg   MCHC 32.1 31.5 - 36.0 g/dL   RBC 1.914.57 4.783.70 - 2.955.45 10e6/uL   RDW 18.9 (H) 11.2 - 14.5 %   lymph# 1.9 0.9 - 3.3 10e3/uL   MONO# 0.4 0.1 - 0.9 10e3/uL   Eosinophils Absolute 0.1 0.0 - 0.5 10e3/uL   Basophils Absolute 0.0 0.0 - 0.1 10e3/uL   NEUT% 45.6 38.4 - 76.8 %   LYMPH% 42.0 14.0 - 49.7 %   MONO% 8.6 0.0 - 14.0 %   EOS% 3.3 0.0 - 7.0 %   BASO% 0.5 0.0 - 2.0 %    Lab Results  Component Value Date   WBC 4.5 01/27/2015   HGB 11.0* 01/27/2015   HCT 34.4* 01/27/2015   MCV 75.2* 01/27/2015   PLT 247 01/27/2015  ASSESSMENT & PLAN:  ITP (idiopathic thrombocytopenic purpura) Her platelet count remains  stable. I will see her back again in 12 months with history, physical examination and blood work.     Anemia in chronic illness This could be due to anemia chronic disease. I will recheck iron studies at the next visit. Clinically, she is not symptomatic.   Essential hypertension I recommend she follows with primary care provider for blood pressure medication adjustment.     All questions were answered. The patient knows to call the clinic with any problems, questions or concerns. No barriers to learning was detected.  I spent 15 minutes counseling the patient face to face. The total time spent in the appointment was 20 minutes and more than 50% was on counseling.     Keirston Saephanh,  Cassandr Cederberg, MD 3/18/20164:04 PM

## 2015-01-27 NOTE — Assessment & Plan Note (Signed)
This could be due to anemia chronic disease. I will recheck iron studies at the next visit. Clinically, she is not symptomatic.

## 2015-01-27 NOTE — Telephone Encounter (Signed)
Gave avs & calendar for March 2017. °

## 2015-02-10 ENCOUNTER — Telehealth: Payer: Self-pay | Admitting: *Deleted

## 2015-02-10 NOTE — Telephone Encounter (Signed)
error 

## 2015-05-09 DIAGNOSIS — D631 Anemia in chronic kidney disease: Secondary | ICD-10-CM | POA: Diagnosis not present

## 2015-05-09 DIAGNOSIS — N183 Chronic kidney disease, stage 3 (moderate): Secondary | ICD-10-CM | POA: Diagnosis not present

## 2015-05-09 DIAGNOSIS — N2581 Secondary hyperparathyroidism of renal origin: Secondary | ICD-10-CM | POA: Diagnosis not present

## 2015-05-09 DIAGNOSIS — N189 Chronic kidney disease, unspecified: Secondary | ICD-10-CM | POA: Diagnosis not present

## 2015-05-09 DIAGNOSIS — E785 Hyperlipidemia, unspecified: Secondary | ICD-10-CM | POA: Diagnosis not present

## 2015-05-18 DIAGNOSIS — N183 Chronic kidney disease, stage 3 (moderate): Secondary | ICD-10-CM | POA: Diagnosis not present

## 2015-05-20 ENCOUNTER — Emergency Department (HOSPITAL_COMMUNITY)
Admission: EM | Admit: 2015-05-20 | Discharge: 2015-05-20 | Disposition: A | Discharge status: Home / Self Care | Payer: Medicare Other | Attending: Emergency Medicine | Admitting: Emergency Medicine

## 2015-05-20 ENCOUNTER — Emergency Department (HOSPITAL_COMMUNITY): Payer: Medicare Other

## 2015-05-20 ENCOUNTER — Encounter (HOSPITAL_COMMUNITY): Payer: Self-pay | Admitting: *Deleted

## 2015-05-20 DIAGNOSIS — Z87891 Personal history of nicotine dependence: Secondary | ICD-10-CM | POA: Insufficient documentation

## 2015-05-20 DIAGNOSIS — F419 Anxiety disorder, unspecified: Secondary | ICD-10-CM | POA: Insufficient documentation

## 2015-05-20 DIAGNOSIS — Z7982 Long term (current) use of aspirin: Secondary | ICD-10-CM | POA: Insufficient documentation

## 2015-05-20 DIAGNOSIS — Z8619 Personal history of other infectious and parasitic diseases: Secondary | ICD-10-CM | POA: Insufficient documentation

## 2015-05-20 DIAGNOSIS — E0500 Thyrotoxicosis with diffuse goiter without thyrotoxic crisis or storm: Secondary | ICD-10-CM | POA: Diagnosis not present

## 2015-05-20 DIAGNOSIS — I10 Essential (primary) hypertension: Secondary | ICD-10-CM | POA: Insufficient documentation

## 2015-05-20 DIAGNOSIS — R011 Cardiac murmur, unspecified: Secondary | ICD-10-CM | POA: Diagnosis not present

## 2015-05-20 DIAGNOSIS — E785 Hyperlipidemia, unspecified: Secondary | ICD-10-CM | POA: Diagnosis not present

## 2015-05-20 DIAGNOSIS — Z87440 Personal history of urinary (tract) infections: Secondary | ICD-10-CM | POA: Diagnosis not present

## 2015-05-20 DIAGNOSIS — E860 Dehydration: Secondary | ICD-10-CM | POA: Insufficient documentation

## 2015-05-20 DIAGNOSIS — Z8709 Personal history of other diseases of the respiratory system: Secondary | ICD-10-CM | POA: Diagnosis not present

## 2015-05-20 DIAGNOSIS — R5383 Other fatigue: Secondary | ICD-10-CM | POA: Diagnosis present

## 2015-05-20 DIAGNOSIS — Z79899 Other long term (current) drug therapy: Secondary | ICD-10-CM | POA: Diagnosis not present

## 2015-05-20 DIAGNOSIS — R079 Chest pain, unspecified: Secondary | ICD-10-CM | POA: Diagnosis not present

## 2015-05-20 DIAGNOSIS — Z880 Allergy status to penicillin: Secondary | ICD-10-CM | POA: Diagnosis not present

## 2015-05-20 DIAGNOSIS — Z86010 Personal history of colonic polyps: Secondary | ICD-10-CM | POA: Insufficient documentation

## 2015-05-20 DIAGNOSIS — R110 Nausea: Secondary | ICD-10-CM

## 2015-05-20 DIAGNOSIS — Z862 Personal history of diseases of the blood and blood-forming organs and certain disorders involving the immune mechanism: Secondary | ICD-10-CM | POA: Diagnosis not present

## 2015-05-20 DIAGNOSIS — M1990 Unspecified osteoarthritis, unspecified site: Secondary | ICD-10-CM | POA: Insufficient documentation

## 2015-05-20 DIAGNOSIS — M6281 Muscle weakness (generalized): Secondary | ICD-10-CM | POA: Diagnosis not present

## 2015-05-20 DIAGNOSIS — R531 Weakness: Secondary | ICD-10-CM

## 2015-05-20 DIAGNOSIS — K219 Gastro-esophageal reflux disease without esophagitis: Secondary | ICD-10-CM | POA: Diagnosis not present

## 2015-05-20 LAB — CBC WITH DIFFERENTIAL/PLATELET
BASOS ABS: 0 10*3/uL (ref 0.0–0.1)
Basophils Relative: 1 % (ref 0–1)
Eosinophils Absolute: 0.2 10*3/uL (ref 0.0–0.7)
Eosinophils Relative: 3 % (ref 0–5)
HCT: 37.6 % (ref 36.0–46.0)
Hemoglobin: 12.7 g/dL (ref 12.0–15.0)
LYMPHS PCT: 38 % (ref 12–46)
Lymphs Abs: 2.1 10*3/uL (ref 0.7–4.0)
MCH: 25.7 pg — ABNORMAL LOW (ref 26.0–34.0)
MCHC: 33.8 g/dL (ref 30.0–36.0)
MCV: 76.1 fL — AB (ref 78.0–100.0)
Monocytes Absolute: 0.4 10*3/uL (ref 0.1–1.0)
Monocytes Relative: 7 % (ref 3–12)
NEUTROS ABS: 2.8 10*3/uL (ref 1.7–7.7)
NEUTROS PCT: 52 % (ref 43–77)
PLATELETS: 145 10*3/uL — AB (ref 150–400)
RBC: 4.94 MIL/uL (ref 3.87–5.11)
RDW: 18.1 % — ABNORMAL HIGH (ref 11.5–15.5)
WBC: 5.5 10*3/uL (ref 4.0–10.5)

## 2015-05-20 LAB — BASIC METABOLIC PANEL
ANION GAP: 8 (ref 5–15)
BUN: 20 mg/dL (ref 6–20)
CO2: 24 mmol/L (ref 22–32)
CREATININE: 1.61 mg/dL — AB (ref 0.44–1.00)
Calcium: 10.5 mg/dL — ABNORMAL HIGH (ref 8.9–10.3)
Chloride: 103 mmol/L (ref 101–111)
GFR calc Af Amer: 35 mL/min — ABNORMAL LOW (ref >60)
GFR calc non Af Amer: 30 mL/min — ABNORMAL LOW (ref >60)
GLUCOSE: 111 mg/dL — AB (ref 65–99)
Potassium: 4.2 mmol/L (ref 3.5–5.1)
Sodium: 135 mmol/L (ref 135–145)

## 2015-05-20 LAB — TROPONIN I: Troponin I: <0.03 ng/mL (ref <0.031)

## 2015-05-20 MED ORDER — ONDANSETRON HCL 4 MG PO TABS: 4.0000 mg | ORAL_TABLET | Freq: Four times a day (QID) | ORAL | Status: DC

## 2015-05-20 MED ORDER — SODIUM CHLORIDE 0.9 % IV BOLUS (SEPSIS)
500.0000 mL | Freq: Once | INTRAVENOUS | Status: AC
  Administered 2015-05-20: 500 mL via INTRAVENOUS

## 2015-05-20 MED ORDER — ONDANSETRON HCL 4 MG/2ML IJ SOLN
4.0000 mg | Freq: Once | INTRAMUSCULAR | Status: AC
  Administered 2015-05-20: 4 mg via INTRAVENOUS
  Filled 2015-05-20: qty 2

## 2015-05-20 NOTE — ED Notes (Signed)
Patient is resting comfortably with eyes closed; VSS.

## 2015-05-20 NOTE — Discharge Instructions (Signed)

## 2015-05-20 NOTE — ED Provider Notes (Signed)
CSN: [id number]     Arrival date & time 05/20/15  1806 History   First MD Initiated Contact with Patient 05/20/15 2006     Chief Complaint  Patient presents with  . Fatigue     (Consider location/radiation/quality/duration/timing/severity/associated sxs/prior Treatment) HPI Comments: Patient has been sick for 1 week. She reports that she had onset of nausea, vomiting and diarrhea earlier in the week. The diarrhea has stopped and she has not vomited in the last several days because she hasn't been able to eat or keep anything in her stomach. She still feels nauseated. She did have chest pain earlier during the week, but is not expressing any pain currently. Pain was present only with movement. There is no shortness of breath. She has not had any fever. Patient denies abdominal pain.   Past Medical History  Diagnosis Date  . Hypertension   . History of Graves' disease   . Hypercalcemia   . Allergic rhinitis   . Murmur, heart   . Back pain   . Renal insufficiency   . Dyslipidemia   . Osteoarthritis, knee   . Bronchiectasis   . Thoracic aortic aneurysm   . Hx of colonic polyp   . Thrombocytopenia, unspecified 08/02/2013  . Fatigue 08/02/2013  . 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 Thrush, oral 09/02/2013  . Hyperlipidemia   . GERD (gastroesophageal reflux disease)   . Anxiety   . Dysuria 10/14/2013  . Thrush 10/14/2013  . UTI (lower urinary tract infection) 10/14/2013  . Hypokalemia 11/15/2013  . Thyroid disease    Past Surgical History  Procedure Laterality Date  . Abdominal hysterectomy      b/c fibroids  . Knee arthroplasty Right 10/2007  . Total knee arthroplasty Bilateral   . Appendectomy     Family History  Problem Relation Age of Onset  . Kidney cancer Brother 2060  . Prostate cancer Brother   . Colon cancer Brother   . Breast cancer Maternal Aunt   . Liver cancer Maternal Uncle   . Colon polyps Brother   . Heart disease Mother   . Irritable bowel  syndrome Maternal Aunt   . Kidney disease Mother     on dialysis   History  Substance Use Topics  . Smoking status: Former Smoker -- 1.00 packs/day for 10 years    Types: Cigarettes    Quit date: 11/11/1976  . Smokeless tobacco: Never Used  . Alcohol Use: No   OB History    No data available     Review of Systems  Constitutional: Positive for fatigue.  Gastrointestinal: Positive for nausea, vomiting and diarrhea.  Neurological: Positive for dizziness.  All other systems reviewed and are negative.     Allergies  Codeine; Fish allergy; Oxycodone; Penicillins; Sulfonamide derivatives; Tramadol hcl; and Triamcinolone  Home Medications   Prior to Admission medications   Medication Sig Start Date End Date Taking? Authorizing Provider  acetaminophen (TYLENOL) 325 MG tablet Take 650 mg by mouth every 6 (six) hours as needed.      Historical Provider, MD  amLODipine (NORVASC) 5 MG tablet Take 5 mg by mouth daily.    Historical Provider, MD  aspirin 81 MG tablet Take 81 mg by mouth daily.      Historical Provider, MD  cholecalciferol (VITAMIN D) 1000 UNITS tablet Take 1,000 Units by mouth daily.    Historical Provider, MD  citalopram (CELEXA) 20 MG tablet Take 20 mg by mouth daily.    Historical Provider, MD  ferrous sulfate 325 (65 FE) MG tablet Take 325 mg by mouth at bedtime.    Historical Provider, MD  furosemide (LASIX) 20 MG tablet Take 20 mg by mouth daily.  07/16/13   Historical Provider, MD  HYDROcodone-homatropine (HYDROMET) 5-1.5 MG/5ML syrup Take 5 mLs by mouth every 6 (six) hours as needed. 01/20/15   Tammy S Parrett, NP  hyoscyamine (LEVSIN SL) 0.125 MG SL tablet Place 1 tablet (0.125 mg total) under the tongue every 4 (four) hours as needed for cramping. 09/13/13   Louis Meckel, MD  KLOR-CON M20 20 MEQ tablet TAKE 1 TABLET (20 MEQ TOTAL) BY MOUTH 2 (TWO) TIMES DAILY. 06/26/14   Artis Delay, MD  levothyroxine (SYNTHROID, LEVOTHROID) 100 MCG tablet Take 100 mcg by mouth  daily.      Historical Provider, MD  loratadine (CLARITIN) 10 MG tablet TAKE 1 TABLET (10 MG TOTAL) BY MOUTH DAILY. 10/24/14   Leslye Peer, MD  losartan (COZAAR) 100 MG tablet Take 100 mg by mouth daily.    Historical Provider, MD  lovastatin (MEVACOR) 40 MG tablet Take 40 mg by mouth at bedtime.      Historical Provider, MD  pantoprazole (PROTONIX) 40 MG tablet TAKE 1 TABLET (40 MG TOTAL) BY MOUTH DAILY. 09/08/14   Louis Meckel, MD  potassium chloride SA (K-DUR,KLOR-CON) 20 MEQ tablet Take 1 tablet (20 mEq total) by mouth 2 (two) times daily. 06/28/14   Artis Delay, MD  senna (SENOKOT) 8.6 MG tablet Take 1 tablet by mouth as needed.     Historical Provider, MD   BP 130/87 mmHg  Pulse 99  Temp(Src) 98.8 F (37.1 C) (Oral)  Resp 20  Ht 5' 5.5" (1.664 m)  Wt 200 lb (90.719 kg)  BMI 32.76 kg/m2  SpO2 97% Physical Exam  Constitutional: She is oriented to person, place, and time. She appears well-developed and well-nourished. No distress.  HENT:  Head: Normocephalic and atraumatic.  Right Ear: Hearing normal.  Left Ear: Hearing normal.  Nose: Nose normal.  Mouth/Throat: Oropharynx is clear and moist and mucous membranes are normal.  Eyes: Conjunctivae and EOM are normal. Pupils are equal, round, and reactive to light.  Neck: Normal range of motion. Neck supple.  Cardiovascular: Regular rhythm, S1 normal and S2 normal.  Exam reveals no gallop and no friction rub.   No murmur heard. Pulmonary/Chest: Effort normal and breath sounds normal. No respiratory distress. She exhibits no tenderness.  Abdominal: Soft. Normal appearance and bowel sounds are normal. There is no hepatosplenomegaly. There is no tenderness. There is no rebound, no guarding, no tenderness at McBurney's point and negative Murphy's sign. No hernia.  Musculoskeletal: Normal range of motion.  Neurological: She is alert and oriented to person, place, and time. She has normal strength. No cranial nerve deficit or sensory  deficit. Coordination normal. GCS eye subscore is 4. GCS verbal subscore is 5. GCS motor subscore is 6.  Skin: Skin is warm, dry and intact. No rash noted. No cyanosis.  Psychiatric: She has a normal mood and affect. Her speech is normal and behavior is normal. Thought content normal.  Nursing note and vitals reviewed.   ED Course  Procedures (including critical care time) Labs Review Labs Reviewed  BASIC METABOLIC PANEL - Abnormal; Notable for the following:    Glucose, Bld 111 (*)    Creatinine, Ser 1.61 (*)    Calcium 10.5 (*)    GFR calc non Af Amer 30 (*)    GFR calc Af  Amer 35 (*)    All other components within normal limits  CBC WITH DIFFERENTIAL/PLATELET - Abnormal; Notable for the following:    MCV 76.1 (*)    MCH 25.7 (*)    RDW 18.1 (*)    Platelets 145 (*)    All other components within normal limits  TROPONIN I    Imaging Review Dg Chest 2 View  05/20/2015   CLINICAL DATA:  Weakness, dizziness and chest pain for 3 months  EXAM: CHEST  2 VIEW  COMPARISON:  01/20/2015  FINDINGS: The cardiac silhouette, mediastinal and hilar contours are within normal limits and stable. There is tortuosity and calcification of the thoracic aorta. The lungs are clear. No pleural effusion. Advanced degenerative changes involving both shoulders suspicious for inflammatory arthropathy.  IMPRESSION: No acute cardiopulmonary findings.   Electronically Signed   By: Rudie Meyer M.D.   On: 05/20/2015 19:29     EKG Interpretation   Date/Time:  Saturday May 20 2015 18:14:45 EDT Ventricular Rate:  120 PR Interval:  140 QRS Duration: 76 QT Interval:  318 QTC Calculation: 449 R Axis:   36 Text Interpretation:  Sinus tachycardia Otherwise normal ECG Confirmed by  Shavell Nored  MD, Antwian Santaana [phone]) on 05/20/2015 8:06:55 PM      MDM   Final diagnoses:  None   nausea  Dehydration  She presents to the ER with complaints of weakness and dizziness. Symptoms have been ongoing for some time and  she has seen her doctor for this. Her workup has been unremarkable. This included EKG, cardiac workup, blood work, x-ray of chest. No abnormality is seen. Patient only had some elevation of creatinine, possibly from dehydration. She was given Zofran and IV fluids and is feeling much better. Mild tachycardia present at arrival has resolved.  Has not been any chest pain here in the ER. Patient is felt to be appropriate for discharge, symptomatically treatment for her nausea and follow-up with PCP.    Gilda Crease, MD 05/20/15 2245

## 2015-05-20 NOTE — ED Notes (Addendum)
[name] ProvostLindsay Gould (nephew)-- [phone] Cell; [phone] Home

## 2015-05-20 NOTE — ED Notes (Signed)
Pollina, MD at bedside to discuss plan of care.

## 2015-05-20 NOTE — ED Notes (Signed)
The pt has been back and forth to her doctors all weak with dizziness and nausea that started Monday.  She is c/o weakness  And some chest pain when she stsnds.  Pain in the lt side of her body.  She has had blood drawn x 2

## 2015-06-02 ENCOUNTER — Encounter: Payer: Self-pay | Admitting: Gastroenterology

## 2015-06-07 ENCOUNTER — Encounter: Payer: Self-pay | Admitting: Cardiovascular Disease

## 2015-08-01 DIAGNOSIS — M5136 Other intervertebral disc degeneration, lumbar region: Secondary | ICD-10-CM | POA: Diagnosis not present

## 2015-08-01 DIAGNOSIS — E039 Hypothyroidism, unspecified: Secondary | ICD-10-CM | POA: Diagnosis not present

## 2015-08-01 DIAGNOSIS — E559 Vitamin D deficiency, unspecified: Secondary | ICD-10-CM | POA: Diagnosis not present

## 2015-08-01 DIAGNOSIS — M810 Age-related osteoporosis without current pathological fracture: Secondary | ICD-10-CM | POA: Diagnosis not present

## 2015-08-01 DIAGNOSIS — M545 Low back pain: Secondary | ICD-10-CM | POA: Diagnosis not present

## 2015-08-01 DIAGNOSIS — I10 Essential (primary) hypertension: Secondary | ICD-10-CM | POA: Diagnosis not present

## 2015-08-29 DIAGNOSIS — G894 Chronic pain syndrome: Secondary | ICD-10-CM | POA: Diagnosis not present

## 2015-08-29 DIAGNOSIS — M5137 Other intervertebral disc degeneration, lumbosacral region: Secondary | ICD-10-CM | POA: Diagnosis not present

## 2015-08-29 DIAGNOSIS — E669 Obesity, unspecified: Secondary | ICD-10-CM | POA: Diagnosis not present

## 2015-08-29 DIAGNOSIS — M4696 Unspecified inflammatory spondylopathy, lumbar region: Secondary | ICD-10-CM | POA: Diagnosis not present

## 2015-08-29 DIAGNOSIS — Z79899 Other long term (current) drug therapy: Secondary | ICD-10-CM | POA: Diagnosis not present

## 2015-08-29 DIAGNOSIS — M47817 Spondylosis without myelopathy or radiculopathy, lumbosacral region: Secondary | ICD-10-CM | POA: Diagnosis not present

## 2015-09-15 ENCOUNTER — Other Ambulatory Visit: Payer: Self-pay | Admitting: Gastroenterology

## 2015-09-20 DIAGNOSIS — Z79899 Other long term (current) drug therapy: Secondary | ICD-10-CM | POA: Diagnosis not present

## 2015-09-20 DIAGNOSIS — M47817 Spondylosis without myelopathy or radiculopathy, lumbosacral region: Secondary | ICD-10-CM | POA: Diagnosis not present

## 2015-09-20 DIAGNOSIS — G894 Chronic pain syndrome: Secondary | ICD-10-CM | POA: Diagnosis not present

## 2015-09-26 DIAGNOSIS — S6991XA Unspecified injury of right wrist, hand and finger(s), initial encounter: Secondary | ICD-10-CM | POA: Diagnosis not present

## 2015-09-26 DIAGNOSIS — M4696 Unspecified inflammatory spondylopathy, lumbar region: Secondary | ICD-10-CM | POA: Diagnosis not present

## 2015-09-26 DIAGNOSIS — E039 Hypothyroidism, unspecified: Secondary | ICD-10-CM | POA: Diagnosis not present

## 2015-09-26 DIAGNOSIS — M5137 Other intervertebral disc degeneration, lumbosacral region: Secondary | ICD-10-CM | POA: Diagnosis not present

## 2015-09-26 DIAGNOSIS — M47817 Spondylosis without myelopathy or radiculopathy, lumbosacral region: Secondary | ICD-10-CM | POA: Diagnosis not present

## 2015-09-26 DIAGNOSIS — G894 Chronic pain syndrome: Secondary | ICD-10-CM | POA: Diagnosis not present

## 2015-09-26 DIAGNOSIS — E559 Vitamin D deficiency, unspecified: Secondary | ICD-10-CM | POA: Diagnosis not present

## 2015-09-26 DIAGNOSIS — M79641 Pain in right hand: Secondary | ICD-10-CM | POA: Diagnosis not present

## 2015-09-26 DIAGNOSIS — I10 Essential (primary) hypertension: Secondary | ICD-10-CM | POA: Diagnosis not present

## 2015-09-26 DIAGNOSIS — M810 Age-related osteoporosis without current pathological fracture: Secondary | ICD-10-CM | POA: Diagnosis not present

## 2015-09-26 DIAGNOSIS — Z79899 Other long term (current) drug therapy: Secondary | ICD-10-CM | POA: Diagnosis not present

## 2015-09-26 DIAGNOSIS — N390 Urinary tract infection, site not specified: Secondary | ICD-10-CM | POA: Diagnosis not present

## 2015-09-26 DIAGNOSIS — E669 Obesity, unspecified: Secondary | ICD-10-CM | POA: Diagnosis not present

## 2015-09-28 DIAGNOSIS — M79609 Pain in unspecified limb: Secondary | ICD-10-CM | POA: Diagnosis not present

## 2015-09-28 DIAGNOSIS — M545 Low back pain: Secondary | ICD-10-CM | POA: Diagnosis not present

## 2015-10-11 DIAGNOSIS — M25511 Pain in right shoulder: Secondary | ICD-10-CM | POA: Diagnosis not present

## 2015-10-11 DIAGNOSIS — M79641 Pain in right hand: Secondary | ICD-10-CM | POA: Diagnosis not present

## 2015-10-11 DIAGNOSIS — M19011 Primary osteoarthritis, right shoulder: Secondary | ICD-10-CM | POA: Diagnosis not present

## 2015-10-11 DIAGNOSIS — M79642 Pain in left hand: Secondary | ICD-10-CM | POA: Diagnosis not present

## 2015-10-24 DIAGNOSIS — M47817 Spondylosis without myelopathy or radiculopathy, lumbosacral region: Secondary | ICD-10-CM | POA: Diagnosis not present

## 2015-10-24 DIAGNOSIS — Z79899 Other long term (current) drug therapy: Secondary | ICD-10-CM | POA: Diagnosis not present

## 2015-10-24 DIAGNOSIS — G894 Chronic pain syndrome: Secondary | ICD-10-CM | POA: Diagnosis not present

## 2015-11-07 DIAGNOSIS — M47817 Spondylosis without myelopathy or radiculopathy, lumbosacral region: Secondary | ICD-10-CM | POA: Diagnosis not present

## 2015-11-21 DIAGNOSIS — E669 Obesity, unspecified: Secondary | ICD-10-CM | POA: Diagnosis not present

## 2015-11-21 DIAGNOSIS — Z79899 Other long term (current) drug therapy: Secondary | ICD-10-CM | POA: Diagnosis not present

## 2015-11-21 DIAGNOSIS — G894 Chronic pain syndrome: Secondary | ICD-10-CM | POA: Diagnosis not present

## 2015-11-21 DIAGNOSIS — K5909 Other constipation: Secondary | ICD-10-CM | POA: Diagnosis not present

## 2015-11-21 DIAGNOSIS — M47817 Spondylosis without myelopathy or radiculopathy, lumbosacral region: Secondary | ICD-10-CM | POA: Diagnosis not present

## 2015-11-21 DIAGNOSIS — M5137 Other intervertebral disc degeneration, lumbosacral region: Secondary | ICD-10-CM | POA: Diagnosis not present

## 2015-12-09 ENCOUNTER — Emergency Department (HOSPITAL_COMMUNITY): Payer: Medicare Other

## 2015-12-09 ENCOUNTER — Emergency Department (HOSPITAL_COMMUNITY)
Admission: EM | Admit: 2015-12-09 | Discharge: 2015-12-09 | Disposition: A | Discharge status: Home / Self Care | Payer: Medicare Other | Attending: Emergency Medicine | Admitting: Emergency Medicine

## 2015-12-09 ENCOUNTER — Encounter (HOSPITAL_COMMUNITY): Payer: Self-pay | Admitting: Emergency Medicine

## 2015-12-09 DIAGNOSIS — J209 Acute bronchitis, unspecified: Secondary | ICD-10-CM | POA: Diagnosis not present

## 2015-12-09 DIAGNOSIS — E876 Hypokalemia: Secondary | ICD-10-CM | POA: Diagnosis not present

## 2015-12-09 DIAGNOSIS — Z79899 Other long term (current) drug therapy: Secondary | ICD-10-CM | POA: Diagnosis not present

## 2015-12-09 DIAGNOSIS — I10 Essential (primary) hypertension: Secondary | ICD-10-CM | POA: Insufficient documentation

## 2015-12-09 DIAGNOSIS — R011 Cardiac murmur, unspecified: Secondary | ICD-10-CM | POA: Diagnosis not present

## 2015-12-09 DIAGNOSIS — M179 Osteoarthritis of knee, unspecified: Secondary | ICD-10-CM | POA: Insufficient documentation

## 2015-12-09 DIAGNOSIS — E785 Hyperlipidemia, unspecified: Secondary | ICD-10-CM | POA: Diagnosis not present

## 2015-12-09 DIAGNOSIS — Z8619 Personal history of other infectious and parasitic diseases: Secondary | ICD-10-CM | POA: Insufficient documentation

## 2015-12-09 DIAGNOSIS — F419 Anxiety disorder, unspecified: Secondary | ICD-10-CM | POA: Insufficient documentation

## 2015-12-09 DIAGNOSIS — Z87440 Personal history of urinary (tract) infections: Secondary | ICD-10-CM | POA: Insufficient documentation

## 2015-12-09 DIAGNOSIS — Z862 Personal history of diseases of the blood and blood-forming organs and certain disorders involving the immune mechanism: Secondary | ICD-10-CM | POA: Insufficient documentation

## 2015-12-09 DIAGNOSIS — K219 Gastro-esophageal reflux disease without esophagitis: Secondary | ICD-10-CM | POA: Insufficient documentation

## 2015-12-09 DIAGNOSIS — R05 Cough: Secondary | ICD-10-CM | POA: Diagnosis not present

## 2015-12-09 DIAGNOSIS — E079 Disorder of thyroid, unspecified: Secondary | ICD-10-CM | POA: Diagnosis not present

## 2015-12-09 DIAGNOSIS — Z86010 Personal history of colonic polyps: Secondary | ICD-10-CM | POA: Insufficient documentation

## 2015-12-09 DIAGNOSIS — Z880 Allergy status to penicillin: Secondary | ICD-10-CM | POA: Insufficient documentation

## 2015-12-09 DIAGNOSIS — Z87448 Personal history of other diseases of urinary system: Secondary | ICD-10-CM | POA: Insufficient documentation

## 2015-12-09 DIAGNOSIS — Z87891 Personal history of nicotine dependence: Secondary | ICD-10-CM | POA: Insufficient documentation

## 2015-12-09 DIAGNOSIS — Z7982 Long term (current) use of aspirin: Secondary | ICD-10-CM | POA: Insufficient documentation

## 2015-12-09 LAB — BASIC METABOLIC PANEL
Anion gap: 11 (ref 5–15)
BUN: 11 mg/dL (ref 6–20)
CALCIUM: 10.1 mg/dL (ref 8.9–10.3)
CO2: 24 mmol/L (ref 22–32)
CREATININE: 1.2 mg/dL — AB (ref 0.44–1.00)
Chloride: 104 mmol/L (ref 101–111)
GFR calc Af Amer: 50 mL/min — ABNORMAL LOW (ref >60)
GFR calc non Af Amer: 43 mL/min — ABNORMAL LOW (ref >60)
GLUCOSE: 128 mg/dL — AB (ref 65–99)
Potassium: 3.1 mmol/L — ABNORMAL LOW (ref 3.5–5.1)
Sodium: 139 mmol/L (ref 135–145)

## 2015-12-09 LAB — CBC WITH DIFFERENTIAL/PLATELET
BASOS PCT: 0 %
Basophils Absolute: 0 10*3/uL (ref 0.0–0.1)
EOS ABS: 0 10*3/uL (ref 0.0–0.7)
EOS PCT: 0 %
HCT: 34.3 % — ABNORMAL LOW (ref 36.0–46.0)
Hemoglobin: 11.4 g/dL — ABNORMAL LOW (ref 12.0–15.0)
Lymphocytes Relative: 11 %
Lymphs Abs: 1 10*3/uL (ref 0.7–4.0)
MCH: 24.8 pg — ABNORMAL LOW (ref 26.0–34.0)
MCHC: 33.2 g/dL (ref 30.0–36.0)
MCV: 74.7 fL — ABNORMAL LOW (ref 78.0–100.0)
MONOS PCT: 4 %
Monocytes Absolute: 0.4 10*3/uL (ref 0.1–1.0)
Neutro Abs: 8.2 10*3/uL — ABNORMAL HIGH (ref 1.7–7.7)
Neutrophils Relative %: 85 %
Platelets: 57 10*3/uL — ABNORMAL LOW (ref 150–400)
RBC: 4.59 MIL/uL (ref 3.87–5.11)
RDW: 18.4 % — ABNORMAL HIGH (ref 11.5–15.5)
WBC: 9.7 10*3/uL (ref 4.0–10.5)

## 2015-12-09 MED ORDER — AZITHROMYCIN 250 MG PO TABS: 250.0000 mg | ORAL_TABLET | Freq: Every day | ORAL | Status: DC

## 2015-12-09 MED ORDER — ACETAMINOPHEN 325 MG PO TABS
650.0000 mg | ORAL_TABLET | Freq: Once | ORAL | Status: AC
  Administered 2015-12-09: 650 mg via ORAL
  Filled 2015-12-09: qty 2

## 2015-12-09 MED ORDER — ACETAMINOPHEN 500 MG PO TABS: 500.0000 mg | ORAL_TABLET | Freq: Four times a day (QID) | ORAL | Status: DC | PRN

## 2015-12-09 MED ORDER — BENZONATATE 100 MG PO CAPS: 100.0000 mg | ORAL_CAPSULE | Freq: Three times a day (TID) | ORAL | Status: DC | PRN

## 2015-12-09 NOTE — ED Notes (Signed)
Pt here from home with c/o chest pain and cough that has been on and off for the past week , pt states the cough is productive

## 2015-12-09 NOTE — ED Provider Notes (Signed)
Medical screening examination/treatment/procedure(s) were performed by non-physician practitioner and as supervising physician I was immediately available for consultation/collaboration.   EKG Interpretation   Date/Time:  Saturday December 09 2015 08:27:26 EST Ventricular Rate:  90 PR Interval:  172 QRS Duration: 79 QT Interval:  384 QTC Calculation: 470 R Axis:   -5 Text Interpretation:  Sinus tachycardia Multiple ventricular premature  complexes Probable left atrial enlargement Borderline repolarization  abnormality Confirmed by Chia Mowers  MD, Chantelle Verdi [phone]) on 12/09/2015 8:33:53  AM      Results for orders placed or performed during the hospital encounter of 12/09/15  Basic metabolic panel  Result Value Ref Range   Sodium 139 135 - 145 mmol/L   Potassium 3.1 (L) 3.5 - 5.1 mmol/L   Chloride 104 101 - 111 mmol/L   CO2 24 22 - 32 mmol/L   Glucose, Bld 128 (H) 65 - 99 mg/dL   BUN 11 6 - 20 mg/dL   Creatinine, Ser 1.91 (H) 0.44 - 1.00 mg/dL   Calcium 47.8 8.9 - 29.5 mg/dL   GFR calc non Af Amer 43 (L) >60 mL/min   GFR calc Af Amer 50 (L) >60 mL/min   Anion gap 11 5 - 15  CBC with Differential  Result Value Ref Range   WBC 9.7 4.0 - 10.5 K/uL   RBC 4.59 3.87 - 5.11 MIL/uL   Hemoglobin 11.4 (L) 12.0 - 15.0 g/dL   HCT 62.1 (L) 30.8 - 65.7 %   MCV 74.7 (L) 78.0 - 100.0 fL   MCH 24.8 (L) 26.0 - 34.0 pg   MCHC 33.2 30.0 - 36.0 g/dL   RDW 84.6 (H) 96.2 - 95.2 %   Platelets PENDING 150 - 400 K/uL   Neutrophils Relative % 85 %   Neutro Abs 8.2 (H) 1.7 - 7.7 K/uL   Lymphocytes Relative 11 %   Lymphs Abs 1.0 0.7 - 4.0 K/uL   Monocytes Relative 4 %   Monocytes Absolute 0.4 0.1 - 1.0 K/uL   Eosinophils Relative 0 %   Eosinophils Absolute 0.0 0.0 - 0.7 K/uL   Basophils Relative 0 %   Basophils Absolute 0.0 0.0 - 0.1 K/uL   Dg Chest 2 View  12/09/2015  CLINICAL DATA:  Cough and chest pain intermittent for the past week. Fatigue. EXAM: CHEST  2 VIEW COMPARISON:  05/20/2015  FINDINGS: Mild-to-moderate enlargement of the cardiopericardial silhouette with atherosclerotic calcification of the aortic arch. Bilateral severe degenerative glenohumeral arthropathy. Possible free osteochondral fragment posteriorly along the right glenohumeral joint. Mild thoracic spondylosis. IMPRESSION: 1. No acute thoracic findings. 2. Mild to moderate enlargement of the cardiopericardial silhouette, without edema. 3. Atherosclerotic aortic arch. 4. Prominent chronic degenerative glenohumeral arthropathy, possible free osteochondral fragment posteriorly along the right glenohumeral joint. Electronically Signed   By: Gaylyn Rong M.D.   On: 12/09/2015 09:19    Patient with one-week history of cough productive of clear sputum. Chest pain only with cough. Some nausea today but no vomiting or diarrhea. Felt like she had a fever yesterday. Temp here today is 100F.  Patient's chest x-rays negative for pneumonia. Lungs are clear bilaterally. Patient's oxygen saturation on room air is 99%. Patient nontoxic no acute distress. Will treat symptomatically.  Vanetta Mulders, MD 12/09/15 [phone]

## 2015-12-09 NOTE — ED Provider Notes (Signed)
CSN: [id number]     Arrival date & time 12/09/15  4098 History   First MD Initiated Contact with Patient 12/09/15 [phone]     Chief Complaint  Patient presents with  . Cough  . Chest Pain      Patient is a 75 y.o. female presenting with cough and chest pain. The history is provided by the patient. No language interpreter was used.  Cough Associated symptoms: chest pain, fever, rhinorrhea and shortness of breath   Associated symptoms: no ear pain, no headaches, no rash and no wheezing   Chest Pain Associated symptoms: cough, fever and shortness of breath   Associated symptoms: no abdominal pain, no back pain, no dysphagia, no headache, no palpitations and not vomiting    [name] is a 75 y.o. female who presents the emergency department complaining of a cough ongoing for the past week. She notes productive cough. She also reports associated sneezing, runny nose and postnasal drip. She reports pain in her chest only with coughing. She denies chest pain during my examination. She reports some slight shortness of breath. She denies any wheezing. She reports fever yesterday and took tylenol yesterday with some relief. She denies leg pain, leg swelling, hemoptysis, vomiting, diarrhea, abdominal pain, or syncope.   Past Medical History  Diagnosis Date  . Hypertension   . History of Graves' disease   . Hypercalcemia   . Allergic rhinitis   . Murmur, heart   . Back pain   . Renal insufficiency   . Dyslipidemia   . Osteoarthritis, knee   . Bronchiectasis   . Thoracic aortic aneurysm (HCC)   . Hx of colonic polyp   . Thrombocytopenia, unspecified (HCC) 08/02/2013  . Fatigue 08/02/2013  . 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 Thrush, oral 09/02/2013  . Hyperlipidemia   . GERD (gastroesophageal reflux disease)   . Anxiety   . Dysuria 10/14/2013  . Thrush 10/14/2013  . UTI (lower urinary tract infection) 10/14/2013  . Hypokalemia 11/15/2013  . Thyroid disease    Past  Surgical History  Procedure Laterality Date  . Abdominal hysterectomy      b/c fibroids  . Knee arthroplasty Right 10/2007  . Total knee arthroplasty Bilateral   . Appendectomy     Family History  Problem Relation Age of Onset  . Kidney cancer Brother 48  . Prostate cancer Brother   . Colon cancer Brother   . Breast cancer Maternal Aunt   . Liver cancer Maternal Uncle   . Colon polyps Brother   . Heart disease Mother   . Irritable bowel syndrome Maternal Aunt   . Kidney disease Mother     on dialysis   Social History  Substance Use Topics  . Smoking status: Former Smoker -- 1.00 packs/day for 10 years    Types: Cigarettes    Quit date: 11/11/1976  . Smokeless tobacco: Never Used  . Alcohol Use: No   OB History    No data available     Review of Systems  Constitutional: Positive for fever.  HENT: Positive for postnasal drip, rhinorrhea and sneezing. Negative for ear pain and trouble swallowing.   Eyes: Negative for visual disturbance.  Respiratory: Positive for cough and shortness of breath. Negative for chest tightness and wheezing.   Cardiovascular: Positive for chest pain. Negative for palpitations and leg swelling.  Gastrointestinal: Negative for vomiting, abdominal pain and diarrhea.  Genitourinary: Negative for dysuria and difficulty urinating.  Musculoskeletal: Negative for back pain.  Skin:  Negative for rash and wound.  Neurological: Negative for syncope, light-headedness and headaches.      Allergies  Codeine; Fish allergy; Oxycodone; Penicillins; Sulfonamide derivatives; Tramadol hcl; and Triamcinolone  Home Medications   Prior to Admission medications   Medication Sig Start Date End Date Taking? Authorizing Provider  acetaminophen (TYLENOL) 500 MG tablet Take 1 tablet (500 mg total) by mouth every 6 (six) hours as needed. 12/09/15   Everlene Farrier, PA-C  amLODipine (NORVASC) 5 MG tablet Take 5 mg by mouth daily.    Historical Provider, MD  aspirin 81  MG tablet Take 81 mg by mouth daily.      Historical Provider, MD  azithromycin (ZITHROMAX) 250 MG tablet Take 1 tablet (250 mg total) by mouth daily. Take first 2 tablets together, then 1 every day until finished. 12/09/15   Everlene Farrier, PA-C  benzonatate (TESSALON) 100 MG capsule Take 1 capsule (100 mg total) by mouth 3 (three) times daily as needed for cough. 12/09/15   Everlene Farrier, PA-C  cholecalciferol (VITAMIN D) 1000 UNITS tablet Take 1,000 Units by mouth daily.    Historical Provider, MD  citalopram (CELEXA) 20 MG tablet Take 20 mg by mouth daily.    Historical Provider, MD  ferrous sulfate 325 (65 FE) MG tablet Take 325 mg by mouth at bedtime.    Historical Provider, MD  furosemide (LASIX) 20 MG tablet Take 20 mg by mouth daily.  07/16/13   Historical Provider, MD  HYDROcodone-homatropine (HYDROMET) 5-1.5 MG/5ML syrup Take 5 mLs by mouth every 6 (six) hours as needed. 01/20/15   Tammy S Parrett, NP  hyoscyamine (LEVSIN SL) 0.125 MG SL tablet Place 1 tablet (0.125 mg total) under the tongue every 4 (four) hours as needed for cramping. 09/13/13   Louis Meckel, MD  KLOR-CON M20 20 MEQ tablet TAKE 1 TABLET (20 MEQ TOTAL) BY MOUTH 2 (TWO) TIMES DAILY. 06/26/14   Artis Delay, MD  levothyroxine (SYNTHROID, LEVOTHROID) 100 MCG tablet Take 100 mcg by mouth daily.      Historical Provider, MD  loratadine (CLARITIN) 10 MG tablet TAKE 1 TABLET (10 MG TOTAL) BY MOUTH DAILY. 10/24/14   Leslye Peer, MD  losartan (COZAAR) 100 MG tablet Take 100 mg by mouth daily.    Historical Provider, MD  lovastatin (MEVACOR) 40 MG tablet Take 40 mg by mouth at bedtime.      Historical Provider, MD  ondansetron (ZOFRAN) 4 MG tablet Take 1 tablet (4 mg total) by mouth every 6 (six) hours. 05/20/15   Gilda Crease, MD  pantoprazole (PROTONIX) 40 MG tablet TAKE 1 TABLET (40 MG TOTAL) BY MOUTH DAILY. 09/08/14   Louis Meckel, MD  potassium chloride SA (K-DUR,KLOR-CON) 20 MEQ tablet Take 1 tablet (20 mEq total) by  mouth 2 (two) times daily. 06/28/14   Artis Delay, MD  senna (SENOKOT) 8.6 MG tablet Take 1 tablet by mouth as needed.     Historical Provider, MD   BP 136/86 mmHg  Pulse 81  Temp(Src) 100 F (37.8 C) (Oral)  Resp 20  Ht 5' 5.5" (1.664 m)  SpO2 99% Physical Exam  Constitutional: She is oriented to person, place, and time. She appears well-developed and well-nourished. No distress.  Nontoxic appearing.  HENT:  Head: Normocephalic and atraumatic.  Right Ear: External ear normal.  Left Ear: External ear normal.  Mouth/Throat: Oropharynx is clear and moist.  No tonsillar hypertrophy or exudates. Uvula is midline without edema.  Eyes: Conjunctivae are normal. Pupils  are equal, round, and reactive to light. Right eye exhibits no discharge. Left eye exhibits no discharge.  Neck: Normal range of motion. Neck supple. No JVD present. No tracheal deviation present.  Cardiovascular: Normal rate, regular rhythm, normal heart sounds and intact distal pulses.  Exam reveals no gallop and no friction rub.   No murmur heard. Pulmonary/Chest: Effort normal. No respiratory distress. She has no wheezes. She has no rales. She exhibits tenderness.  Lung sounds clear bilaterally. No rales or rhonchi noted. No wheezing. No increased work of breathing. Symmetric chest expansion bilaterally. Substernal chest wall is tender to palpation and reproduces her chest pain.  Abdominal: Soft. She exhibits no distension. There is no tenderness. There is no guarding.  Musculoskeletal: She exhibits no edema or tenderness.  No lower extremity edema or tenderness.  Lymphadenopathy:    She has no cervical adenopathy.  Neurological: She is alert and oriented to person, place, and time. Coordination normal.  Skin: Skin is warm and dry. No rash noted. She is not diaphoretic. No erythema. No pallor.  Psychiatric: She has a normal mood and affect. Her behavior is normal.  Nursing note and vitals reviewed.   ED Course   Procedures (including critical care time) Labs Review Labs Reviewed  BASIC METABOLIC PANEL - Abnormal; Notable for the following:    Potassium 3.1 (*)    Glucose, Bld 128 (*)    Creatinine, Ser 1.20 (*)    GFR calc non Af Amer 43 (*)    GFR calc Af Amer 50 (*)    All other components within normal limits  CBC WITH DIFFERENTIAL/PLATELET - Abnormal; Notable for the following:    Hemoglobin 11.4 (*)    HCT 34.3 (*)    MCV 74.7 (*)    MCH 24.8 (*)    RDW 18.4 (*)    Neutro Abs 8.2 (*)    All other components within normal limits    Imaging Review Dg Chest 2 View  12/09/2015  CLINICAL DATA:  Cough and chest pain intermittent for the past week. Fatigue. EXAM: CHEST  2 VIEW COMPARISON:  05/20/2015 FINDINGS: Mild-to-moderate enlargement of the cardiopericardial silhouette with atherosclerotic calcification of the aortic arch. Bilateral severe degenerative glenohumeral arthropathy. Possible free osteochondral fragment posteriorly along the right glenohumeral joint. Mild thoracic spondylosis. IMPRESSION: 1. No acute thoracic findings. 2. Mild to moderate enlargement of the cardiopericardial silhouette, without edema. 3. Atherosclerotic aortic arch. 4. Prominent chronic degenerative glenohumeral arthropathy, possible free osteochondral fragment posteriorly along the right glenohumeral joint. Electronically Signed   By: Gaylyn Rong M.D.   On: 12/09/2015 09:19   I have personally reviewed and evaluated these images and lab results as part of my medical decision-making.   EKG Interpretation   Date/Time:  Saturday December 09 2015 08:27:26 EST Ventricular Rate:  90 PR Interval:  172 QRS Duration: 79 QT Interval:  384 QTC Calculation: 470 R Axis:   -5 Text Interpretation:  Sinus tachycardia Multiple ventricular premature  complexes Probable left atrial enlargement Borderline repolarization  abnormality Confirmed by ZACKOWSKI  MD, SCOTT ([id number]) on 12/09/2015 8:33:53  AM      Filed  Vitals:   12/09/15 0828 12/09/15 0940  BP: 170/82 136/86  Pulse: 85 81  Temp: 100 F (37.8 C)   TempSrc: Oral   Resp: 24 20  Height: 5' 5.5" (1.664 m)   SpO2: 99% 99%     MDM   Meds given in ED:  Medications  acetaminophen (TYLENOL) tablet 650 mg (650 mg  Oral Given 12/09/15 0940)    New Prescriptions   ACETAMINOPHEN (TYLENOL) 500 MG TABLET    Take 1 tablet (500 mg total) by mouth every 6 (six) hours as needed.   AZITHROMYCIN (ZITHROMAX) 250 MG TABLET    Take 1 tablet (250 mg total) by mouth daily. Take first 2 tablets together, then 1 every day until finished.   BENZONATATE (TESSALON) 100 MG CAPSULE    Take 1 capsule (100 mg total) by mouth 3 (three) times daily as needed for cough.    Final diagnoses:  Acute bronchitis, unspecified organism   This is a 75 y.o. female who presents the emergency department complaining of a cough ongoing for the past week. She notes clear productive cough. She also reports associated sneezing, runny nose and postnasal drip. She reports pain in her chest only with coughing. She denies chest pain during my examination. She reports some slight shortness of breath. She denies any wheezing. On exam the patient is nontoxic appearing. She has a temperature 100 F in the ED. Lungs are clear to auscultation bilaterally. No respiratory distress. Oxygen saturation is 99% on room air.  Chest x-ray shows no evidence of pneumonia. No acute thoracic findings. This showed mild to moderate enlargement of the cardiopericardial silhouette without edema. No leukocytosis on CBC. BMP shows improvement of creatinine 1.20 from her most recent blood work. Potassium is 3.1. No evidence of pneumonia. Will discharge with prescription for azithromycin due to patient's history and age. Will also prescribe Tessalon Perles and Tylenol. I encouraged her to follow-up closely with her primary care provider. I discussed strict return precautions. I advised the patient to follow-up with  their primary care provider this week. I advised the patient to return to the emergency department with new or worsening symptoms or new concerns. The patient verbalized understanding and agreement with plan.    This patient was discussed with and evaluated by Dr. Deretha Emory who agrees with assessment and plan.    Everlene Farrier, PA-C 12/09/15 [phone]

## 2015-12-09 NOTE — ED Notes (Signed)
Patient transported to X-ray 

## 2015-12-09 NOTE — Discharge Instructions (Signed)

## 2015-12-19 DIAGNOSIS — G894 Chronic pain syndrome: Secondary | ICD-10-CM | POA: Diagnosis not present

## 2015-12-19 DIAGNOSIS — M533 Sacrococcygeal disorders, not elsewhere classified: Secondary | ICD-10-CM | POA: Diagnosis not present

## 2015-12-19 DIAGNOSIS — M5137 Other intervertebral disc degeneration, lumbosacral region: Secondary | ICD-10-CM | POA: Diagnosis not present

## 2015-12-19 DIAGNOSIS — M47817 Spondylosis without myelopathy or radiculopathy, lumbosacral region: Secondary | ICD-10-CM | POA: Diagnosis not present

## 2015-12-19 DIAGNOSIS — E669 Obesity, unspecified: Secondary | ICD-10-CM | POA: Diagnosis not present

## 2015-12-19 DIAGNOSIS — Z79899 Other long term (current) drug therapy: Secondary | ICD-10-CM | POA: Diagnosis not present

## 2015-12-20 ENCOUNTER — Encounter: Payer: Self-pay | Admitting: Acute Care

## 2015-12-20 ENCOUNTER — Ambulatory Visit (INDEPENDENT_AMBULATORY_CARE_PROVIDER_SITE_OTHER): Payer: Medicare Other | Admitting: Acute Care

## 2015-12-20 VITALS — BP 136/84 | HR 82 | Ht 65.5 in | Wt 201.0 lb

## 2015-12-20 DIAGNOSIS — J479 Bronchiectasis, uncomplicated: Secondary | ICD-10-CM | POA: Diagnosis not present

## 2015-12-20 MED ORDER — PANTOPRAZOLE SODIUM 40 MG PO TBEC: DELAYED_RELEASE_TABLET | ORAL | Status: DC

## 2015-12-20 MED ORDER — GUAIFENESIN ER 600 MG PO TB12: 600.0000 mg | ORAL_TABLET | Freq: Two times a day (BID) | ORAL | Status: DC

## 2015-12-20 MED ORDER — DOXYCYCLINE HYCLATE 100 MG PO TABS: 100.0000 mg | ORAL_TABLET | Freq: Two times a day (BID) | ORAL | Status: DC

## 2015-12-20 NOTE — Assessment & Plan Note (Signed)
Stable Plan:  Continue your claritin each day Follow up with the office for seasonal worsening as needed

## 2015-12-20 NOTE — Progress Notes (Signed)
Subjective:    Patient ID: [name], female    DOB: [birth year], 75 y.o.   MRN: [id number]  HPI  Ms. [name] is a 75 year old woman with a history of hypertension, hypercholesterolemia, hypothyroidism, allergic rhinitis, bronchiectasis, cough, and renal insufficiency followed by Dr. Elvis Coil. She was seen in 2007-8 for pulmonary nodules, mediastinal LAD and basilar bronchiectasis on CT scan. Bx of an axillary node showed no evidence for sarcoidosis.   12/09/2015: Recent emergency department visit for acute bronchitis : Treated with Z-Pak. Tessalon Perles  12/09/15:  EXAM: CHEST 2 VIEW  COMPARISON: 05/20/2015  FINDINGS: Mild-to-moderate enlargement of the cardiopericardial silhouette with atherosclerotic calcification of the aortic arch.  Bilateral severe degenerative glenohumeral arthropathy. Possible free osteochondral fragment posteriorly along the right glenohumeral joint. Mild thoracic spondylosis.  IMPRESSION: 1. No acute thoracic findings. 2. Mild to moderate enlargement of the cardiopericardial silhouette, without edema. 3. Atherosclerotic aortic arch. 4. Prominent chronic degenerative glenohumeral arthropathy, possible free osteochondral fragment posteriorly along the right glenohumeral Joint  12/20/2015: Acute office visit Patient presents to the office with complaint of cough which is productive for thick green sputum. Patient denies fever, chills, leg pain, hemoptysis or chest pain. Patient endorses recent emergency room visit on 12/09/2015 for what was deemed to be acute bronchitis. Chest x-ray done at the time of the emergency room visit showed no evidence of pneumonia or any acute thoracic finding. She was given a Z-Pak and Tessalon Perles and encouraged to use Tylenol for body aches. Her symptoms have persisted for approximately 2 weeks. She is following up today for continued care of the unresolved cough which is productive for thick green sputum.  Patient is in no distress, and saturation on room air is 100%,   Current outpatient prescriptions:  .  acetaminophen (TYLENOL) 500 MG tablet, Take 1 tablet (500 mg total) by mouth every 6 (six) hours as needed., Disp: 30 tablet, Rfl: 0 .  amLODipine (NORVASC) 5 MG tablet, Take 5 mg by mouth daily., Disp: , Rfl:  .  aspirin 81 MG tablet, Take 81 mg by mouth daily.  , Disp: , Rfl:  .  benzonatate (TESSALON) 100 MG capsule, Take 1 capsule (100 mg total) by mouth 3 (three) times daily as needed for cough., Disp: 21 capsule, Rfl: 0 .  cholecalciferol (VITAMIN D) 1000 UNITS tablet, Take 1,000 Units by mouth daily., Disp: , Rfl:  .  citalopram (CELEXA) 20 MG tablet, Take 20 mg by mouth daily., Disp: , Rfl:  .  ferrous sulfate 325 (65 FE) MG tablet, Take 325 mg by mouth at bedtime., Disp: , Rfl:  .  furosemide (LASIX) 20 MG tablet, Take 20 mg by mouth daily. , Disp: , Rfl:  .  hyoscyamine (LEVSIN SL) 0.125 MG SL tablet, Place 1 tablet (0.125 mg total) under the tongue every 4 (four) hours as needed for cramping., Disp: 30 tablet, Rfl: 2 .  KLOR-CON M20 20 MEQ tablet, TAKE 1 TABLET (20 MEQ TOTAL) BY MOUTH 2 (TWO) TIMES DAILY., Disp: 60 tablet, Rfl: 1 .  levothyroxine (SYNTHROID, LEVOTHROID) 100 MCG tablet, Take 100 mcg by mouth daily.  , Disp: , Rfl:  .  loratadine (CLARITIN) 10 MG tablet, TAKE 1 TABLET (10 MG TOTAL) BY MOUTH DAILY., Disp: 30 tablet, Rfl: 11 .  losartan (COZAAR) 100 MG tablet, Take 100 mg by mouth daily., Disp: , Rfl:  .  lovastatin (MEVACOR) 40 MG tablet, Take 40 mg by mouth at bedtime.  , Disp: , Rfl:  .  ondansetron (ZOFRAN) 4 MG tablet, Take 1 tablet (4 mg total) by mouth every 6 (six) hours., Disp: 12 tablet, Rfl: 0 .  pantoprazole (PROTONIX) 40 MG tablet, TAKE 1 TABLET (40 MG TOTAL) BY MOUTH DAILY., Disp: 90 tablet, Rfl: 1 .  potassium chloride SA (K-DUR,KLOR-CON) 20 MEQ tablet, Take 1 tablet (20 mEq total) by mouth 2 (two) times daily., Disp: 90 tablet, Rfl: 0 .  senna (SENOKOT)  8.6 MG tablet, Take 1 tablet by mouth as needed. , Disp: , Rfl:  .  doxycycline (VIBRA-TABS) 100 MG tablet, Take 1 tablet (100 mg total) by mouth 2 (two) times daily., Disp: 20 tablet, Rfl: 0 .  guaiFENesin (MUCINEX) 600 MG 12 hr tablet, Take 1 tablet (600 mg total) by mouth 2 (two) times daily., Disp: 60 tablet, Rfl: 1   Past Medical History  Diagnosis Date  . Hypertension   . History of Graves' disease   . Hypercalcemia   . Allergic rhinitis   . Murmur, heart   . Back pain   . Renal insufficiency   . Dyslipidemia   . Osteoarthritis, knee   . Bronchiectasis   . Thoracic aortic aneurysm (HCC)   . Hx of colonic polyp   . Thrombocytopenia, unspecified (HCC) 08/02/2013  . Fatigue 08/02/2013  . 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 Thrush, oral 09/02/2013  . Hyperlipidemia   . GERD (gastroesophageal reflux disease)   . Anxiety   . Dysuria 10/14/2013  . Thrush 10/14/2013  . UTI (lower urinary tract infection) 10/14/2013  . Hypokalemia 11/15/2013  . Thyroid disease     Allergies  Allergen Reactions  . Codeine     REACTION: Nausea/vomiting  . Fish Allergy Nausea Only  . Oxycodone Nausea And Vomiting  . Penicillins     REACTION: headache, tremors, Nausea  . Sulfonamide Derivatives     REACTION: mouth swells  . Tramadol Hcl     REACTION: GI Intolerance  . Triamcinolone     REACTION: angio edema  .  Review of Systems Constitutional:   No  weight loss, night sweats,  Fevers, chills, fatigue, or  lassitude.  HEENT:   No headaches,  Difficulty swallowing,  Tooth/dental problems, or  Sore throat,                No sneezing, itching, ear ache, nasal congestion, +post nasal drip,   CV:  No chest pain,  Orthopnea, PND, swelling in lower extremities, anasarca, dizziness, palpitations, syncope.   GI  No heartburn, indigestion, abdominal pain, nausea, vomiting, diarrhea, change in bowel habits, loss of appetite, bloody stools.   Resp: No shortness of breath with  exertion or at rest.  + excess mucus, + productive cough,  No non-productive cough,  No coughing up of blood.  + change in color of mucus.  No wheezing.  No chest wall deformity  Skin: no rash or lesions.  GU: no dysuria, change in color of urine, no urgency or frequency.  No flank pain, no hematuria   MS:  No joint pain or swelling.  No decreased range of motion.  No back pain.  Psych:  No change in mood or affect. No depression or anxiety.  No memory loss.        Objective:   Physical Exam  BP 136/84 mmHg  Pulse 82  Ht 5' 5.5" (1.664 m)  Wt 201 lb (91.173 kg)  BMI 32.93 kg/m2  SpO2 100%  Physical Exam:  General- No distress,  A&Ox3, nontoxic-appearing. ENT: No  sinus tenderness, TM clear, pale nasal mucosa, no oral exudate,+ post nasal drip, no LAN Cardiac: S1, S2, regular rate and rhythm, no murmur Chest: No wheeze/ rales/ dullness; no accessory muscle use, no nasal flaring, no sternal retractions Abd.: Soft Non-tender Ext: No clubbing cyanosis, edema Neuro:  normal strength Skin: No rashes, warm and dry Psych: normal mood and behavior     Assessment & Plan:

## 2015-12-20 NOTE — Patient Instructions (Addendum)
We will send in a prescription for Augmentin 875 mg twice daily for 10 days. Probiotics while on antibiotics, Align or Culturelle or generic equivalent. Mucinex 600 mg twice daily . Continue your claritin Continue your protonix, we will renew your prescription today. Continue the Tessilon Perles for cough Follow up in 2 weeks with Dr. Delton Coombes Please contact office for sooner follow up if symptoms do not improve or worsen or seek emergency care

## 2015-12-20 NOTE — Assessment & Plan Note (Addendum)
Mild Flare that did not resolve with treatment with Z-pack Continued green sputum. Continued cough  Plan Initially planned for Augmentin 875 mg twice daily for 10 days. However due to drug to drug interactions and the patient's allergy history this was changed to doxycycline 100 mg BID was prescribed. Probiotics while on antibiotics, Align or Culturelle or generic equivalent. Mucinex 600 mg twice daily . Continue your claritin Continue your protonix, we will renew your prescription today. Continue the Tessilon Perles for cough Follow up in 2 weeks with Dr. Delton Coombes Please contact office for sooner follow up if symptoms do not improve or worsen or seek emergency care

## 2015-12-23 ENCOUNTER — Other Ambulatory Visit: Payer: Self-pay | Admitting: Emergency Medicine

## 2016-01-03 DIAGNOSIS — M19019 Primary osteoarthritis, unspecified shoulder: Secondary | ICD-10-CM | POA: Diagnosis not present

## 2016-01-03 DIAGNOSIS — M75100 Unspecified rotator cuff tear or rupture of unspecified shoulder, not specified as traumatic: Secondary | ICD-10-CM | POA: Diagnosis not present

## 2016-01-03 DIAGNOSIS — M25519 Pain in unspecified shoulder: Secondary | ICD-10-CM | POA: Diagnosis not present

## 2016-01-16 ENCOUNTER — Encounter: Payer: Self-pay | Admitting: Gastroenterology

## 2016-01-16 DIAGNOSIS — M4696 Unspecified inflammatory spondylopathy, lumbar region: Secondary | ICD-10-CM | POA: Diagnosis not present

## 2016-01-16 DIAGNOSIS — M533 Sacrococcygeal disorders, not elsewhere classified: Secondary | ICD-10-CM | POA: Diagnosis not present

## 2016-01-16 DIAGNOSIS — M47817 Spondylosis without myelopathy or radiculopathy, lumbosacral region: Secondary | ICD-10-CM | POA: Diagnosis not present

## 2016-01-16 DIAGNOSIS — G894 Chronic pain syndrome: Secondary | ICD-10-CM | POA: Diagnosis not present

## 2016-01-16 DIAGNOSIS — M5137 Other intervertebral disc degeneration, lumbosacral region: Secondary | ICD-10-CM | POA: Diagnosis not present

## 2016-01-16 DIAGNOSIS — Z79899 Other long term (current) drug therapy: Secondary | ICD-10-CM | POA: Diagnosis not present

## 2016-01-18 ENCOUNTER — Encounter: Payer: Self-pay | Admitting: Emergency Medicine

## 2016-01-18 ENCOUNTER — Ambulatory Visit (INDEPENDENT_AMBULATORY_CARE_PROVIDER_SITE_OTHER): Payer: Medicare Other | Admitting: Emergency Medicine

## 2016-01-18 VITALS — BP 124/84 | HR 98 | Ht 65.0 in | Wt 202.0 lb

## 2016-01-18 DIAGNOSIS — J479 Bronchiectasis, uncomplicated: Secondary | ICD-10-CM

## 2016-01-18 NOTE — Assessment & Plan Note (Signed)
With recent acute exacerbation and bronchitis that was refractory to azithromycin but which improved with Augmentin. If she has a recurrent flare or an increase in her sputum we will obtain cultures. I would like to repeat a high-resolution CT scan of her chest to assess the degree of her bronchiectasis and micronodular disease. I'll follow with her in 6 months or when necessary

## 2016-01-18 NOTE — Progress Notes (Signed)
[name] is a 75 year old woman with a history of hypertension, hypercholesterolemia, hypothyroidism, allergic rhinitis, and renal insufficiency followed by Dr. Elvis Coil. I saw her in 2007-8 for pulmonary nodules, mediastinal LAD and basilar bronchiectasis on CT scan. Bx of an axillary node showed no evidence for sarcoidosis.   ROV 09/07/14 -- follow up visit for bronchiectasis, Nodular disease, cough, allergic rhinitis.  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  She has noticed some increase in her daily cough since she turned her heat on.   ROV 01/18/16 -- patient has a history of bronchiectasis and micronodular disease associated with chronic cough. She has undergone an axillary lymph node biopsy in the past that was negative for sarcoidosis although this disease had been expected. She was recently seen in our office following an ER visit when she developed purulent sputum, and increase in her cough and symptoms consistent with a bronchitis and exacerbation of her bronchiectasis. He was initially treated with azithromycin but continued to have symptoms and so was then treated with Augmentin, Mucinex (now completed). She is on loratadine now. Feels better. Her last CT scan was in 2012. She is coughing up scant sputum, occasional plugs.    Filed Vitals:   01/18/16 1530  BP: 124/84  Pulse: 98  Height:  (1.651 m)  Weight: 202 lb (91.627 kg)  SpO2: 100%   Gen: Pleasant, well-nourished, in no distress,  normal affect  ENT: No lesions,  mouth clear,  oropharynx clear, no postnasal drip  Neck: No JVD, no TMG, no carotid bruits  Lungs: No use of accessory muscles, no dullness to percussion, clear without rales or rhonchi  Cardiovascular: RRR, heart sounds normal, no murmur or gallops, no peripheral edema  Musculoskeletal: No deformities, no cyanosis or clubbing  Neuro: alert, non focal  Skin: Warm, no lesions or rashes   CXR 06/10/13 --   Comparison: Chest x-ray 06/28/2011.  Findings: Lung volumes are normal. No acute consolidative air  space disease. No definite pleural effusions. No evidence of  pulmonary edema. No definite suspicious appearing pulmonary  nodules or masses are identified on this plain film examination.  No evidence of pulmonary edema. Borderline cardiomegaly is  unchanged. Extensive atherosclerosis of the thoracic aorta.  IMPRESSION:  1. No radiographic evidence of acute cardiopulmonary disease.  2. Atherosclerosis.  3. Borderline cardiomegaly.     CT scan chest 12/19/10: Spirometry today  = normal airflow Comparison: 05/16/2011and 09/12/2008.  Findings: No pathologically enlarged mediastinal lymph nodes.  Right hilar lymph nodes are sub centimeter in size and unchanged  from 09/12/2008, as is an 11 mm short axis left axillary lymph  node. There is atherosclerotic calcification of the arterial  vasculature. Ascending aorta measures 4.0 cm (previously 3.8 cm).  Coronary artery calcification. Heart is mildly enlarged. No  pericardial effusion.  There are scattered parenchymal subpleural pulmonary nodules which  are unchanged from 09/12/2008, and are therefore considered benign.  Question mild subpleural reticulation at the lung bases. Scarring  in both lower lobes. No pleural fluid. Airway is unremarkable.  Incidental imaging of the upper abdomen shows a 1.6 cm low  attenuation lesion in the periphery of the left hepatic lobe.  There may be an exophytic low attenuation lesion off the upper pole  left kidney measuring 2 cm, which is incompletely imaged. No  worrisome lytic or sclerotic lesions.  Review of the MIP images confirms the above findings.  IMPRESSION:  1. Minimally  prominent ascending aorta.  2. Coronary artery calcification.  3. Question mild basilar subpleural fibrosis.   BRONCHIECTASIS With recent acute exacerbation and bronchitis that was refractory to azithromycin but which  improved with Augmentin. If she has a recurrent flare or an increase in her sputum we will obtain cultures. I would like to repeat a high-resolution CT scan of her chest to assess the degree of her bronchiectasis and micronodular disease. I'll follow with her in 6 months or when necessary

## 2016-01-18 NOTE — Patient Instructions (Signed)
We will perform a CT scan of the chest to evaluate your bronchiectasis Please continue all your medications as you have been taking them Follow with Dr Delton CoombesByrum in 6 months or sooner if you have any problems

## 2016-01-25 ENCOUNTER — Ambulatory Visit (INDEPENDENT_AMBULATORY_CARE_PROVIDER_SITE_OTHER)
Admission: RE | Admit: 2016-01-25 | Discharge: 2016-01-25 | Disposition: A | Discharge status: Home / Self Care | Payer: Medicare Other | Source: Ambulatory Visit | Attending: Emergency Medicine | Admitting: Emergency Medicine

## 2016-01-25 DIAGNOSIS — J479 Bronchiectasis, uncomplicated: Secondary | ICD-10-CM

## 2016-01-26 ENCOUNTER — Inpatient Hospital Stay (HOSPITAL_COMMUNITY)
Admission: AD | Admit: 2016-01-26 | Discharge: 2016-01-28 | DRG: 813 | Disposition: A | Discharge status: Home / Self Care | Payer: Medicare Other | Source: Ambulatory Visit | Attending: Hematology and Oncology | Admitting: Hematology and Oncology

## 2016-01-26 ENCOUNTER — Encounter: Payer: Medicare Other | Admitting: Hematology and Oncology

## 2016-01-26 ENCOUNTER — Other Ambulatory Visit (HOSPITAL_BASED_OUTPATIENT_CLINIC_OR_DEPARTMENT_OTHER): Payer: Medicare Other

## 2016-01-26 ENCOUNTER — Encounter (HOSPITAL_COMMUNITY): Payer: Self-pay | Admitting: Orthopedic Surgery

## 2016-01-26 DIAGNOSIS — D649 Anemia, unspecified: Secondary | ICD-10-CM | POA: Diagnosis not present

## 2016-01-26 DIAGNOSIS — E669 Obesity, unspecified: Secondary | ICD-10-CM | POA: Diagnosis present

## 2016-01-26 DIAGNOSIS — Z6833 Body mass index (BMI) 33.0-33.9, adult: Secondary | ICD-10-CM

## 2016-01-26 DIAGNOSIS — I10 Essential (primary) hypertension: Secondary | ICD-10-CM | POA: Diagnosis not present

## 2016-01-26 DIAGNOSIS — D638 Anemia in other chronic diseases classified elsewhere: Secondary | ICD-10-CM

## 2016-01-26 DIAGNOSIS — R042 Hemoptysis: Secondary | ICD-10-CM

## 2016-01-26 DIAGNOSIS — D693 Immune thrombocytopenic purpura: Secondary | ICD-10-CM | POA: Diagnosis not present

## 2016-01-26 DIAGNOSIS — J479 Bronchiectasis, uncomplicated: Secondary | ICD-10-CM | POA: Diagnosis not present

## 2016-01-26 DIAGNOSIS — Z96653 Presence of artificial knee joint, bilateral: Secondary | ICD-10-CM | POA: Diagnosis not present

## 2016-01-26 DIAGNOSIS — Z8249 Family history of ischemic heart disease and other diseases of the circulatory system: Secondary | ICD-10-CM | POA: Diagnosis not present

## 2016-01-26 DIAGNOSIS — D696 Thrombocytopenia, unspecified: Secondary | ICD-10-CM | POA: Diagnosis present

## 2016-01-26 LAB — CBC & DIFF AND RETIC
BASO%: 0.2 % (ref 0.0–2.0)
Basophils Absolute: 0 10*3/uL (ref 0.0–0.1)
EOS%: 0.5 % (ref 0.0–7.0)
Eosinophils Absolute: 0 10*3/uL (ref 0.0–0.5)
HEMATOCRIT: 33.5 % — AB (ref 34.8–46.6)
HGB: 11.1 g/dL — ABNORMAL LOW (ref 11.6–15.9)
IMMATURE RETIC FRACT: 6.4 % (ref 1.60–10.00)
LYMPH%: 33.7 % (ref 14.0–49.7)
MCH: 25.6 pg (ref 25.1–34.0)
MCHC: 33.1 g/dL (ref 31.5–36.0)
MCV: 77.4 fL — ABNORMAL LOW (ref 79.5–101.0)
MONO#: 0.3 10*3/uL (ref 0.1–0.9)
MONO%: 5.1 % (ref 0.0–14.0)
NEUT#: 3.4 10*3/uL (ref 1.5–6.5)
NEUT%: 60.5 % (ref 38.4–76.8)
Platelets: 9 10*3/uL — CL (ref 145–400)
RBC: 4.33 10*6/uL (ref 3.70–5.45)
RDW: 20.7 % — ABNORMAL HIGH (ref 11.2–14.5)
RETIC %: 1.24 % (ref 0.70–2.10)
RETIC CT ABS: 53.69 10*3/uL (ref 33.70–90.70)
WBC: 5.7 10*3/uL (ref 3.9–10.3)
lymph#: 1.9 10*3/uL (ref 0.9–3.3)

## 2016-01-26 LAB — FERRITIN: Ferritin: 60 ng/ml (ref 9–269)

## 2016-01-26 MED ORDER — GUAIFENESIN-DM 100-10 MG/5ML PO SYRP: 10.0000 mL | ORAL_SOLUTION | ORAL | Status: DC | PRN

## 2016-01-26 MED ORDER — LEVOTHYROXINE SODIUM 100 MCG PO TABS
100.0000 ug | ORAL_TABLET | Freq: Every day | ORAL | Status: DC
  Administered 2016-01-27 – 2016-01-28 (×2): 100 ug via ORAL
  Filled 2016-01-26 (×3): qty 1

## 2016-01-26 MED ORDER — FUROSEMIDE 20 MG PO TABS: 20.0000 mg | ORAL_TABLET | Freq: Every day | ORAL | Status: DC

## 2016-01-26 MED ORDER — SENNA 8.6 MG PO TABS
1.0000 | ORAL_TABLET | Freq: Every day | ORAL | Status: DC
  Administered 2016-01-26 – 2016-01-27 (×2): 8.6 mg via ORAL
  Filled 2016-01-26 (×3): qty 1

## 2016-01-26 MED ORDER — ONDANSETRON HCL 4 MG/2ML IJ SOLN: 4.0000 mg | Freq: Three times a day (TID) | INTRAMUSCULAR | Status: DC | PRN

## 2016-01-26 MED ORDER — DEXAMETHASONE SODIUM PHOSPHATE 4 MG/ML IJ SOLN: 40.0000 mg | Freq: Every day | INTRAMUSCULAR | Status: DC

## 2016-01-26 MED ORDER — PANTOPRAZOLE SODIUM 40 MG PO TBEC
40.0000 mg | DELAYED_RELEASE_TABLET | Freq: Every day | ORAL | Status: DC
  Administered 2016-01-27 – 2016-01-28 (×2): 40 mg via ORAL
  Filled 2016-01-26 (×3): qty 1

## 2016-01-26 MED ORDER — IMMUNE GLOBULIN (HUMAN) 20 GM/200ML IV SOLN
1.0000 g/kg | INTRAVENOUS | Status: AC
  Administered 2016-01-26 – 2016-01-27 (×2): 90 g via INTRAVENOUS
  Filled 2016-01-26 (×2): qty 100

## 2016-01-26 MED ORDER — IMMUNE GLOBULIN (HUMAN) 10 GM/100ML IV SOLN: 1.0000 g/kg | INTRAVENOUS | Status: DC

## 2016-01-26 MED ORDER — ONDANSETRON HCL 40 MG/20ML IJ SOLN
8.0000 mg | Freq: Three times a day (TID) | INTRAMUSCULAR | Status: DC | PRN
  Filled 2016-01-26: qty 4

## 2016-01-26 MED ORDER — LOSARTAN POTASSIUM 50 MG PO TABS
100.0000 mg | ORAL_TABLET | Freq: Every day | ORAL | Status: DC
  Administered 2016-01-27 – 2016-01-28 (×2): 100 mg via ORAL
  Filled 2016-01-26 (×3): qty 2

## 2016-01-26 MED ORDER — AMLODIPINE BESYLATE 5 MG PO TABS
10.0000 mg | ORAL_TABLET | Freq: Two times a day (BID) | ORAL | Status: DC
  Administered 2016-01-26: 10 mg via ORAL
  Filled 2016-01-26 (×3): qty 2

## 2016-01-26 MED ORDER — VITAMIN D3 25 MCG (1000 UNIT) PO TABS
1000.0000 [IU] | ORAL_TABLET | Freq: Every day | ORAL | Status: DC
  Administered 2016-01-27 – 2016-01-28 (×2): 1000 [IU] via ORAL
  Filled 2016-01-26 (×2): qty 1

## 2016-01-26 MED ORDER — SENNOSIDES-DOCUSATE SODIUM 8.6-50 MG PO TABS: 1.0000 | ORAL_TABLET | Freq: Every evening | ORAL | Status: DC | PRN

## 2016-01-26 MED ORDER — ACETAMINOPHEN 325 MG PO TABS: 650.0000 mg | ORAL_TABLET | ORAL | Status: DC | PRN

## 2016-01-26 MED ORDER — CITALOPRAM HYDROBROMIDE 20 MG PO TABS
20.0000 mg | ORAL_TABLET | Freq: Every day | ORAL | Status: DC
  Administered 2016-01-27 – 2016-01-28 (×2): 20 mg via ORAL
  Filled 2016-01-26 (×2): qty 1

## 2016-01-26 MED ORDER — BUPRENORPHINE 5 MCG/HR TD PTWK
5.0000 ug | MEDICATED_PATCH | TRANSDERMAL | Status: DC
  Administered 2016-01-27: 5 ug via TRANSDERMAL

## 2016-01-26 MED ORDER — ONDANSETRON 4 MG PO TBDP: 4.0000 mg | ORAL_TABLET | Freq: Three times a day (TID) | ORAL | Status: DC | PRN

## 2016-01-26 MED ORDER — INFLUENZA VAC SPLIT QUAD 0.5 ML IM SUSY
0.5000 mL | PREFILLED_SYRINGE | INTRAMUSCULAR | Status: DC
  Filled 2016-01-26 (×3): qty 0.5

## 2016-01-26 MED ORDER — ONDANSETRON HCL 4 MG PO TABS: 4.0000 mg | ORAL_TABLET | Freq: Three times a day (TID) | ORAL | Status: DC | PRN

## 2016-01-26 MED ORDER — SODIUM CHLORIDE 0.9 % IV SOLN
40.0000 mg | Freq: Every day | INTRAVENOUS | Status: DC
  Administered 2016-01-26 – 2016-01-28 (×3): 40 mg via INTRAVENOUS
  Filled 2016-01-26 (×3): qty 4

## 2016-01-26 NOTE — H&P (Signed)
[facility] ADMISSION NOTE  Patient Care Team: [name], MD as PCP - General (Internal Medicine) Heath Lark, MD as Consulting Physician (Hematology and Oncology)  CHIEF COMPLAINTS/PURPOSE OF ADMISSION:  Recurrent, severe ITP  HISTORY OF PRESENTING ILLNESS:  [name] 75 y.o. female is here because of thrombocytopenia. This patient has extensive history of recurrent ITP in the past. Summary of hematology history as follows:  #1 2012, she has thrombocytopenia that resolved spontaneously. The patient also recalled taking prednisone in the past for possible diagnosis of lupus #2 04/12/2013 she had mild microcytic anemia #3 07/09/2013 she has persistent mild microcytic anemia and thrombocytopenia with a platelet count of 51,000 #4 08/02/2013 I saw the patient and recommended a bone marrow aspirate and biopsy #5 08/06/2013 I perform bone marrow aspirate and biopsy. The patient received 1 unit of platelets for severe thrombocytopenia with a platelet count of 5000 #6 08/11/13: she is started on prednisone 80 mg daily #7 08/19/13: platelet count improves to 222. We appreciated steroid taper #8 09/02/2013 her platelet count dropped to 90,000. We increased prednisone back to 60 mg a day #9 09/16/13: dose of prednisone was reduced back to 40 mg #10 09/30/13: prednisone dose was reduced to 20 mg #11 10/14/13: prednisone dose was reduced to 10 mg #12 10/21/13: Prednisone dose was reduced to 5 mg #13 11/01/13: Prednisone dose was reduced to 2.5 mg #14 11/15/2013: Prednisone dose was reduced to 2.5 mg every other day #15 12/27/2013, prednisone is discontinued  Since the last time I saw her, she has been to the emergency room recently for acute bronchitis and with recurrent hemoptysis. She has seen pulmonologist recently for follow-up and had recent CT scan. In my office today and was directly admitted due to severe thrombocytopenia and recurrent hemoptysis She denies recent  bruising/bleeding, such as spontaneous epistaxis, hematuria, melena or hematochezia. The patient denies history of liver disease, exposure to heparin, history of cardiac murmur/prior cardiovascular surgery or recent new medications She denies prior blood or platelet transfusions Apart from the hemoptysis, she denies fevers or chills  MEDICAL HISTORY:  Past Medical History  Diagnosis Date  . Hypertension   . History of Graves' disease   . Hypercalcemia   . Allergic rhinitis   . Murmur, heart   . Back pain   . Renal insufficiency   . Dyslipidemia   . Osteoarthritis, knee   . Bronchiectasis   . Thoracic aortic aneurysm ([place])   . Hx of colonic polyp   . Thrombocytopenia, unspecified ([place]) 08/02/2013  . Fatigue 08/02/2013  . 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 Thrush, oral 09/02/2013  . Hyperlipidemia   . GERD (gastroesophageal reflux disease)   . Anxiety   . Dysuria 10/14/2013  . Thrush 10/14/2013  . UTI (lower urinary tract infection) 10/14/2013  . Hypokalemia 11/15/2013  . Thyroid disease     SURGICAL HISTORY: Past Surgical History  Procedure Laterality Date  . Abdominal hysterectomy      b/c fibroids  . Knee arthroplasty Right 10/2007  . Total knee arthroplasty Bilateral   . Appendectomy      SOCIAL HISTORY: Social History   Social History  . Marital Status: Single    Spouse Name: N/A  . Number of Children: N/A  . Years of Education: N/A   Occupational History  . Not on file.   Social History Main Topics  . Smoking status: Former Smoker -- 1.00 packs/day for 10 years    Types: Cigarettes  Quit date: 11/11/1976  . Smokeless tobacco: Never Used  . Alcohol Use: No  . Drug Use: No  . Sexual Activity: Not on file   Other Topics Concern  . Not on file   Social History Narrative    FAMILY HISTORY: Family History  Problem Relation Age of Onset  . Kidney cancer Brother 51  . Prostate cancer Brother   . Colon cancer Brother   . Breast  cancer Maternal Aunt   . Liver cancer Maternal Uncle   . Colon polyps Brother   . Heart disease Mother   . Irritable bowel syndrome Maternal Aunt   . Kidney disease Mother     on dialysis    ALLERGIES:  is allergic to codeine; fish allergy; oxycodone; penicillins; sulfonamide derivatives; tramadol hcl; and triamcinolone.  MEDICATIONS:  Current Facility-Administered Medications  Medication Dose Route Frequency Provider Last Rate Last Dose  . acetaminophen (TYLENOL) tablet 650 mg  650 mg Oral Q4H PRN Heath Lark, MD      . amLODipine (NORVASC) tablet 5 mg  5 mg Oral Daily Heath Lark, MD      . buprenorphine (BUTRANS - dosed mcg/hr) patch 5 mcg  5 mcg Transdermal Weekly Heath Lark, MD      . cholecalciferol (VITAMIN D) tablet 1,000 Units  1,000 Units Oral Daily Heath Lark, MD      . citalopram (CELEXA) tablet 20 mg  20 mg Oral Daily Saket Hellstrom, MD      . dexamethasone (DECADRON) injection 40 mg  40 mg Intravenous Daily Benton Tooker, MD      . furosemide (LASIX) tablet 20 mg  20 mg Oral Daily Jerimyah Vandunk, MD      . guaiFENesin-dextromethorphan (ROBITUSSIN DM) 100-10 MG/5ML syrup 10 mL  10 mL Oral Q4H PRN Heath Lark, MD      . Immune Globulin 10% (OCTAGAM) IV infusion 90 g  1 g/kg Intravenous Q24H Douglas Rooks, MD      . levothyroxine (SYNTHROID, LEVOTHROID) tablet 100 mcg  100 mcg Oral Daily Yamil Dougher, MD      . losartan (COZAAR) tablet 100 mg  100 mg Oral Daily Khaleem Burchill, MD      . ondansetron (ZOFRAN) tablet 4-8 mg  4-8 mg Oral Q8H PRN Heath Lark, MD       Or  . ondansetron (ZOFRAN-ODT) disintegrating tablet 4-8 mg  4-8 mg Oral Q8H PRN Heath Lark, MD       Or  . ondansetron (ZOFRAN) injection 4 mg  4 mg Intravenous Q8H PRN Ranie Chinchilla, MD       Or  . ondansetron (ZOFRAN) 8 mg in sodium chloride 0.9 % 50 mL IVPB  8 mg Intravenous Q8H PRN Nnaemeka Samson, MD      . pantoprazole (PROTONIX) EC tablet 40 mg  40 mg Oral Daily Heath Lark, MD      . senna (SENOKOT) tablet 8.6 mg  1 tablet Oral QHS Alizabeth Antonio, MD      . senna-docusate (Senokot-S) tablet 1 tablet  1 tablet Oral QHS PRN Heath Lark, MD        REVIEW OF SYSTEMS:   Constitutional: Denies fevers, chills or abnormal night sweats Eyes: Denies blurriness of vision, double vision or watery eyes Ears, nose, mouth, throat, and face: Denies mucositis or sore throat Cardiovascular: Denies palpitation, chest discomfort or lower extremity swelling Gastrointestinal:  Denies nausea, heartburn or change in bowel habits Skin: Denies abnormal skin rashes Lymphatics: Denies new lymphadenopathy or easy bruising  Neurological:Denies numbness, tingling or new weaknesses Behavioral/Psych: Mood is stable, no new changes  All other systems were reviewed with the patient and are negative.  PHYSICAL EXAMINATION: ECOG PERFORMANCE STATUS: 0 - Asymptomatic She is mildly obese GENERAL:alert, no distress and comfortable SKIN: skin color, texture, turgor are normal, no rashes or significant lesions EYES: normal, conjunctiva are pink and non-injected, sclera clear OROPHARYNX:no exudate, no erythema and lips, buccal mucosa, and tongue normal  NECK: supple, thyroid normal size, non-tender, without nodularity LYMPH:  no palpable lymphadenopathy in the cervical, axillary or inguinal LUNGS: clear to auscultation and percussion with normal breathing effort HEART: regular rate & rhythm and no murmurs and no lower extremity edema ABDOMEN:abdomen soft, non-tender and normal bowel sounds Musculoskeletal:no cyanosis of digits and no clubbing  PSYCH: alert & oriented x 3 with fluent speech NEURO: no focal motor/sensory deficits  LABORATORY DATA:  I have reviewed the data as listed Recent Results (from the past 2160 hour(s))  Basic metabolic panel     Status: Abnormal   Collection Time: 12/09/15  8:51 AM  Result Value Ref Range   Sodium 139 135 - 145 mmol/L   Potassium 3.1 (L) 3.5 - 5.1 mmol/L   Chloride 104 101 - 111 mmol/L   CO2 24 22 - 32 mmol/L    Glucose, Bld 128 (H) 65 - 99 mg/dL   BUN 11 6 - 20 mg/dL   Creatinine, Ser 1.20 (H) 0.44 - 1.00 mg/dL   Calcium 10.1 8.9 - 10.3 mg/dL   GFR calc non Af Amer 43 (L) >60 mL/min   GFR calc Af Amer 50 (L) >60 mL/min    Comment: (NOTE) The eGFR has been calculated using the CKD EPI equation. This calculation has not been validated in all clinical situations. eGFR's persistently <60 mL/min signify possible Chronic Kidney Disease.    Anion gap 11 5 - 15  CBC with Differential     Status: Abnormal   Collection Time: 12/09/15  8:51 AM  Result Value Ref Range   WBC 9.7 4.0 - 10.5 K/uL   RBC 4.59 3.87 - 5.11 MIL/uL   Hemoglobin 11.4 (L) 12.0 - 15.0 g/dL   HCT 34.3 (L) 36.0 - 46.0 %   MCV 74.7 (L) 78.0 - 100.0 fL   MCH 24.8 (L) 26.0 - 34.0 pg   MCHC 33.2 30.0 - 36.0 g/dL   RDW 18.4 (H) 11.5 - 15.5 %   Platelets 57 (L) 150 - 400 K/uL    Comment: PLATELET COUNT CONFIRMED BY SMEAR   Neutrophils Relative % 85 %   Neutro Abs 8.2 (H) 1.7 - 7.7 K/uL   Lymphocytes Relative 11 %   Lymphs Abs 1.0 0.7 - 4.0 K/uL   Monocytes Relative 4 %   Monocytes Absolute 0.4 0.1 - 1.0 K/uL   Eosinophils Relative 0 %   Eosinophils Absolute 0.0 0.0 - 0.7 K/uL   Basophils Relative 0 %   Basophils Absolute 0.0 0.0 - 0.1 K/uL  Ferritin     Status: None   Collection Time: 01/26/16 10:07 AM  Result Value Ref Range   Ferritin 60 9 - 269 ng/ml  CBC & Diff and Retic     Status: Abnormal   Collection Time: 01/26/16 10:08 AM  Result Value Ref Range   WBC 5.7 3.9 - 10.3 10e3/uL   NEUT# 3.4 1.5 - 6.5 10e3/uL   HGB 11.1 (L) 11.6 - 15.9 g/dL   HCT 33.5 (L) 34.8 - 46.6 %   Platelets 9 Large &  giant platelets (LL) 145 - 400 10e3/uL   MCV 77.4 (L) 79.5 - 101.0 fL   MCH 25.6 25.1 - 34.0 pg   MCHC 33.1 31.5 - 36.0 g/dL   RBC 4.33 3.70 - 5.45 10e6/uL   RDW 20.7 (H) 11.2 - 14.5 %   lymph# 1.9 0.9 - 3.3 10e3/uL   MONO# 0.3 0.1 - 0.9 10e3/uL   Eosinophils Absolute 0.0 0.0 - 0.5 10e3/uL   Basophils Absolute 0.0 0.0 - 0.1  10e3/uL   NEUT% 60.5 38.4 - 76.8 %   LYMPH% 33.7 14.0 - 49.7 %   MONO% 5.1 0.0 - 14.0 %   EOS% 0.5 0.0 - 7.0 %   BASO% 0.2 0.0 - 2.0 %   Retic % 1.24 0.70 - 2.10 %   Retic Ct Abs 53.69 33.70 - 90.70 10e3/uL   Immature Retic Fract 6.40 1.60 - 10.00 %    RADIOGRAPHIC STUDIES: I have personally reviewed the radiological images as listed and agreed with the findings in the report. Ct Chest High Resolution  01/26/2016  CLINICAL DATA:  75 year old female with history of bronchiectasis. Recent bronchitis 3 weeks ago. Status post antibiotic therapy. EXAM: CT CHEST WITHOUT CONTRAST TECHNIQUE: Multidetector CT imaging of the chest was performed following the standard protocol without intravenous contrast. High resolution imaging of the lungs, as well as inspiratory and expiratory imaging, was performed. COMPARISON:  Chest CT 12/19/2010. FINDINGS: Mediastinum/Lymph Nodes: Heart size is normal. There is no significant pericardial fluid, thickening or pericardial calcification. There is atherosclerosis of the thoracic aorta, the great vessels of the mediastinum and the coronary arteries, including calcified atherosclerotic plaque in the left main, left anterior descending, left circumflex and right coronary arteries. No pathologically enlarged mediastinal or hilar lymph nodes. Please note that accurate exclusion of hilar adenopathy is limited on noncontrast CT scans. Esophagus is unremarkable in appearance. No axillary lymphadenopathy. Lungs/Pleura: Tiny calcified granuloma in the dependent portion of the left lower lobe. No other suspicious appearing pulmonary nodules or masses. No acute consolidative airspace disease. No pleural effusions. High-resolution images demonstrate some mild lower lung predominant cylindrical bronchiectasis, most severe in the left lower lobe where there is also some thickening of the peribronchovascular interstitium cut of focal architectural distortion and volume loss, likely sequela  of prior infection. There are no more generalized areas of ground-glass attenuation, subpleural reticulation or honeycombing to suggest underlying interstitial lung disease. Inspiratory and expiratory imaging is unremarkable. Upper abdomen: In segment 4A of the liver there is again a well-circumscribed 2.0 cm low-attenuation lesion which is incompletely characterized on today's noncontrast CT examination, but is similar to the prior study, statistically likely a small cyst. Small calcified granuloma in the central aspect of the liver. Calcification in the upper pole of the right kidney incompletely visualized but measuring at least 3 mm, potentially a nonobstructive calculus exophytic incompletely visualized low-attenuation lesion associated with the upper pole of the left kidney measuring at least 4.4 cm in diameter is incompletely characterized, but likely a cyst. Atherosclerosis. Musculoskeletal: There are no aggressive appearing lytic or blastic lesions noted in the visualized portions of the skeleton. IMPRESSION: 1. Lower lobe predominant cylindrical bronchiectasis and post infectious or inflammatory scarring, most severe in the left lower lobe, as above. 2. No other findings to suggest interstitial lung disease. 3. No consolidative airspace disease to suggest active acute infection. 23. Sequela of old granulomatous disease, as above. 5. Atherosclerosis, including left main and 3 vessel coronary artery disease. Assessment for potential risk factor modification, dietary  therapy or pharmacologic therapy may be warranted, if clinically indicated. 6. 3 mm calcification in the upper of the right kidney may represent a nonobstructive calculus. 7. Additional incidental findings, as above. Electronically Signed   By: Vinnie Langton M.D.   On: 01/26/2016 08:38    ASSESSMENT & PLAN  Recurrent ITP This is likely exacerbated by recent infection She has completed a course of Augmentin. I will start her on treatment  with IVIG 1 g/kg daily 7 days along with high-dose dexamethasone daily for 4 days I want her to be admitted due to severe risk of bleeding with low platelet count We will monitor her platelet count daily If her platelet count is greater than 50,000, she can be discharged  Bronchiectasis Recurrent hemoptysis She has mild, scant hemoptysis when she cough Her platelet count is low and she is at high risk of life-threatening bleeding I will admit her to the hospital and start her on IVIG and steroids as soon as possible If she has further bleeding, we will give her platelet transfusion but I will hold unless it is persistently low at less than 10,000 tomorrow  DVT prophylaxis Contraindicated due to low platelet count  Chronic anemia This is likely anemia of chronic disease. No transfusion is needed  Discharge planning She needs to be in the hospital due to high risk of bleeding. The risks of not being admitted is risk of death I will discharge her if platelet count is greater than 50,000   Aishia Barkey, MD 01/26/2016

## 2016-01-26 NOTE — Progress Notes (Signed)
  This encounter was created in error - please disregard. Patient is admitted

## 2016-01-27 LAB — CBC
HCT: 31.3 % — ABNORMAL LOW (ref 36.0–46.0)
HEMOGLOBIN: 10.7 g/dL — AB (ref 12.0–15.0)
MCH: 25.6 pg — ABNORMAL LOW (ref 26.0–34.0)
MCHC: 34.2 g/dL (ref 30.0–36.0)
MCV: 74.9 fL — ABNORMAL LOW (ref 78.0–100.0)
Platelets: 25 10*3/uL — CL (ref 150–400)
RBC: 4.18 MIL/uL (ref 3.87–5.11)
RDW: 20.8 % — ABNORMAL HIGH (ref 11.5–15.5)
WBC: 5 10*3/uL (ref 4.0–10.5)

## 2016-01-27 MED ORDER — AMLODIPINE BESYLATE 10 MG PO TABS
10.0000 mg | ORAL_TABLET | Freq: Two times a day (BID) | ORAL | Status: DC
  Administered 2016-01-27 – 2016-01-28 (×3): 10 mg via ORAL
  Filled 2016-01-27 (×5): qty 1

## 2016-01-27 NOTE — Progress Notes (Signed)
[name] FantasiaGloria Gould   DOB:06/02/1941   ZO#:[id number]#:[id number]    Subjective: She feels well. Noted bruising. No further hemoptysis. No complications from treatment  Objective:  Filed Vitals:   01/27/16 1517 01/27/16 1559  BP: 128/72 130/77  Pulse: 94 90  Temp: 98.4 F (36.9 C) 98.4 F (36.9 C)  Resp: 18      Intake/Output Summary (Last 24 hours) at 01/27/16 1604 Last data filed at 01/27/16 0200  Gross per 24 hour  Intake    400 ml  Output   1600 ml  Net  -1200 ml    GENERAL:alert, no distress and comfortable SKIN: skin color, texture, turgor are normal, no rashes or significant lesions. Noted minor skin bruising EYES: normal, Conjunctiva are pink and non-injected, sclera clear Musculoskeletal:no cyanosis of digits and no clubbing  NEURO: alert & oriented x 3 with fluent speech, no focal motor/sensory deficits   Labs:  Lab Results  Component Value Date   WBC 5.0 01/27/2016   HGB 10.7* 01/27/2016   HCT 31.3* 01/27/2016   MCV 74.9* 01/27/2016   PLT 25* 01/27/2016   NEUTROABS 3.4 01/26/2016    Lab Results  Component Value Date   NA 139 12/09/2015   K 3.1* 12/09/2015   CL 104 12/09/2015   CO2 24 12/09/2015    Assessment & Plan:   Recurrent ITP This is likely exacerbated by recent infection She has completed a course of Augmentin. I will start her on treatment with IVIG 1 g/kg daily 2 days along with high-dose dexamethasone daily for 4 days Today is day 2 of treatment We will monitor her platelet count daily If her platelet count is greater than 50,000, she can be discharged  Bronchiectasis Recurrent hemoptysis, resolved This has improved on IVIG and dexamethasone.  DVT prophylaxis Contraindicated due to low platelet count  Chronic anemia This is likely anemia of chronic disease. No transfusion is needed  Discharge planning She needs to be in the hospital due to high risk of bleeding. The risks of not being admitted is risk of death I will discharge her if platelet  count is greater than 50,000, hopefully tomorrow   [facility] CenterGORSUCH, Jeyren Danowski, MD 01/27/2016  4:04 PM

## 2016-01-28 ENCOUNTER — Other Ambulatory Visit: Payer: Self-pay | Admitting: Hematology and Oncology

## 2016-01-28 DIAGNOSIS — D693 Immune thrombocytopenic purpura: Secondary | ICD-10-CM

## 2016-01-28 LAB — CBC
HEMATOCRIT: 30.9 % — AB (ref 36.0–46.0)
HEMOGLOBIN: 10.1 g/dL — AB (ref 12.0–15.0)
MCH: 25.5 pg — ABNORMAL LOW (ref 26.0–34.0)
MCHC: 32.7 g/dL (ref 30.0–36.0)
MCV: 78 fL (ref 78.0–100.0)
PLATELETS: 77 10*3/uL — AB (ref 150–400)
RBC: 3.96 MIL/uL (ref 3.87–5.11)
RDW: 21.6 % — ABNORMAL HIGH (ref 11.5–15.5)
WBC: 6 10*3/uL (ref 4.0–10.5)

## 2016-01-28 NOTE — Plan of Care (Signed)
Problem: Pain Managment: Goal: General experience of comfort will improve Outcome: Completed/Met Date Met:  01/28/16 Pt feels she is ready to return home, NO complaints of discomfort  Problem: Physical Regulation: Goal: Ability to maintain clinical measurements within normal limits will improve Outcome: Progressing Platelets increased from 25 to 77 within the last 24 hours Goal: Will remain free from infection Outcome: Completed/Met Date Met:  01/28/16 Pt shows no signs of infection

## 2016-01-28 NOTE — Progress Notes (Signed)
Utilization Review Completed.[name] T3/19/2017  

## 2016-01-28 NOTE — Plan of Care (Signed)
Problem: Physical Regulation: Goal: Ability to maintain clinical measurements within normal limits will improve Outcome: Progressing Platelets have increased from 9 on admission to 77 this morning

## 2016-01-28 NOTE — Discharge Summary (Signed)
Physician Discharge Summary  Patient ID: [name] MRN: [id number] DOB/AGE: 1941/09/23 75 y.o.  Admit date: 01/26/2016 Discharge date: 01/28/2016  Primary Care Physician:  [name], MD   Discharge Diagnoses:    Present on Admission:  . ITP (idiopathic thrombocytopenic purpura) . Anemia in chronic illness  Discharge Medications:    Medication List    TAKE these medications        acetaminophen 500 MG tablet  Commonly known as:  TYLENOL  Take 1 tablet (500 mg total) by mouth every 6 (six) hours as needed.     amLODipine 10 MG tablet  Commonly known as:  NORVASC  Take 10 mg by mouth 2 (two) times daily.     aspirin 81 MG tablet  Take 81 mg by mouth daily.     BUTRANS 5 MCG/HR Ptwk patch  Generic drug:  buprenorphine  Place 1 patch onto the skin once a week.     cholecalciferol 1000 units tablet  Commonly known as:  VITAMIN D  Take 1,000 Units by mouth daily.     citalopram 20 MG tablet  Commonly known as:  CELEXA  Take 20 mg by mouth daily.     levothyroxine 100 MCG tablet  Commonly known as:  SYNTHROID, LEVOTHROID  Take 100 mcg by mouth daily.     loratadine 10 MG tablet  Commonly known as:  CLARITIN  TAKE 1 TABLET (10 MG TOTAL) BY MOUTH DAILY.     losartan 100 MG tablet  Commonly known as:  COZAAR  Take 100 mg by mouth daily.     pantoprazole 40 MG tablet  Commonly known as:  PROTONIX  TAKE 1 TABLET (40 MG TOTAL) BY MOUTH DAILY.     senna 8.6 MG tablet  Commonly known as:  SENOKOT  Take 1 tablet by mouth as needed for constipation.     tiZANidine 2 MG tablet  Commonly known as:  ZANAFLEX  Take 2 mg by mouth 2 (two) times daily as needed for muscle spasms.         Disposition and Follow-up:   Significant Diagnostic Studies:  Ct Chest High Resolution  01/26/2016  CLINICAL DATA:  75 year old female with history of bronchiectasis. Recent bronchitis 3 weeks ago. Status post antibiotic therapy. EXAM: CT CHEST WITHOUT CONTRAST  TECHNIQUE: Multidetector CT imaging of the chest was performed following the standard protocol without intravenous contrast. High resolution imaging of the lungs, as well as inspiratory and expiratory imaging, was performed. COMPARISON:  Chest CT 12/19/2010. FINDINGS: Mediastinum/Lymph Nodes: Heart size is normal. There is no significant pericardial fluid, thickening or pericardial calcification. There is atherosclerosis of the thoracic aorta, the great vessels of the mediastinum and the coronary arteries, including calcified atherosclerotic plaque in the left main, left anterior descending, left circumflex and right coronary arteries. No pathologically enlarged mediastinal or hilar lymph nodes. Please note that accurate exclusion of hilar adenopathy is limited on noncontrast CT scans. Esophagus is unremarkable in appearance. No axillary lymphadenopathy. Lungs/Pleura: Tiny calcified granuloma in the dependent portion of the left lower lobe. No other suspicious appearing pulmonary nodules or masses. No acute consolidative airspace disease. No pleural effusions. High-resolution images demonstrate some mild lower lung predominant cylindrical bronchiectasis, most severe in the left lower lobe where there is also some thickening of the peribronchovascular interstitium cut of focal architectural distortion and volume loss, likely sequela of prior infection. There are no more generalized areas of ground-glass attenuation, subpleural reticulation or honeycombing to suggest underlying interstitial lung disease.  Inspiratory and expiratory imaging is unremarkable. Upper abdomen: In segment 4A of the liver there is again a well-circumscribed 2.0 cm low-attenuation lesion which is incompletely characterized on today's noncontrast CT examination, but is similar to the prior study, statistically likely a small cyst. Small calcified granuloma in the central aspect of the liver. Calcification in the upper pole of the right kidney  incompletely visualized but measuring at least 3 mm, potentially a nonobstructive calculus exophytic incompletely visualized low-attenuation lesion associated with the upper pole of the left kidney measuring at least 4.4 cm in diameter is incompletely characterized, but likely a cyst. Atherosclerosis. Musculoskeletal: There are no aggressive appearing lytic or blastic lesions noted in the visualized portions of the skeleton. IMPRESSION: 1. Lower lobe predominant cylindrical bronchiectasis and post infectious or inflammatory scarring, most severe in the left lower lobe, as above. 2. No other findings to suggest interstitial lung disease. 3. No consolidative airspace disease to suggest active acute infection. 4. Sequela of old granulomatous disease, as above. 5. Atherosclerosis, including left main and 3 vessel coronary artery disease. Assessment for potential risk factor modification, dietary therapy or pharmacologic therapy may be warranted, if clinically indicated. 6. 3 mm calcification in the upper of the right kidney may represent a nonobstructive calculus. 7. Additional incidental findings, as above. Electronically Signed   By: Trudie Reedaniel  Entrikin M.D.   On: 01/26/2016 08:38    Discharge Laboratory Values: Lab Results  Component Value Date   WBC 6.0 01/28/2016   HGB 10.1* 01/28/2016   HCT 30.9* 01/28/2016   MCV 78.0 01/28/2016   PLT 77* 01/28/2016   Lab Results  Component Value Date   NA 139 12/09/2015   K 3.1* 12/09/2015   CL 104 12/09/2015   CO2 24 12/09/2015    Brief H and P: For complete details please refer to admission H and P, but in brief, [name] was admitted to the hospital on 01/26/2016 due to relapse, severe ITP.  Physical Exam at Discharge: BP 165/79 mmHg  Pulse 78  Temp(Src) 98.8 F (37.1 C) (Oral)  Resp 16  Ht 5\' 5"  (1.651 m)  Wt 203 lb 12.8 oz (92.443 kg)  BMI 33.91 kg/m2  SpO2 99%  She is mildly obese GENERAL:alert, no distress and comfortable SKIN: skin  color, texture, turgor are normal, no rashes or significant lesions EYES: normal, conjunctiva are pink and non-injected, sclera clear OROPHARYNX:no exudate, no erythema and lips, buccal mucosa, and tongue normal  NECK: supple, thyroid normal size, non-tender, without nodularity LYMPH: no palpable lymphadenopathy in the cervical, axillary or inguinal LUNGS: clear to auscultation and percussion with normal breathing effort HEART: regular rate & rhythm and no murmurs and no lower extremity edema ABDOMEN:abdomen soft, non-tender and normal bowel sounds Musculoskeletal:no cyanosis of digits and no clubbing  PSYCH: alert & oriented x 3 with fluent speech NEURO: no focal motor/sensory deficits Hospital Course:  Active Problems:   ITP (idiopathic thrombocytopenic purpura)   Anemia in chronic illness   BRONCHIECTASIS  Recurrent ITP This is likely exacerbated by recent infection She has completed a course of Augmentin. I will start her on treatment with IVIG 1 g/kg daily 2 days along with high-dose dexamethasone daily. She responded to treatment very well and is stable for discharge  Bronchiectasis Recurrent hemoptysis, resolved This has improved on IVIG and dexamethasone.  DVT prophylaxis Contraindicated due to low platelet count  Chronic anemia This is likely anemia of chronic disease. No transfusion is needed  Discharge planning She will be discharged home  today. Follow-up in my office on 02/05/2016  Diet:  Regular  Activity:  As tolerated  Condition at Discharge:   Stable  Signed: Dr. Artis Delay [phone]  01/28/2016, 1:23 PM

## 2016-01-28 NOTE — Progress Notes (Signed)
Assessment unchanged. Pt given discharge instructions and verbalized understanding. Pt signed for received home medications. Social worker notified to discuss home care needs.

## 2016-02-01 DIAGNOSIS — N183 Chronic kidney disease, stage 3 (moderate): Secondary | ICD-10-CM | POA: Diagnosis not present

## 2016-02-01 DIAGNOSIS — N189 Chronic kidney disease, unspecified: Secondary | ICD-10-CM | POA: Diagnosis not present

## 2016-02-01 DIAGNOSIS — N2581 Secondary hyperparathyroidism of renal origin: Secondary | ICD-10-CM | POA: Diagnosis not present

## 2016-02-01 DIAGNOSIS — E785 Hyperlipidemia, unspecified: Secondary | ICD-10-CM | POA: Diagnosis not present

## 2016-02-01 DIAGNOSIS — E039 Hypothyroidism, unspecified: Secondary | ICD-10-CM | POA: Diagnosis not present

## 2016-02-01 DIAGNOSIS — D631 Anemia in chronic kidney disease: Secondary | ICD-10-CM | POA: Diagnosis not present

## 2016-02-05 ENCOUNTER — Other Ambulatory Visit (HOSPITAL_BASED_OUTPATIENT_CLINIC_OR_DEPARTMENT_OTHER): Payer: Medicare Other

## 2016-02-05 ENCOUNTER — Telehealth: Payer: Self-pay | Admitting: Hematology and Oncology

## 2016-02-05 ENCOUNTER — Encounter: Payer: Self-pay | Admitting: Hematology and Oncology

## 2016-02-05 ENCOUNTER — Ambulatory Visit (HOSPITAL_BASED_OUTPATIENT_CLINIC_OR_DEPARTMENT_OTHER): Payer: Medicare Other | Admitting: Hematology and Oncology

## 2016-02-05 VITALS — BP 158/66 | HR 85 | Temp 98.2°F | Resp 18 | Ht 65.0 in | Wt 200.7 lb

## 2016-02-05 DIAGNOSIS — D649 Anemia, unspecified: Secondary | ICD-10-CM | POA: Diagnosis not present

## 2016-02-05 DIAGNOSIS — D638 Anemia in other chronic diseases classified elsewhere: Secondary | ICD-10-CM

## 2016-02-05 DIAGNOSIS — I10 Essential (primary) hypertension: Secondary | ICD-10-CM

## 2016-02-05 DIAGNOSIS — D693 Immune thrombocytopenic purpura: Secondary | ICD-10-CM | POA: Diagnosis not present

## 2016-02-05 LAB — COMPREHENSIVE METABOLIC PANEL
ALBUMIN: 3 g/dL — AB (ref 3.5–5.0)
ALT: 25 U/L (ref 0–55)
ANION GAP: 6 meq/L (ref 3–11)
AST: 32 U/L (ref 5–34)
Alkaline Phosphatase: 62 U/L (ref 40–150)
BILIRUBIN TOTAL: 0.42 mg/dL (ref 0.20–1.20)
BUN: 18.3 mg/dL (ref 7.0–26.0)
CO2: 28 meq/L (ref 22–29)
CREATININE: 1.2 mg/dL — AB (ref 0.6–1.1)
Calcium: 9.7 mg/dL (ref 8.4–10.4)
Chloride: 107 mEq/L (ref 98–109)
EGFR: 53 mL/min/{1.73_m2} — ABNORMAL LOW (ref >90)
GLUCOSE: 79 mg/dL (ref 70–140)
Potassium: 3.7 mEq/L (ref 3.5–5.1)
Sodium: 141 mEq/L (ref 136–145)
TOTAL PROTEIN: 8.1 g/dL (ref 6.4–8.3)

## 2016-02-05 LAB — CBC WITH DIFFERENTIAL/PLATELET
BASO%: 0.8 % (ref 0.0–2.0)
Basophils Absolute: 0 10*3/uL (ref 0.0–0.1)
EOS%: 0.3 % (ref 0.0–7.0)
Eosinophils Absolute: 0 10*3/uL (ref 0.0–0.5)
HEMATOCRIT: 32.3 % — AB (ref 34.8–46.6)
HEMOGLOBIN: 10.7 g/dL — AB (ref 11.6–15.9)
LYMPH%: 31.7 % (ref 14.0–49.7)
MCH: 26.1 pg (ref 25.1–34.0)
MCHC: 33.1 g/dL (ref 31.5–36.0)
MCV: 78.8 fL — ABNORMAL LOW (ref 79.5–101.0)
MONO#: 0.7 10*3/uL (ref 0.1–0.9)
MONO%: 11.9 % (ref 0.0–14.0)
NEUT%: 55.3 % (ref 38.4–76.8)
NEUTROS ABS: 3.2 10*3/uL (ref 1.5–6.5)
PLATELETS: 231 10*3/uL (ref 145–400)
RBC: 4.09 10*6/uL (ref 3.70–5.45)
RDW: 22.2 % — ABNORMAL HIGH (ref 11.2–14.5)
WBC: 5.9 10*3/uL (ref 3.9–10.3)
lymph#: 1.9 10*3/uL (ref 0.9–3.3)

## 2016-02-05 NOTE — Assessment & Plan Note (Signed)
I recommend she follows with primary care provider for blood pressure medication adjustment. 

## 2016-02-05 NOTE — Assessment & Plan Note (Signed)
This could be due to anemia chronic disease. Clinically, she is not symptomatic.   

## 2016-02-05 NOTE — Assessment & Plan Note (Signed)
She has responded well to recent treatment with IVIG and high dose dexamethasone I plan to recheck cbc weekly and see her back in a month

## 2016-02-05 NOTE — Progress Notes (Signed)
[name] OFFICE PROGRESS NOTE  [name] Gravel, MD SUMMARY OF HEMATOLOGIC HISTORY:  #1 2012, she has thrombocytopenia that resolved spontaneously. The patient also recalled taking prednisone in the past for possible diagnosis of lupus #2 04/12/2013 she had mild microcytic anemia #3 07/09/2013 she has persistent mild microcytic anemia and thrombocytopenia with a platelet count of 51,000 #4 08/02/2013 I saw the patient and recommended a bone marrow aspirate and biopsy #5 08/06/2013 I perform bone marrow aspirate and biopsy. The patient received 1 unit of platelets for severe thrombocytopenia with a platelet count of 5000 #6 08/11/13: she is started on prednisone 80 mg daily #7 08/19/13: platelet count improves to 222. We appreciated steroid taper #8 09/02/2013 her platelet count dropped to 90,000. We increased prednisone back to 60 mg a day #9 09/16/13: dose of prednisone was reduced back to 40 mg #10 09/30/13: prednisone dose was reduced to 20 mg #11 10/14/13: prednisone dose was reduced to 10 mg #12 10/21/13: Prednisone dose was reduced to 5 mg #13 11/01/13: Prednisone dose was reduced to 2.5 mg #14 11/15/2013: Prednisone dose was reduced to 2.5 mg every other day #15 12/27/2013, prednisone is discontinued The patient was readmitted to the hospital from 01/26/2016 to 01/28/2016 for relapsed ITP. She received 2 doses of IVIG and high-dose dexamethasone 3 days INTERVAL HISTORY: [name] 75 y.o. female returns for further follow-up. She felt well. She denies further cough or hemoptysis. No recent fever or chills. The patient denies any recent signs or symptoms of bleeding such as spontaneous epistaxis, hematuria or hematochezia.   I have reviewed the past medical history, past surgical history, social history and family history with the patient and they are unchanged from previous note.  ALLERGIES:  is allergic to codeine; fish allergy; lactose intolerance (gi); oxycodone;  penicillins; sulfonamide derivatives; tramadol hcl; and triamcinolone.  MEDICATIONS:  Current Outpatient Prescriptions  Medication Sig Dispense Refill  . acetaminophen (TYLENOL) 500 MG tablet Take 1 tablet (500 mg total) by mouth every 6 (six) hours as needed. (Patient taking differently: Take 500-1,000 mg by mouth every 6 (six) hours as needed for moderate pain. ) 30 tablet 0  . amLODipine (NORVASC) 10 MG tablet Take 10 mg by mouth 2 (two) times daily.  4  . aspirin 81 MG tablet Take 81 mg by mouth daily.      Marland Kitchen BUTRANS 5 MCG/HR PTWK patch Place 1 patch onto the skin once a week.    . cholecalciferol (VITAMIN D) 1000 UNITS tablet Take 1,000 Units by mouth daily.    . citalopram (CELEXA) 20 MG tablet Take 20 mg by mouth daily.    Marland Kitchen levothyroxine (SYNTHROID, LEVOTHROID) 100 MCG tablet Take 100 mcg by mouth daily.      Marland Kitchen loratadine (CLARITIN) 10 MG tablet TAKE 1 TABLET (10 MG TOTAL) BY MOUTH DAILY. 30 tablet 5  . losartan (COZAAR) 100 MG tablet Take 100 mg by mouth daily.    . pantoprazole (PROTONIX) 40 MG tablet TAKE 1 TABLET (40 MG TOTAL) BY MOUTH DAILY. 90 tablet 1  . senna (SENOKOT) 8.6 MG tablet Take 1 tablet by mouth as needed for constipation.     Marland Kitchen tiZANidine (ZANAFLEX) 2 MG tablet Take 2 mg by mouth 2 (two) times daily as needed for muscle spasms.   2   No current facility-administered medications for this visit.     REVIEW OF SYSTEMS:   Constitutional: Denies fevers, chills or night sweats Eyes: Denies blurriness of vision Ears, nose, mouth,  throat, and face: Denies mucositis or sore throat Respiratory: Denies cough, dyspnea or wheezes Cardiovascular: Denies palpitation, chest discomfort or lower extremity swelling Gastrointestinal:  Denies nausea, heartburn or change in bowel habits Skin: Denies abnormal skin rashes Lymphatics: Denies new lymphadenopathy or easy bruising Neurological:Denies numbness, tingling or new weaknesses Behavioral/Psych: Mood is stable, no new changes   All other systems were reviewed with the patient and are negative.  PHYSICAL EXAMINATION: ECOG PERFORMANCE STATUS: 0 - Asymptomatic  Filed Vitals:   02/05/16 1136  BP: 158/66  Pulse: 85  Temp: 98.2 F (36.8 C)  Resp: 18   Filed Weights   02/05/16 1136  Weight: 200 lb 11.2 oz (91.037 kg)    GENERAL:alert, no distress and comfortable SKIN: skin color, texture, turgor are normal, no rashes or significant lesions EYES: normal, Conjunctiva are pink and non-injected, sclera clear OROPHARYNX:no exudate, no erythema and lips, buccal mucosa, and tongue normal  Musculoskeletal:no cyanosis of digits and no clubbing  NEURO: alert & oriented x 3 with fluent speech, no focal motor/sensory deficits  LABORATORY DATA:  I have reviewed the data as listed Results for orders placed or performed in visit on 02/05/16 (from the past 48 hour(s))  CBC with Differential/Platelet     Status: Abnormal   Collection Time: 02/05/16 11:24 AM  Result Value Ref Range   WBC 5.9 3.9 - 10.3 10e3/uL   NEUT# 3.2 1.5 - 6.5 10e3/uL   HGB 10.7 (L) 11.6 - 15.9 g/dL   HCT 32.3 (L) 34.8 - 46.6 %   Platelets 231 145 - 400 10e3/uL   MCV 78.8 (L) 79.5 - 101.0 fL   MCH 26.1 25.1 - 34.0 pg   MCHC 33.1 31.5 - 36.0 g/dL   RBC 4.09 3.70 - 5.45 10e6/uL   RDW 22.2 (H) 11.2 - 14.5 %   lymph# 1.9 0.9 - 3.3 10e3/uL   MONO# 0.7 0.1 - 0.9 10e3/uL   Eosinophils Absolute 0.0 0.0 - 0.5 10e3/uL   Basophils Absolute 0.0 0.0 - 0.1 10e3/uL   NEUT% 55.3 38.4 - 76.8 %   LYMPH% 31.7 14.0 - 49.7 %   MONO% 11.9 0.0 - 14.0 %   EOS% 0.3 0.0 - 7.0 %   BASO% 0.8 0.0 - 2.0 %  Comprehensive metabolic panel     Status: Abnormal   Collection Time: 02/05/16 11:24 AM  Result Value Ref Range   Sodium 141 136 - 145 mEq/L   Potassium 3.7 3.5 - 5.1 mEq/L   Chloride 107 98 - 109 mEq/L   CO2 28 22 - 29 mEq/L   Glucose 79 70 - 140 mg/dl    Comment: Glucose reference range is for nonfasting patients. Fasting glucose reference range is 70- 100.    BUN 18.3 7.0 - 26.0 mg/dL   Creatinine 1.2 (H) 0.6 - 1.1 mg/dL   Total Bilirubin 0.42 0.20 - 1.20 mg/dL   Alkaline Phosphatase 62 40 - 150 U/L   AST 32 5 - 34 U/L   ALT 25 0 - 55 U/L   Total Protein 8.1 6.4 - 8.3 g/dL   Albumin 3.0 (L) 3.5 - 5.0 g/dL   Calcium 9.7 8.4 - 10.4 mg/dL   Anion Gap 6 3 - 11 mEq/L   EGFR 53 (L) >90 ml/min/1.73 m2    Comment: eGFR is calculated using the CKD-EPI Creatinine Equation (2009)    Lab Results  Component Value Date   WBC 5.9 02/05/2016   HGB 10.7* 02/05/2016   HCT 32.3* 02/05/2016  MCV 78.8* 02/05/2016   PLT 231 02/05/2016    ASSESSMENT & PLAN:  ITP (idiopathic thrombocytopenic purpura) She has responded well to recent treatment with IVIG and high dose dexamethasone I plan to recheck cbc weekly and see her back in a month  Anemia in chronic illness This could be due to anemia chronic disease. Clinically, she is not symptomatic.    Essential hypertension I recommend she follows with primary care provider for blood pressure medication adjustment.      All questions were answered. The patient knows to call the clinic with any problems, questions or concerns. No barriers to learning was detected.  I spent 15 minutes counseling the patient face to face. The total time spent in the appointment was 20 minutes and more than 50% was on counseling.     [facility], Adaia Matthies, MD 3/27/201712:24 PM

## 2016-02-05 NOTE — Telephone Encounter (Signed)
per pof to sch pt appt-gave pt copy of avs °

## 2016-02-12 ENCOUNTER — Other Ambulatory Visit (HOSPITAL_BASED_OUTPATIENT_CLINIC_OR_DEPARTMENT_OTHER): Payer: Medicare Other

## 2016-02-12 ENCOUNTER — Telehealth: Payer: Self-pay | Admitting: *Deleted

## 2016-02-12 DIAGNOSIS — D693 Immune thrombocytopenic purpura: Secondary | ICD-10-CM

## 2016-02-12 LAB — CBC WITH DIFFERENTIAL/PLATELET
BASO%: 0.6 % (ref 0.0–2.0)
Basophils Absolute: 0 10*3/uL (ref 0.0–0.1)
EOS%: 0.3 % (ref 0.0–7.0)
Eosinophils Absolute: 0 10*3/uL (ref 0.0–0.5)
HCT: 33.8 % — ABNORMAL LOW (ref 34.8–46.6)
HEMOGLOBIN: 10.9 g/dL — AB (ref 11.6–15.9)
LYMPH%: 31.5 % (ref 14.0–49.7)
MCH: 25.9 pg (ref 25.1–34.0)
MCHC: 32.2 g/dL (ref 31.5–36.0)
MCV: 80.7 fL (ref 79.5–101.0)
MONO#: 0.4 10*3/uL (ref 0.1–0.9)
MONO%: 6.6 % (ref 0.0–14.0)
NEUT%: 61 % (ref 38.4–76.8)
NEUTROS ABS: 3.7 10*3/uL (ref 1.5–6.5)
Platelets: 131 10*3/uL — ABNORMAL LOW (ref 145–400)
RBC: 4.19 10*6/uL (ref 3.70–5.45)
RDW: 21.8 % — AB (ref 11.2–14.5)
WBC: 6.1 10*3/uL (ref 3.9–10.3)
lymph#: 1.9 10*3/uL (ref 0.9–3.3)

## 2016-02-12 NOTE — Telephone Encounter (Signed)
-----   Message from Artis DelayNi Gorsuch, MD sent at 02/12/2016  3:00 PM EDT ----- Regarding: cbc Platelet a little lower but still over 100. I recommend just watching it for now ----- Message -----    From: [facility]    Sent: 02/12/2016   2:38 PM      To: Artis DelayNi Gorsuch, MD

## 2016-02-13 DIAGNOSIS — M533 Sacrococcygeal disorders, not elsewhere classified: Secondary | ICD-10-CM | POA: Diagnosis not present

## 2016-02-13 DIAGNOSIS — G894 Chronic pain syndrome: Secondary | ICD-10-CM | POA: Diagnosis not present

## 2016-02-13 DIAGNOSIS — Z79899 Other long term (current) drug therapy: Secondary | ICD-10-CM | POA: Diagnosis not present

## 2016-02-13 DIAGNOSIS — M47817 Spondylosis without myelopathy or radiculopathy, lumbosacral region: Secondary | ICD-10-CM | POA: Diagnosis not present

## 2016-02-13 DIAGNOSIS — E669 Obesity, unspecified: Secondary | ICD-10-CM | POA: Diagnosis not present

## 2016-02-13 DIAGNOSIS — Z79891 Long term (current) use of opiate analgesic: Secondary | ICD-10-CM | POA: Diagnosis not present

## 2016-02-13 DIAGNOSIS — M5137 Other intervertebral disc degeneration, lumbosacral region: Secondary | ICD-10-CM | POA: Diagnosis not present

## 2016-02-19 ENCOUNTER — Other Ambulatory Visit (HOSPITAL_BASED_OUTPATIENT_CLINIC_OR_DEPARTMENT_OTHER): Payer: Medicare Other

## 2016-02-19 DIAGNOSIS — D693 Immune thrombocytopenic purpura: Secondary | ICD-10-CM | POA: Diagnosis not present

## 2016-02-19 LAB — CBC WITH DIFFERENTIAL/PLATELET
BASO%: 0.4 % (ref 0.0–2.0)
BASOS ABS: 0 10*3/uL (ref 0.0–0.1)
EOS%: 0.3 % (ref 0.0–7.0)
Eosinophils Absolute: 0 10*3/uL (ref 0.0–0.5)
HCT: 33.8 % — ABNORMAL LOW (ref 34.8–46.6)
HEMOGLOBIN: 11 g/dL — AB (ref 11.6–15.9)
LYMPH%: 32.3 % (ref 14.0–49.7)
MCH: 26.3 pg (ref 25.1–34.0)
MCHC: 32.4 g/dL (ref 31.5–36.0)
MCV: 81.1 fL (ref 79.5–101.0)
MONO#: 0.5 10*3/uL (ref 0.1–0.9)
MONO%: 7.8 % (ref 0.0–14.0)
NEUT#: 4.1 10*3/uL (ref 1.5–6.5)
NEUT%: 59.2 % (ref 38.4–76.8)
Platelets: 111 10*3/uL — ABNORMAL LOW (ref 145–400)
RBC: 4.17 10*6/uL (ref 3.70–5.45)
RDW: 20.9 % — AB (ref 11.2–14.5)
WBC: 6.9 10*3/uL (ref 3.9–10.3)
lymph#: 2.2 10*3/uL (ref 0.9–3.3)

## 2016-02-20 ENCOUNTER — Telehealth: Payer: Self-pay | Admitting: *Deleted

## 2016-02-20 NOTE — Telephone Encounter (Signed)
Informed pt of Platelet count stable and keep lab appt next week as scheduled. She verbalized understanding.

## 2016-02-20 NOTE — Telephone Encounter (Signed)
-----   Message from Artis DelayNi Gorsuch, MD sent at 02/20/2016  7:31 AM EDT ----- Regarding: cbc stable pls let her know it is stable Continue close monitoring ----- Message -----    From: [facility]    Sent: 02/19/2016   2:36 PM      To: Artis DelayNi Gorsuch, MD

## 2016-02-26 ENCOUNTER — Other Ambulatory Visit (HOSPITAL_BASED_OUTPATIENT_CLINIC_OR_DEPARTMENT_OTHER): Payer: Medicare Other

## 2016-02-26 DIAGNOSIS — D693 Immune thrombocytopenic purpura: Secondary | ICD-10-CM

## 2016-02-26 LAB — CBC WITH DIFFERENTIAL/PLATELET
BASO%: 0.6 % (ref 0.0–2.0)
BASOS ABS: 0 10*3/uL (ref 0.0–0.1)
EOS ABS: 0 10*3/uL (ref 0.0–0.5)
EOS%: 0.4 % (ref 0.0–7.0)
HCT: 32.6 % — ABNORMAL LOW (ref 34.8–46.6)
HEMOGLOBIN: 10.6 g/dL — AB (ref 11.6–15.9)
LYMPH%: 27.6 % (ref 14.0–49.7)
MCH: 26.5 pg (ref 25.1–34.0)
MCHC: 32.7 g/dL (ref 31.5–36.0)
MCV: 80.9 fL (ref 79.5–101.0)
MONO#: 0.5 10*3/uL (ref 0.1–0.9)
MONO%: 7.6 % (ref 0.0–14.0)
NEUT%: 63.8 % (ref 38.4–76.8)
NEUTROS ABS: 4.3 10*3/uL (ref 1.5–6.5)
Platelets: 186 10*3/uL (ref 145–400)
RBC: 4.02 10*6/uL (ref 3.70–5.45)
RDW: 19.7 % — AB (ref 11.2–14.5)
WBC: 6.7 10*3/uL (ref 3.9–10.3)
lymph#: 1.8 10*3/uL (ref 0.9–3.3)

## 2016-02-27 ENCOUNTER — Telehealth: Payer: Self-pay | Admitting: *Deleted

## 2016-02-27 NOTE — Telephone Encounter (Signed)
-----   Message from Artis DelayNi Gorsuch, MD sent at 02/27/2016  8:17 AM EDT ----- Regarding: CBC pls let her know it is stable Continue close monitoring ----- Message -----    From: [facility]    Sent: 02/26/2016   2:32 PM      To: Artis DelayNi Gorsuch, MD

## 2016-02-27 NOTE — Telephone Encounter (Signed)
Informed pt of CBC results stable per Dr. Bertis RuddyGorsuch.  Keep appt next week on Monday 4/24 as scheduled.   She verbalized understanding.

## 2016-03-04 ENCOUNTER — Telehealth: Payer: Self-pay | Admitting: Hematology and Oncology

## 2016-03-04 ENCOUNTER — Other Ambulatory Visit (HOSPITAL_BASED_OUTPATIENT_CLINIC_OR_DEPARTMENT_OTHER): Payer: Medicare Other

## 2016-03-04 ENCOUNTER — Encounter: Payer: Self-pay | Admitting: Hematology and Oncology

## 2016-03-04 ENCOUNTER — Ambulatory Visit (HOSPITAL_BASED_OUTPATIENT_CLINIC_OR_DEPARTMENT_OTHER): Payer: Medicare Other | Admitting: Hematology and Oncology

## 2016-03-04 VITALS — BP 154/69 | HR 75 | Temp 97.8°F | Resp 18 | Ht 65.0 in | Wt 202.8 lb

## 2016-03-04 DIAGNOSIS — D638 Anemia in other chronic diseases classified elsewhere: Secondary | ICD-10-CM

## 2016-03-04 DIAGNOSIS — I10 Essential (primary) hypertension: Secondary | ICD-10-CM | POA: Diagnosis not present

## 2016-03-04 DIAGNOSIS — D693 Immune thrombocytopenic purpura: Secondary | ICD-10-CM | POA: Diagnosis not present

## 2016-03-04 LAB — CBC WITH DIFFERENTIAL/PLATELET
BASO%: 0.5 % (ref 0.0–2.0)
BASOS ABS: 0 10*3/uL (ref 0.0–0.1)
EOS ABS: 0 10*3/uL (ref 0.0–0.5)
EOS%: 0.3 % (ref 0.0–7.0)
HCT: 34.7 % — ABNORMAL LOW (ref 34.8–46.6)
HEMOGLOBIN: 11.1 g/dL — AB (ref 11.6–15.9)
LYMPH%: 29.3 % (ref 14.0–49.7)
MCH: 26.1 pg (ref 25.1–34.0)
MCHC: 32 g/dL (ref 31.5–36.0)
MCV: 81.5 fL (ref 79.5–101.0)
MONO#: 0.5 10*3/uL (ref 0.1–0.9)
MONO%: 6.8 % (ref 0.0–14.0)
NEUT%: 63.1 % (ref 38.4–76.8)
NEUTROS ABS: 4.4 10*3/uL (ref 1.5–6.5)
PLATELETS: 226 10*3/uL (ref 145–400)
RBC: 4.25 10*6/uL (ref 3.70–5.45)
RDW: 19 % — ABNORMAL HIGH (ref 11.2–14.5)
WBC: 6.9 10*3/uL (ref 3.9–10.3)
lymph#: 2 10*3/uL (ref 0.9–3.3)

## 2016-03-04 NOTE — Progress Notes (Signed)
[facility] OFFICE PROGRESS NOTE  [name], MD SUMMARY OF HEMATOLOGIC HISTORY:  #1 2012, she has thrombocytopenia that resolved spontaneously. The patient also recalled taking prednisone in the past for possible diagnosis of lupus #2 04/12/2013 she had mild microcytic anemia #3 07/09/2013 she has persistent mild microcytic anemia and thrombocytopenia with a platelet count of 51,000 #4 08/02/2013 I saw the patient and recommended a bone marrow aspirate and biopsy #5 08/06/2013 I perform bone marrow aspirate and biopsy. The patient received 1 unit of platelets for severe thrombocytopenia with a platelet count of 5000 #6 08/11/13: she is started on prednisone 80 mg daily #7 08/19/13: platelet count improves to 222. We appreciated steroid taper #8 09/02/2013 her platelet count dropped to 90,000. We increased prednisone back to 60 mg a day #9 09/16/13: dose of prednisone was reduced back to 40 mg #10 09/30/13: prednisone dose was reduced to 20 mg #11 10/14/13: prednisone dose was reduced to 10 mg #12 10/21/13: Prednisone dose was reduced to 5 mg #13 11/01/13: Prednisone dose was reduced to 2.5 mg #14 11/15/2013: Prednisone dose was reduced to 2.5 mg every other day #15 12/27/2013, prednisone is discontinued The patient was readmitted to the hospital from 01/26/2016 to 01/28/2016 for relapsed ITP. She received 2 doses of IVIG and high-dose dexamethasone 3 days INTERVAL HISTORY: [name] 75 y.o. female returns for further follow-up. She feels well. She has occasional dry cough but no fevers or chills. The patient denies any recent signs or symptoms of bleeding such as spontaneous epistaxis, hematuria or hematochezia.   I have reviewed the past medical history, past surgical history, social history and family history with the patient and they are unchanged from previous note.  ALLERGIES:  is allergic to codeine; fish allergy; lactose intolerance (gi); oxycodone; penicillins;  sulfonamide derivatives; tramadol hcl; and triamcinolone.  MEDICATIONS:  Current Outpatient Prescriptions  Medication Sig Dispense Refill  . acetaminophen (TYLENOL) 500 MG tablet Take 1 tablet (500 mg total) by mouth every 6 (six) hours as needed. (Patient taking differently: Take 500-1,000 mg by mouth every 6 (six) hours as needed for moderate pain. ) 30 tablet 0  . amLODipine (NORVASC) 10 MG tablet Take 10 mg by mouth 2 (two) times daily.  4  . BUTRANS 5 MCG/HR PTWK patch Place 1 patch onto the skin once a week.    . cholecalciferol (VITAMIN D) 1000 UNITS tablet Take 1,000 Units by mouth daily.    . citalopram (CELEXA) 20 MG tablet Take 20 mg by mouth daily.    Marland Kitchen levothyroxine (SYNTHROID, LEVOTHROID) 100 MCG tablet Take 100 mcg by mouth daily.      Marland Kitchen loratadine (CLARITIN) 10 MG tablet TAKE 1 TABLET (10 MG TOTAL) BY MOUTH DAILY. 30 tablet 5  . losartan (COZAAR) 100 MG tablet Take 100 mg by mouth daily.    . pantoprazole (PROTONIX) 40 MG tablet TAKE 1 TABLET (40 MG TOTAL) BY MOUTH DAILY. 90 tablet 1  . senna (SENOKOT) 8.6 MG tablet Take 1 tablet by mouth as needed for constipation.     Marland Kitchen tiZANidine (ZANAFLEX) 2 MG tablet Take 2 mg by mouth 2 (two) times daily as needed for muscle spasms.   2   No current facility-administered medications for this visit.     REVIEW OF SYSTEMS:   Constitutional: Denies fevers, chills or night sweats Eyes: Denies blurriness of vision Ears, nose, mouth, throat, and face: Denies mucositis or sore throat Cardiovascular: Denies palpitation, chest discomfort or lower extremity swelling  Gastrointestinal:  Denies nausea, heartburn or change in bowel habits Skin: Denies abnormal skin rashes Lymphatics: Denies new lymphadenopathy or easy bruising Neurological:Denies numbness, tingling or new weaknesses Behavioral/Psych: Mood is stable, no new changes  All other systems were reviewed with the patient and are negative.  PHYSICAL EXAMINATION: ECOG PERFORMANCE  STATUS: 0 - Asymptomatic  Filed Vitals:   03/04/16 1442  BP: 154/69  Pulse: 75  Temp: 97.8 F (36.6 C)  Resp: 18   Filed Weights   03/04/16 1442  Weight: 202 lb 12.8 oz (91.989 kg)    GENERAL:alert, no distress and comfortable SKIN: skin color, texture, turgor are normal, no rashes or significant lesions EYES: normal, Conjunctiva are pink and non-injected, sclera clear LUNGS: clear to auscultation and percussion with normal breathing effort Musculoskeletal:no cyanosis of digits and no clubbing  NEURO: alert & oriented x 3 with fluent speech, no focal motor/sensory deficits  LABORATORY DATA:  I have reviewed the data as listed     Component Value Date/Time   NA 141 02/05/2016 1124   NA 139 12/09/2015 0851   K 3.7 02/05/2016 1124   K 3.1* 12/09/2015 0851   CL 104 12/09/2015 0851   CO2 28 02/05/2016 1124   CO2 24 12/09/2015 0851   GLUCOSE 79 02/05/2016 1124   GLUCOSE 128* 12/09/2015 0851   BUN 18.3 02/05/2016 1124   BUN 11 12/09/2015 0851   CREATININE 1.2* 02/05/2016 1124   CREATININE 1.20* 12/09/2015 0851   CALCIUM 9.7 02/05/2016 1124   CALCIUM 10.1 12/09/2015 0851   PROT 8.1 02/05/2016 1124   PROT 7.9 08/02/2011 1534   ALBUMIN 3.0* 02/05/2016 1124   ALBUMIN 3.8 08/02/2011 1534   AST 32 02/05/2016 1124   AST 14 08/02/2011 1534   ALT 25 02/05/2016 1124   ALT 10 08/02/2011 1534   ALKPHOS 62 02/05/2016 1124   ALKPHOS 110 08/02/2011 1534   BILITOT 0.42 02/05/2016 1124   BILITOT 0.3 08/02/2011 1534   GFRNONAA 43* 12/09/2015 0851   GFRAA 50* 12/09/2015 0851    No results found for: SPEP, UPEP  Lab Results  Component Value Date   WBC 6.9 03/04/2016   NEUTROABS 4.4 03/04/2016   HGB 11.1* 03/04/2016   HCT 34.7* 03/04/2016   MCV 81.5 03/04/2016   PLT 226 03/04/2016      Chemistry      Component Value Date/Time   NA 141 02/05/2016 1124   NA 139 12/09/2015 0851   K 3.7 02/05/2016 1124   K 3.1* 12/09/2015 0851   CL 104 12/09/2015 0851   CO2 28 02/05/2016  1124   CO2 24 12/09/2015 0851   BUN 18.3 02/05/2016 1124   BUN 11 12/09/2015 0851   CREATININE 1.2* 02/05/2016 1124   CREATININE 1.20* 12/09/2015 0851      Component Value Date/Time   CALCIUM 9.7 02/05/2016 1124   CALCIUM 10.1 12/09/2015 0851   ALKPHOS 62 02/05/2016 1124   ALKPHOS 110 08/02/2011 1534   AST 32 02/05/2016 1124   AST 14 08/02/2011 1534   ALT 25 02/05/2016 1124   ALT 10 08/02/2011 1534   BILITOT 0.42 02/05/2016 1124   BILITOT 0.3 08/02/2011 1534      ASSESSMENT & PLAN:  ITP (idiopathic thrombocytopenic purpura) She has responded well to recent treatment with IVIG and high dose dexamethasone, without further relapses I plan to see her back in 3 month  Essential hypertension I recommend she follows with primary care provider for blood pressure medication adjustment.    Anemia  in chronic illness This could be due to anemia chronic disease. Clinically, she is not symptomatic.       All questions were answered. The patient knows to call the clinic with any problems, questions or concerns. No barriers to learning was detected.  I spent 15 minutes counseling the patient face to face. The total time spent in the appointment was 20 minutes and more than 50% was on counseling.     [facility], Gaspard Isbell, MD 4/24/20173:31 PM

## 2016-03-04 NOTE — Telephone Encounter (Signed)
Gave and printed appt sched and avs for pt for July  °

## 2016-03-04 NOTE — Assessment & Plan Note (Signed)
This could be due to anemia chronic disease. Clinically, she is not symptomatic.   

## 2016-03-04 NOTE — Assessment & Plan Note (Signed)
She has responded well to recent treatment with IVIG and high dose dexamethasone, without further relapses I plan to see her back in 3 month

## 2016-03-04 NOTE — Assessment & Plan Note (Signed)
I recommend she follows with primary care provider for blood pressure medication adjustment. 

## 2016-03-12 DIAGNOSIS — G894 Chronic pain syndrome: Secondary | ICD-10-CM | POA: Diagnosis not present

## 2016-03-12 DIAGNOSIS — Z79891 Long term (current) use of opiate analgesic: Secondary | ICD-10-CM | POA: Diagnosis not present

## 2016-03-12 DIAGNOSIS — M1288 Other specific arthropathies, not elsewhere classified, other specified site: Secondary | ICD-10-CM | POA: Diagnosis not present

## 2016-03-12 DIAGNOSIS — M25519 Pain in unspecified shoulder: Secondary | ICD-10-CM | POA: Diagnosis not present

## 2016-03-12 DIAGNOSIS — Z79899 Other long term (current) drug therapy: Secondary | ICD-10-CM | POA: Diagnosis not present

## 2016-03-12 DIAGNOSIS — M5137 Other intervertebral disc degeneration, lumbosacral region: Secondary | ICD-10-CM | POA: Diagnosis not present

## 2016-04-10 DIAGNOSIS — Z79899 Other long term (current) drug therapy: Secondary | ICD-10-CM | POA: Diagnosis not present

## 2016-04-10 DIAGNOSIS — G894 Chronic pain syndrome: Secondary | ICD-10-CM | POA: Diagnosis not present

## 2016-04-10 DIAGNOSIS — Z79891 Long term (current) use of opiate analgesic: Secondary | ICD-10-CM | POA: Diagnosis not present

## 2016-04-10 DIAGNOSIS — E669 Obesity, unspecified: Secondary | ICD-10-CM | POA: Diagnosis not present

## 2016-04-10 DIAGNOSIS — M47817 Spondylosis without myelopathy or radiculopathy, lumbosacral region: Secondary | ICD-10-CM | POA: Diagnosis not present

## 2016-04-10 DIAGNOSIS — M533 Sacrococcygeal disorders, not elsewhere classified: Secondary | ICD-10-CM | POA: Diagnosis not present

## 2016-04-10 DIAGNOSIS — M5137 Other intervertebral disc degeneration, lumbosacral region: Secondary | ICD-10-CM | POA: Diagnosis not present

## 2016-04-22 DIAGNOSIS — M533 Sacrococcygeal disorders, not elsewhere classified: Secondary | ICD-10-CM | POA: Diagnosis not present

## 2016-04-24 DIAGNOSIS — M25519 Pain in unspecified shoulder: Secondary | ICD-10-CM | POA: Diagnosis not present

## 2016-04-24 DIAGNOSIS — M75100 Unspecified rotator cuff tear or rupture of unspecified shoulder, not specified as traumatic: Secondary | ICD-10-CM | POA: Diagnosis not present

## 2016-04-24 DIAGNOSIS — M19019 Primary osteoarthritis, unspecified shoulder: Secondary | ICD-10-CM | POA: Diagnosis not present

## 2016-05-09 ENCOUNTER — Other Ambulatory Visit: Payer: Self-pay | Admitting: *Deleted

## 2016-05-09 ENCOUNTER — Telehealth: Payer: Self-pay | Admitting: *Deleted

## 2016-05-09 ENCOUNTER — Ambulatory Visit (HOSPITAL_BASED_OUTPATIENT_CLINIC_OR_DEPARTMENT_OTHER): Payer: Medicare Other

## 2016-05-09 DIAGNOSIS — D693 Immune thrombocytopenic purpura: Secondary | ICD-10-CM

## 2016-05-09 DIAGNOSIS — D649 Anemia, unspecified: Secondary | ICD-10-CM

## 2016-05-09 LAB — CBC WITH DIFFERENTIAL/PLATELET
BASO%: 1 % (ref 0.0–2.0)
BASOS ABS: 0.1 10*3/uL (ref 0.0–0.1)
EOS%: 2.2 % (ref 0.0–7.0)
Eosinophils Absolute: 0.2 10*3/uL (ref 0.0–0.5)
HCT: 37.3 % (ref 34.8–46.6)
HEMOGLOBIN: 12 g/dL (ref 11.6–15.9)
LYMPH%: 19.9 % (ref 14.0–49.7)
MCH: 25.6 pg (ref 25.1–34.0)
MCHC: 32.2 g/dL (ref 31.5–36.0)
MCV: 79.4 fL — ABNORMAL LOW (ref 79.5–101.0)
MONO#: 0.5 10*3/uL (ref 0.1–0.9)
MONO%: 7.2 % (ref 0.0–14.0)
NEUT#: 5.1 10*3/uL (ref 1.5–6.5)
NEUT%: 69.7 % (ref 38.4–76.8)
Platelets: 241 10*3/uL (ref 145–400)
RBC: 4.69 10*6/uL (ref 3.70–5.45)
RDW: 18.1 % — AB (ref 11.2–14.5)
WBC: 7.3 10*3/uL (ref 3.9–10.3)
lymph#: 1.5 10*3/uL (ref 0.9–3.3)

## 2016-05-09 NOTE — Telephone Encounter (Signed)
Pt walked into [facility] today c/o dizziness, bruising on her arms and fatigue x one week.   Dr. Bertis RuddyGorsuch notified and pt added on for lab appt.   Her CBC was good w/ normal Hgb and normal Platelet count.   Reviewed lab results w/ pt and her Nephew.   Discussed w/ pt her symptoms are not related to any anemia or low platelets.  I checked pt's BP and it was 164/96.   HR 100.  O2 sat 100%.  Informed pt BP is elevated.  Pt says she is due to take her noon "BP med."  Suggested to pt she f/u w/ PCP for elevated BP and her symptoms of dizziness and fatigue.  Pt says she is going to make appt w/ PCP today.

## 2016-05-10 DIAGNOSIS — M5137 Other intervertebral disc degeneration, lumbosacral region: Secondary | ICD-10-CM | POA: Diagnosis not present

## 2016-05-10 DIAGNOSIS — Z79891 Long term (current) use of opiate analgesic: Secondary | ICD-10-CM | POA: Diagnosis not present

## 2016-05-10 DIAGNOSIS — G894 Chronic pain syndrome: Secondary | ICD-10-CM | POA: Diagnosis not present

## 2016-05-10 DIAGNOSIS — M533 Sacrococcygeal disorders, not elsewhere classified: Secondary | ICD-10-CM | POA: Diagnosis not present

## 2016-05-10 DIAGNOSIS — M47817 Spondylosis without myelopathy or radiculopathy, lumbosacral region: Secondary | ICD-10-CM | POA: Diagnosis not present

## 2016-05-10 DIAGNOSIS — Z79899 Other long term (current) drug therapy: Secondary | ICD-10-CM | POA: Diagnosis not present

## 2016-05-14 ENCOUNTER — Emergency Department (HOSPITAL_COMMUNITY): Payer: Medicare Other

## 2016-05-14 ENCOUNTER — Observation Stay (HOSPITAL_COMMUNITY): Payer: Medicare Other

## 2016-05-14 ENCOUNTER — Encounter (HOSPITAL_COMMUNITY): Payer: Self-pay

## 2016-05-14 ENCOUNTER — Observation Stay (HOSPITAL_COMMUNITY)
Admission: EM | Admit: 2016-05-14 | Discharge: 2016-05-15 | Disposition: A | Discharge status: Home / Self Care | Payer: Medicare Other | Attending: Internal Medicine | Admitting: Internal Medicine

## 2016-05-14 DIAGNOSIS — Z79899 Other long term (current) drug therapy: Secondary | ICD-10-CM | POA: Diagnosis not present

## 2016-05-14 DIAGNOSIS — M6289 Other specified disorders of muscle: Secondary | ICD-10-CM

## 2016-05-14 DIAGNOSIS — Z87891 Personal history of nicotine dependence: Secondary | ICD-10-CM | POA: Diagnosis not present

## 2016-05-14 DIAGNOSIS — R200 Anesthesia of skin: Secondary | ICD-10-CM | POA: Diagnosis not present

## 2016-05-14 DIAGNOSIS — Z96653 Presence of artificial knee joint, bilateral: Secondary | ICD-10-CM | POA: Insufficient documentation

## 2016-05-14 DIAGNOSIS — R531 Weakness: Secondary | ICD-10-CM | POA: Insufficient documentation

## 2016-05-14 DIAGNOSIS — I129 Hypertensive chronic kidney disease with stage 1 through stage 4 chronic kidney disease, or unspecified chronic kidney disease: Secondary | ICD-10-CM | POA: Insufficient documentation

## 2016-05-14 DIAGNOSIS — R0789 Other chest pain: Secondary | ICD-10-CM | POA: Diagnosis not present

## 2016-05-14 DIAGNOSIS — M542 Cervicalgia: Secondary | ICD-10-CM | POA: Diagnosis not present

## 2016-05-14 DIAGNOSIS — E039 Hypothyroidism, unspecified: Secondary | ICD-10-CM | POA: Diagnosis not present

## 2016-05-14 DIAGNOSIS — M25512 Pain in left shoulder: Secondary | ICD-10-CM

## 2016-05-14 DIAGNOSIS — N189 Chronic kidney disease, unspecified: Secondary | ICD-10-CM | POA: Diagnosis not present

## 2016-05-14 DIAGNOSIS — D693 Immune thrombocytopenic purpura: Secondary | ICD-10-CM | POA: Diagnosis not present

## 2016-05-14 DIAGNOSIS — M1990 Unspecified osteoarthritis, unspecified site: Secondary | ICD-10-CM | POA: Insufficient documentation

## 2016-05-14 DIAGNOSIS — M549 Dorsalgia, unspecified: Secondary | ICD-10-CM | POA: Diagnosis not present

## 2016-05-14 DIAGNOSIS — M79602 Pain in left arm: Secondary | ICD-10-CM | POA: Diagnosis not present

## 2016-05-14 DIAGNOSIS — K219 Gastro-esophageal reflux disease without esophagitis: Secondary | ICD-10-CM | POA: Diagnosis not present

## 2016-05-14 DIAGNOSIS — I10 Essential (primary) hypertension: Secondary | ICD-10-CM | POA: Diagnosis present

## 2016-05-14 DIAGNOSIS — R079 Chest pain, unspecified: Secondary | ICD-10-CM | POA: Diagnosis not present

## 2016-05-14 LAB — COMPREHENSIVE METABOLIC PANEL
ALBUMIN: 3.7 g/dL (ref 3.5–5.0)
ALK PHOS: 82 U/L (ref 38–126)
ALT: 12 U/L — ABNORMAL LOW (ref 14–54)
AST: 13 U/L — AB (ref 15–41)
Anion gap: 7 (ref 5–15)
BILIRUBIN TOTAL: 0.5 mg/dL (ref 0.3–1.2)
BUN: 17 mg/dL (ref 6–20)
CALCIUM: 10.2 mg/dL (ref 8.9–10.3)
CO2: 28 mmol/L (ref 22–32)
Chloride: 103 mmol/L (ref 101–111)
Creatinine, Ser: 1.16 mg/dL — ABNORMAL HIGH (ref 0.44–1.00)
GFR calc Af Amer: 52 mL/min — ABNORMAL LOW (ref >60)
GFR calc non Af Amer: 45 mL/min — ABNORMAL LOW (ref >60)
GLUCOSE: 90 mg/dL (ref 65–99)
POTASSIUM: 3.4 mmol/L — AB (ref 3.5–5.1)
Sodium: 138 mmol/L (ref 135–145)
TOTAL PROTEIN: 7.4 g/dL (ref 6.5–8.1)

## 2016-05-14 LAB — CBC
HCT: 36.4 % (ref 36.0–46.0)
HEMOGLOBIN: 11.8 g/dL — AB (ref 12.0–15.0)
MCH: 25.5 pg — AB (ref 26.0–34.0)
MCHC: 32.4 g/dL (ref 30.0–36.0)
MCV: 78.6 fL (ref 78.0–100.0)
Platelets: 175 10*3/uL (ref 150–400)
RBC: 4.63 MIL/uL (ref 3.87–5.11)
RDW: 16.8 % — ABNORMAL HIGH (ref 11.5–15.5)
WBC: 5.7 10*3/uL (ref 4.0–10.5)

## 2016-05-14 LAB — I-STAT TROPONIN, ED: Troponin i, poc: 0.01 ng/mL (ref 0.00–0.08)

## 2016-05-14 MED ORDER — ASPIRIN 81 MG PO CHEW
324.0000 mg | CHEWABLE_TABLET | Freq: Once | ORAL | Status: DC
Start: 2016-05-14 — End: 2016-05-14

## 2016-05-14 MED ORDER — AMLODIPINE BESYLATE 10 MG PO TABS
10.0000 mg | ORAL_TABLET | Freq: Two times a day (BID) | ORAL | Status: DC
  Administered 2016-05-14 – 2016-05-15 (×2): 10 mg via ORAL
  Filled 2016-05-14 (×2): qty 1

## 2016-05-14 MED ORDER — PANTOPRAZOLE SODIUM 40 MG PO TBEC
40.0000 mg | DELAYED_RELEASE_TABLET | Freq: Every day | ORAL | Status: DC
  Administered 2016-05-15: 40 mg via ORAL
  Filled 2016-05-14: qty 1

## 2016-05-14 MED ORDER — SENNA 8.6 MG PO TABS: 1.0000 | ORAL_TABLET | Freq: Every day | ORAL | Status: DC | PRN

## 2016-05-14 MED ORDER — LEVOTHYROXINE SODIUM 100 MCG PO TABS
100.0000 ug | ORAL_TABLET | Freq: Every day | ORAL | Status: DC
  Administered 2016-05-15: 100 ug via ORAL
  Filled 2016-05-14: qty 1

## 2016-05-14 MED ORDER — ACETAMINOPHEN 500 MG PO TABS
500.0000 mg | ORAL_TABLET | Freq: Four times a day (QID) | ORAL | Status: DC | PRN
  Administered 2016-05-15: 1000 mg via ORAL
  Filled 2016-05-14: qty 2

## 2016-05-14 MED ORDER — NITROGLYCERIN 0.4 MG SL SUBL
0.4000 mg | SUBLINGUAL_TABLET | SUBLINGUAL | Status: DC | PRN
  Administered 2016-05-14 (×2): 0.4 mg via SUBLINGUAL
  Filled 2016-05-14: qty 1

## 2016-05-14 MED ORDER — ENOXAPARIN SODIUM 40 MG/0.4ML ~~LOC~~ SOLN
40.0000 mg | SUBCUTANEOUS | Status: DC
  Administered 2016-05-14: 40 mg via SUBCUTANEOUS
  Filled 2016-05-14: qty 0.4

## 2016-05-14 MED ORDER — TIZANIDINE HCL 2 MG PO TABS
2.0000 mg | ORAL_TABLET | Freq: Two times a day (BID) | ORAL | Status: DC | PRN
  Administered 2016-05-15: 2 mg via ORAL
  Filled 2016-05-14 (×3): qty 1

## 2016-05-14 MED ORDER — LOSARTAN POTASSIUM 50 MG PO TABS
100.0000 mg | ORAL_TABLET | Freq: Every day | ORAL | Status: DC
  Administered 2016-05-15: 100 mg via ORAL
  Filled 2016-05-14: qty 2

## 2016-05-14 MED ORDER — CITALOPRAM HYDROBROMIDE 20 MG PO TABS
20.0000 mg | ORAL_TABLET | Freq: Every day | ORAL | Status: DC
  Administered 2016-05-15: 20 mg via ORAL
  Filled 2016-05-14: qty 1

## 2016-05-14 MED ORDER — SODIUM CHLORIDE 0.9% FLUSH
3.0000 mL | Freq: Two times a day (BID) | INTRAVENOUS | Status: DC
Start: 2016-05-14 — End: 2016-05-15
  Administered 2016-05-14: 3 mL via INTRAVENOUS

## 2016-05-14 MED ORDER — LORAZEPAM 2 MG/ML IJ SOLN
1.0000 mg | Freq: Once | INTRAMUSCULAR | Status: AC
  Administered 2016-05-14: 1 mg via INTRAVENOUS
  Filled 2016-05-14: qty 1

## 2016-05-14 MED ORDER — LORATADINE 10 MG PO TABS
10.0000 mg | ORAL_TABLET | Freq: Every day | ORAL | Status: DC
  Administered 2016-05-15: 10 mg via ORAL
  Filled 2016-05-14: qty 1

## 2016-05-14 NOTE — ED Notes (Signed)
MD at bedside. 

## 2016-05-14 NOTE — H&P (Signed)
History and Physical  [name] ZOX:[id number] DOB: [birth year] DOA: 05/14/2016  PCP:  [name], MD   Chief Complaint:  Pain in left arm Chest pain   History of Present Illness:  Patient is a 75 yo female with history of OA, ITP, HTN, hypothyroidism who came with cc of left arm pain and numbness/tingling that started 2 weeks ago and has been constant, fluctuating in severity associated with left shoulder/neck pain occasionally. She felt her left arm is weaker too. She also has had left leg weakness noticed over the past 2 weeks. She also noticed dysphagia and feeling something was stuck in her throat 2 weeks ago that has resolved with feeling of left facial droop that is not apparent to anyone beside herself. No slurred speech. No imbalance but weakness while using her cane. She also had left sided chest pain that started 2-3 days ago with no dyspnea but with cough productive of clear phlegm without fever or chills or wheezing. No N/V/D/C/abd pain/dsyuria. No history of CVA or MI.   Review of Systems:  CONSTITUTIONAL:     No night sweats.  No fatigue.  No fever. No chills. Eyes:                            No visual changes.  No eye pain.  No eye discharge.   ENT:                              No epistaxis.  No sinus pain.  No sore throat.   No congestion. RESPIRATORY:           +cough.  No wheeze.  No hemoptysis.  No dyspnea CARDIOVASCULAR   :  +chest pains.  No palpitations. GASTROINTESTINAL:  No abdominal pain.  No nausea. No vomiting.  No diarrhea. No  constipation.  No hematemesis.  No hematochezia.  No melena. GENITOURINARY:      No urgency.  No frequency.  No dysuria.  No hematuria.  No   obstructive symptoms.  No discharge.  No pain.  MUSCULOSKELETAL:  +musculoskeletal pain.  No joint swelling.  No arthritis. NEUROLOGICAL:        No confusion.  +weakness. No headache. No seizure. PSYCHIATRIC:             No depression. No anxiety. No suicidal ideation. SKIN:                              No rashes.  No lesions.  No wounds. ENDOCRINE:                No weight loss.  No polydipsia.  No polyuria.  No polyphagia. HEMATOLOGIC:           No purpura.  No petechiae.  No bleeding.  ALLERGIC                 : No pruritus.  No angioedema Other:  Past Medical and Surgical History:   Past Medical History  Diagnosis Date  . Hypertension   . History of Graves' disease   . Hypercalcemia   . Allergic rhinitis   . Murmur, heart   . Back pain   . Renal insufficiency   . Dyslipidemia   . Osteoarthritis, knee   . Bronchiectasis   . Thoracic aortic aneurysm (HCC)   . Hx of  colonic polyp   . Thrombocytopenia, unspecified (HCC) 08/02/2013  . Fatigue 08/02/2013  . 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 Thrush, oral 09/02/2013  . Hyperlipidemia   . GERD (gastroesophageal reflux disease)   . Anxiety   . Dysuria 10/14/2013  . Thrush 10/14/2013  . UTI (lower urinary tract infection) 10/14/2013  . Hypokalemia 11/15/2013  . Thyroid disease    Past Surgical History  Procedure Laterality Date  . Abdominal hysterectomy      b/c fibroids  . Knee arthroplasty Right 10/2007  . Total knee arthroplasty Bilateral   . Appendectomy      Social History:   reports that she quit smoking about 39 years ago. Her smoking use included Cigarettes. She has a 10 pack-year smoking history. She has never used smokeless tobacco. She reports that she does not drink alcohol or use illicit drugs.   Allergies  Allergen Reactions  . Codeine Nausea And Vomiting    REACTION: Nausea/vomiting  . Fish Allergy Nausea Only  . Lactose Intolerance (Gi) Diarrhea  . Oxycodone Nausea And Vomiting  . Penicillins     REACTION: headache, tremors, Nausea Has patient had a PCN reaction causing immediate rash, facial/tongue/throat swelling, SOB or lightheadedness with hypotension: No Has patient had a PCN reaction causing severe rash involving mucus membranes or skin necrosis: No Has patient had a  PCN reaction that required hospitalization No Has patient had a PCN reaction occurring within the last 10 years: No If all of the above answers are "NO", then may proceed with Cephalosporin use.   . Sulfonamide Derivatives Swelling    REACTION: mouth swells  . Tramadol Hcl Other (See Comments)    REACTION: GI Intolerance  . Triamcinolone Swelling    REACTION: angio edema    Family History  Problem Relation Age of Onset  . Kidney cancer Brother [id number]  . Prostate cancer Brother   . Colon cancer Brother   . Breast cancer Maternal Aunt   . Liver cancer Maternal Uncle   . Colon polyps Brother   . Heart disease Mother   . Irritable bowel syndrome Maternal Aunt   . Kidney disease Mother     on dialysis      Prior to Admission medications   Medication Sig Start Date End Date Taking? Authorizing Provider  acetaminophen (TYLENOL) 500 MG tablet Take 1 tablet (500 mg total) by mouth every 6 (six) hours as needed. Patient taking differently: Take 500-1,000 mg by mouth every 6 (six) hours as needed for moderate pain.  12/09/15  Yes Everlene FarrierWilliam Dansie, PA-C  amLODipine (NORVASC) 10 MG tablet Take 10 mg by mouth 2 (two) times daily. Reported on 05/14/2016 01/23/16  Yes Historical Provider, MD  BUTRANS 5 MCG/HR PTWK patch Place 5 mcg onto the skin once a week. Thursday 01/26/16  Yes Historical Provider, MD  cholecalciferol (VITAMIN D) 1000 UNITS tablet Take 1,000 Units by mouth daily.   Yes Historical Provider, MD  citalopram (CELEXA) 20 MG tablet Take 20 mg by mouth daily.   Yes Historical Provider, MD  levothyroxine (SYNTHROID, LEVOTHROID) 100 MCG tablet Take 100 mcg by mouth daily before breakfast.    Yes Historical Provider, MD  loratadine (CLARITIN) 10 MG tablet TAKE 1 TABLET (10 MG TOTAL) BY MOUTH DAILY. 12/25/15  Yes Leslye Peerobert S Byrum, MD  losartan (COZAAR) 100 MG tablet Take 100 mg by mouth daily.   Yes Historical Provider, MD  pantoprazole (PROTONIX) 40 MG tablet TAKE 1 TABLET (40 MG TOTAL) BY MOUTH  DAILY.  Patient taking differently: Take 40 mg by mouth daily. TAKE 1 TABLET (40 MG TOTAL) BY MOUTH DAILY. 12/20/15  Yes Bevelyn NgoSarah F Groce, NP  senna (SENOKOT) 8.6 MG tablet Take 1 tablet by mouth daily as needed for constipation. Reported on 05/14/2016   Yes Historical Provider, MD  tiZANidine (ZANAFLEX) 2 MG tablet Take 2 mg by mouth 2 (two) times daily as needed for muscle spasms.  12/25/15  Yes Historical Provider, MD    Physical Exam: BP 133/85 mmHg  Pulse 98  Temp(Src) 98 F (36.7 C) (Oral)  Resp 16  SpO2 100%  GENERAL :   Alert and cooperative, and appears to be in no acute distress. HEAD:           normocephalic. EYES:            PERRL, EOMI.  vision is grossly intact. EARS:           hearing grossly intact. NOSE:           No nasal discharge. THROAT:     Oral cavity and pharynx normal.   NECK:          supple, non-tender.  CARDIAC:    Normal S1 and S2. No gallop. No murmurs.  Vascular:     no peripheral edema.  LUNGS:       Clear to auscultation  ABDOMEN: Positive bowel sounds. Soft, nondistended, nontender. No guarding or rebound.      MSK:           No joint erythema or tenderness. Normal muscular development. EXT           : No significant deformity or joint abnormality. Neuro        : Alert, oriented to person, place, and time.                      CN II-XII intact.                       Strength and sensation asymmetric L 4+/5 comparing to R 5/5 SKIN:            No rash. No lesions. PSYCH:       No hallucination. Patient is not suicidal.          Labs on Admission:  Reviewed.   Radiological Exams on Admission: Dg Chest 2 View  05/14/2016  CLINICAL DATA:  Left shoulder pain EXAM: CHEST  2 VIEW COMPARISON:  12/09/2015 FINDINGS: Mild cardiomegaly. Normal vascularity. Clear lungs. No pleural effusion and no pneumothorax. IMPRESSION: No active cardiopulmonary disease. Electronically Signed   By: Jolaine ClickArthur  Hoss M.D.   On: 05/14/2016 10:10    EKG:  Independently reviewed.  NSR  Assessment/Plan  Left side weakness :  Per history and exam comparing to right side Will check MRI brain w/o contrast to r/o CVA Can't get aspirin per patient due to history of ITP: will await MRI results PT consult in am Neurochecks  Left arm pain/shoudler pain/cervical pain: Unclear etiology , possible referred pain/ OA Will check cervical spine MRI Tylenol prn pain  Chest pain: Not cardiac, atypical  Will check another trop, trop initially unremarkable and EKG with NSR  CKD : Cr at baseline   Hypothyroidism: cont synthroid   Input & Output: NA Lines & Tubes: PIV DVT prophylaxis:  [place] enoxaparin  GI prophylaxis: NA Consultants: PT Code Status: Full  Family Communication: None at bedside  Disposition Plan: Obs  Gennaro Africa M.D [facility]

## 2016-05-14 NOTE — Progress Notes (Signed)
PT Cancellation Note  Patient Details Name: Kandis FantasiaGloria Mersman MRN: [id number] DOB: 12/12/1940   Cancelled Treatment:    Reason Eval/Treat Not Completed: Order received. Chart reviewed. PT eval requested to be performed in am per MD progress note. Will check back on tomorrow. Thanks.    Rebeca AlertJannie Raliegh Scobie, MPT Pager: [phone] \

## 2016-05-14 NOTE — ED Notes (Signed)
Pt states left chest, shoulder, neck, arm and leg pain since yesterday.  Slight fluttering in chest.  No shortness of breath.  Recent cough.  No injury or trauma.  No fever

## 2016-05-14 NOTE — ED Notes (Signed)
[name] LaymanLindsey (family) left phone number [phone] home, [phone] cell

## 2016-05-14 NOTE — ED Provider Notes (Signed)
CSN: [id number]     Arrival date & time 05/14/16  [id number] History   First MD Initiated Contact with Patient 05/14/16 1002     Chief Complaint  Patient presents with  . Shoulder Pain  . Chest Pain     (Consider location/radiation/quality/duration/timing/severity/associated sxs/prior Treatment) HPI   Chest pain radiating down left arm. Began 2 days ago.  Shoulder pain to neck pain and down arm, makes it feel like it has gone to sleep.  Constant chest pain x2 pain, pressure-like pain.  Heating pad did not help. Gas-x helped some but still feeling some chest pressure.  Arm numb and weak and pain.  Does not seem exertional, but not moving much.  Occurs at rest.  Cough 1-2 weeks, yellow phlegm. Pain mild now, was more severe.    In march was hospitalized for bronchiectasis and ITP  Has had back pain, sees pain doctor, has been worse, shooting down left leg, dragging leg more, 1 week ago  Past Medical History  Diagnosis Date  . Hypertension   . History of Graves' disease   . Hypercalcemia   . Allergic rhinitis   . Murmur, heart   . Back pain   . Renal insufficiency   . Dyslipidemia   . Osteoarthritis, knee   . Bronchiectasis   . Thoracic aortic aneurysm (HCC)   . Hx of colonic polyp   . Thrombocytopenia, unspecified (HCC) 08/02/2013  . Fatigue 08/02/2013  . 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 Thrush, oral 09/02/2013  . Hyperlipidemia   . GERD (gastroesophageal reflux disease)   . Anxiety   . Dysuria 10/14/2013  . Thrush 10/14/2013  . UTI (lower urinary tract infection) 10/14/2013  . Hypokalemia 11/15/2013  . Thyroid disease    Past Surgical History  Procedure Laterality Date  . Abdominal hysterectomy      b/c fibroids  . Knee arthroplasty Right 10/2007  . Total knee arthroplasty Bilateral   . Appendectomy     Family History  Problem Relation Age of Onset  . Kidney cancer Brother 3560  . Prostate cancer Brother   . Colon cancer Brother   . Breast cancer  Maternal Aunt   . Liver cancer Maternal Uncle   . Colon polyps Brother   . Heart disease Mother   . Irritable bowel syndrome Maternal Aunt   . Kidney disease Mother     on dialysis   Social History  Substance Use Topics  . Smoking status: Former Smoker -- 1.00 packs/day for 10 years    Types: Cigarettes    Quit date: 11/11/1976  . Smokeless tobacco: Never Used  . Alcohol Use: No   OB History    No data available     Review of Systems  Constitutional: Positive for fatigue. Negative for fever.  HENT: Positive for congestion and rhinorrhea. Negative for sore throat.   Eyes: Positive for discharge (clear right side). Negative for visual disturbance.  Respiratory: Positive for cough (1-2 weeks) and shortness of breath.   Cardiovascular: Positive for chest pain.  Gastrointestinal: Negative for nausea, vomiting and abdominal pain.  Genitourinary: Negative for difficulty urinating.  Musculoskeletal: Positive for back pain. Negative for neck pain.  Skin: Negative for rash.  Neurological: Positive for weakness, light-headedness and numbness. Negative for syncope and headaches.      Allergies  Codeine; Fish allergy; Lactose intolerance (gi); Oxycodone; Penicillins; Sulfonamide derivatives; Tramadol hcl; and Triamcinolone  Home Medications   Prior to Admission medications   Medication Sig Start Date End  Date Taking? Authorizing Provider  acetaminophen (TYLENOL) 500 MG tablet Take 1 tablet (500 mg total) by mouth every 6 (six) hours as needed. Patient taking differently: Take 500-1,000 mg by mouth every 6 (six) hours as needed for moderate pain.  12/09/15  Yes Everlene Farrier, PA-C  amLODipine (NORVASC) 10 MG tablet Take 10 mg by mouth 2 (two) times daily. Reported on 05/14/2016 01/23/16  Yes Historical Provider, MD  BUTRANS 5 MCG/HR PTWK patch Place 5 mcg onto the skin once a week. Thursday 01/26/16  Yes Historical Provider, MD  cholecalciferol (VITAMIN D) 1000 UNITS tablet Take 1,000  Units by mouth daily.   Yes Historical Provider, MD  citalopram (CELEXA) 20 MG tablet Take 20 mg by mouth daily.   Yes Historical Provider, MD  levothyroxine (SYNTHROID, LEVOTHROID) 100 MCG tablet Take 100 mcg by mouth daily before breakfast.    Yes Historical Provider, MD  loratadine (CLARITIN) 10 MG tablet TAKE 1 TABLET (10 MG TOTAL) BY MOUTH DAILY. 12/25/15  Yes Leslye Peer, MD  losartan (COZAAR) 100 MG tablet Take 100 mg by mouth daily.   Yes Historical Provider, MD  pantoprazole (PROTONIX) 40 MG tablet TAKE 1 TABLET (40 MG TOTAL) BY MOUTH DAILY. Patient taking differently: Take 40 mg by mouth daily. TAKE 1 TABLET (40 MG TOTAL) BY MOUTH DAILY. 12/20/15  Yes Bevelyn Ngo, NP  senna (SENOKOT) 8.6 MG tablet Take 1 tablet by mouth daily as needed for constipation. Reported on 05/14/2016   Yes Historical Provider, MD  tiZANidine (ZANAFLEX) 2 MG tablet Take 2 mg by mouth 2 (two) times daily as needed for muscle spasms.  12/25/15  Yes Historical Provider, MD   BP 146/87 mmHg  Pulse 105  Temp(Src) 99 F (37.2 C) (Oral)  Resp 16  Ht 5' 5.5" (1.664 m)  Wt 193 lb 5.5 oz (87.7 kg)  BMI 31.67 kg/m2  SpO2 96% Physical Exam  Constitutional: She is oriented to person, place, and time. She appears well-developed and well-nourished. No distress.  HENT:  Head: Normocephalic and atraumatic.  Eyes: Conjunctivae and EOM are normal.  Neck: Normal range of motion.  Cardiovascular: Normal rate, regular rhythm, normal heart sounds and intact distal pulses.  Exam reveals no gallop and no friction rub.   No murmur heard. Pulmonary/Chest: Effort normal and breath sounds normal. No respiratory distress. She has no wheezes. She has no rales.  Abdominal: Soft. She exhibits no distension. There is no tenderness. There is no guarding.  Musculoskeletal: She exhibits no edema.       Lumbar back: She exhibits tenderness.  Neurological: She is alert and oriented to person, place, and time. She has normal strength. A  sensory deficit (reports altered sensation of left arm, also reports chronic altered sensation of L leg given hx of sciatica) is present. No cranial nerve deficit. Coordination normal. GCS eye subscore is 4. GCS verbal subscore is 5. GCS motor subscore is 6.  Skin: Skin is warm and dry. No rash noted. She is not diaphoretic. No erythema.  Nursing note and vitals reviewed.   ED Course  Procedures (including critical care time) Labs Review Labs Reviewed  CBC - Abnormal; Notable for the following:    Hemoglobin 11.8 (*)    MCH 25.5 (*)    RDW 16.8 (*)    All other components within normal limits  COMPREHENSIVE METABOLIC PANEL - Abnormal; Notable for the following:    Potassium 3.4 (*)    Creatinine, Ser 1.16 (*)    AST  13 (*)    ALT 12 (*)    GFR calc non Af Amer 45 (*)    GFR calc Af Amer 52 (*)    All other components within normal limits  COMPREHENSIVE METABOLIC PANEL  CBC  I-STAT TROPOININ, ED    Imaging Review Dg Chest 2 View  05/14/2016  CLINICAL DATA:  Left shoulder pain EXAM: CHEST  2 VIEW COMPARISON:  12/09/2015 FINDINGS: Mild cardiomegaly. Normal vascularity. Clear lungs. No pleural effusion and no pneumothorax. IMPRESSION: No active cardiopulmonary disease. Electronically Signed   By: Jolaine ClickArthur  Hoss M.D.   On: 05/14/2016 10:10   Mr Brain Wo Contrast  05/14/2016  CLINICAL DATA:  Left arm pain, numbness and tingling beginning 2 weeks ago. Some left arm and left leg weakness as well. EXAM: MRI HEAD WITHOUT CONTRAST TECHNIQUE: Multiplanar, multiecho pulse sequences of the brain and surrounding structures were obtained without intravenous contrast. COMPARISON:  None. FINDINGS: Diffusion imaging does not show any acute or subacute infarction. The brainstem and cerebellum are normal. Cerebral hemispheres show minimal punctate foci of T2 and FLAIR signal in the white matter consistent with minimal small vessel disease, less than often seen in healthy individuals of this age. No cortical  or large vessel territory infarction. No mass lesion, hemorrhage, hydrocephalus or extra-axial collection. Subarachnoid spaces are prominent due to age related volume loss. No pituitary mass. No inflammatory sinus disease. No skull or skullbase lesion. Major vessels at the base of the brain show flow. IMPRESSION: No acute finding or specific explanation for the presenting symptoms. Age related atrophy and minimal small vessel change of the cerebral hemispheric white matter. Electronically Signed   By: Paulina FusiMark  Shogry M.D.   On: 05/14/2016 16:40   Mr Cervical Spine Wo Contrast  05/14/2016  CLINICAL DATA:  Left arm numbness tingling in weakness. Symptoms of 2 weeks duration. EXAM: MRI CERVICAL SPINE WITHOUT CONTRAST TECHNIQUE: Multiplanar, multisequence MR imaging of the cervical spine was performed. No intravenous contrast was administered. COMPARISON:  None. FINDINGS: The examination suffers from considerable motion degradation. Alignment: Normal Vertebrae: No fracture or significant focal lesion. Insignificant lipoma posteriorly within the C7 vertebral body. Cord: No cord compression or cord lesion. Posterior Fossa, vertebral arteries, paraspinal tissues: Normal Disc levels: Foramen magnum and C1-2 are normal. C2-3:  Normal C3-4: Mild uncovertebral hypertrophy. No canal or foraminal stenosis. C4-5:  Normal. C5-6: Shallow protrusion of the disc, focally prominent in the left foraminal region. This could possibly affect the left C6 nerve root. C6-7:  Minimal uncovertebral hypertrophy.  No stenosis. C7-T1:  Minimal facet degeneration.  No stenosis. IMPRESSION: Significant motion degradation. I think there is left foraminal encroachment at C5-6 by disc material that could affect the left C6 nerve root. Because of the motion degradation, this is not clearly demonstrated. The may be necessary for the exam to be repeated with sedation or pain relief. Consideration could also be given to myelography. Electronically Signed    By: Paulina FusiMark  Shogry M.D.   On: 05/14/2016 16:45   I have personally reviewed and evaluated these images and lab results as part of my medical decision-making.   EKG Interpretation   Date/Time:  Tuesday May 14 2016 11:00:59 EDT Ventricular Rate:  61 PR Interval:    QRS Duration: 86 QT Interval:  392 QTC Calculation: 395 R Axis:   2 Text Interpretation:  Sinus rhythm PVC no longer present No other  significant change since prior ECG Confirmed by [facility] MD, Alexandera Kuntzman  ([id number]) on 05/14/2016 11:53:35 PM  MDM   Final diagnoses:  Chest pain, unspecified chest pain type  Left arm pain   74yo female with history of OA, ITP, htn, hlpd, hypothyroidism, bronchiectasis presents with concern for 2 days of chest pressure with radiation to left neck and arm.  EKG without acute changes. Initial troponin negative. No tachypnea/tachycardia, no hypoxia and doubt PE.  Patient with strong bilateral upper and lower extremity pulses, normal XR and doubt dissection by hx/physical.  Pt reports CP radiating to arm is also associated with tingling/feeling of heaviness, however neurologic exam other than sensation of left arm is within normal limits and doubt CVA and feel these symptoms are more concerning for anginal equivalent.  Pt has hx of chronic back pain with radiation down left leg and chronic LLE numbness which is unchanged from baseline, and while she reports possible LLE weakness for the past week, her exam is normal and in setting of pain/sciatica suspect lower back etiology of these symptoms. Doubt CVA by exam, history. Pt with hx of bronchiectasis however no tachypnea, no hypoxia, normal respiratory exam and doubt exacerbation of bronchiectasis.    Chest pressure with radiation to arm concerning for anginal equivalent.  Pt is high risk HEAR score and will admit pt for further cardiac evaluation. She reports she cannot take aspirin given hx of ITP.    Alvira Monday, MD 05/15/16 [phone]

## 2016-05-15 ENCOUNTER — Observation Stay (HOSPITAL_COMMUNITY): Payer: Medicare Other

## 2016-05-15 DIAGNOSIS — R079 Chest pain, unspecified: Secondary | ICD-10-CM | POA: Diagnosis not present

## 2016-05-15 DIAGNOSIS — R0789 Other chest pain: Secondary | ICD-10-CM | POA: Diagnosis not present

## 2016-05-15 DIAGNOSIS — I10 Essential (primary) hypertension: Secondary | ICD-10-CM

## 2016-05-15 DIAGNOSIS — M1990 Unspecified osteoarthritis, unspecified site: Secondary | ICD-10-CM | POA: Diagnosis not present

## 2016-05-15 DIAGNOSIS — M25512 Pain in left shoulder: Secondary | ICD-10-CM | POA: Diagnosis not present

## 2016-05-15 LAB — COMPREHENSIVE METABOLIC PANEL
ALK PHOS: 73 U/L (ref 38–126)
ALT: 10 U/L — AB (ref 14–54)
ANION GAP: 7 (ref 5–15)
AST: 11 U/L — ABNORMAL LOW (ref 15–41)
Albumin: 3.3 g/dL — ABNORMAL LOW (ref 3.5–5.0)
BILIRUBIN TOTAL: 0.6 mg/dL (ref 0.3–1.2)
BUN: 18 mg/dL (ref 6–20)
CALCIUM: 9.8 mg/dL (ref 8.9–10.3)
CO2: 25 mmol/L (ref 22–32)
CREATININE: 1.2 mg/dL — AB (ref 0.44–1.00)
Chloride: 103 mmol/L (ref 101–111)
GFR, EST AFRICAN AMERICAN: 50 mL/min — AB (ref >60)
GFR, EST NON AFRICAN AMERICAN: 43 mL/min — AB (ref >60)
Glucose, Bld: 110 mg/dL — ABNORMAL HIGH (ref 65–99)
Potassium: 3.3 mmol/L — ABNORMAL LOW (ref 3.5–5.1)
SODIUM: 135 mmol/L (ref 135–145)
TOTAL PROTEIN: 6.5 g/dL (ref 6.5–8.1)

## 2016-05-15 LAB — CBC
HCT: 32.7 % — ABNORMAL LOW (ref 36.0–46.0)
HEMOGLOBIN: 10.8 g/dL — AB (ref 12.0–15.0)
MCH: 25.8 pg — AB (ref 26.0–34.0)
MCHC: 33 g/dL (ref 30.0–36.0)
MCV: 78.2 fL (ref 78.0–100.0)
PLATELETS: 208 10*3/uL (ref 150–400)
RBC: 4.18 MIL/uL (ref 3.87–5.11)
RDW: 16.7 % — ABNORMAL HIGH (ref 11.5–15.5)
WBC: 4.3 10*3/uL (ref 4.0–10.5)

## 2016-05-15 MED ORDER — POTASSIUM CHLORIDE CRYS ER 20 MEQ PO TBCR
40.0000 meq | EXTENDED_RELEASE_TABLET | Freq: Once | ORAL | Status: AC
  Administered 2016-05-15: 40 meq via ORAL
  Filled 2016-05-15: qty 2

## 2016-05-15 NOTE — Evaluation (Signed)
Physical Therapy One Time Evaluation Patient Details Name: [name] MRN: [id number] DOB: September 25, 1941 Today's Date: 05/15/2016   History of Present Illness  75 yo female with history of OA, ITP, HTN, hypothyroidism who presented to ED with cc of left arm pain and numbness/tingling that started 2 weeks ago and has been constant, fluctuating in severity associated with left shoulder/neck pain occasionally. She felt her left arm is weaker too. MRI head negative for acute findings.  C-spine imaging showing possible "left foraminal encroachment at C5-6 by disc material that could affect the left C6 nerve root"  Clinical Impression  Patient evaluated by Physical Therapy with no further acute PT needs identified. All education has been completed and the patient has no further questions. See below for any follow-up Physical Therapy or equipment needs. PT is signing off. Thank you for this referral.     Follow Up Recommendations No PT follow up    Equipment Recommendations  None recommended by PT    Recommendations for Other Services       Precautions / Restrictions Precautions Precautions: None Restrictions Weight Bearing Restrictions: No      Mobility  Bed Mobility Overal bed mobility: Modified Independent                Transfers Overall transfer level: Modified independent                  Ambulation/Gait Ambulation/Gait assistance: Supervision;Modified independent (Device/Increase time) Ambulation Distance (Feet): 200 Feet Assistive device: Straight cane Gait Pattern/deviations: Step-through pattern;Decreased stride length     General Gait Details: slow but steady pace, HR 75-80 bpm during gait  Stairs            Wheelchair Mobility    Modified Rankin (Stroke Patients Only)       Balance                                             Pertinent Vitals/Pain Pain Assessment: 0-10 Pain Score: 5  Pain Location: L arm Pain  Intervention(s): Monitored during session    Home Living Family/patient expects to be discharged to:: Private residence Living Arrangements: Alone   Type of Home: House Home Access: Stairs to enter   Secretary/administratorntrance Stairs-Number of Steps: 2 Home Layout: One level Home Equipment: Cane - single point      Prior Function Level of Independence: Independent with assistive device(s)               Hand Dominance        Extremity/Trunk Assessment               Lower Extremity Assessment: Overall WFL for tasks assessed         Communication   Communication: No difficulties  Cognition Arousal/Alertness: Awake/alert Behavior During Therapy: WFL for tasks assessed/performed Overall Cognitive Status: Within Functional Limits for tasks assessed                      General Comments      Exercises        Assessment/Plan    PT Assessment Patent does not need any further PT services  PT Diagnosis Difficulty walking   PT Problem List    PT Treatment Interventions     PT Goals (Current goals can be found in the Care Plan section) Acute Rehab PT Goals PT  Goal Formulation: All assessment and education complete, DC therapy    Frequency     Barriers to discharge        Co-evaluation               End of Session   Activity Tolerance: Patient tolerated treatment well Patient left: in bed;with call bell/phone within reach Nurse Communication: Mobility status    Functional Assessment Tool Used: clinical judgement Functional Limitation: Mobility: Walking and moving around Mobility: Walking and Moving Around Current Status (Z6109(G8978): 0 percent impaired, limited or restricted Mobility: Walking and Moving Around Goal Status [phone](G8979): 0 percent impaired, limited or restricted Mobility: Walking and Moving Around Discharge Status [phone](G8980): 0 percent impaired, limited or restricted    Time: 0925-0935 PT Time Calculation (min) (ACUTE ONLY): 10  min   Charges:   PT Evaluation $PT Eval Low Complexity: 1 Procedure     PT G Codes:   PT G-Codes **NOT FOR INPATIENT CLASS** Functional Assessment Tool Used: clinical judgement Functional Limitation: Mobility: Walking and moving around Mobility: Walking and Moving Around Current Status (B1478(G8978): 0 percent impaired, limited or restricted Mobility: Walking and Moving Around Goal Status (G9562(G8979): 0 percent impaired, limited or restricted Mobility: Walking and Moving Around Discharge Status (Z3086(G8980): 0 percent impaired, limited or restricted    Riki Gehring,KATHrine E 05/15/2016, 11:55 AM Zenovia JarredKati Dmauri Rosenow, PT, DPT 05/15/2016 Pager: [phone]

## 2016-05-15 NOTE — Discharge Summary (Signed)
Physician Discharge Summary  Alan Riles WUJ:[id number] DOB: 04/15/1941 DOA: 05/14/2016  PCP: [name], MD  Admit date: 05/14/2016 Discharge date: 05/15/2016  Disposition:  Home  Recommendations for Outpatient Follow-up:  1. Follow up with PCP in 1-2 weeks 2. Recommend continue pain management regarding left shoulder pain, likely secondary to osteo-arthritis 3. Monitor progression/resolution of left arm/shoulder rash  Discharge Condition: Stable CODE STATUS: Full Diet recommendation: Heart healthy  Brief/Interim Summary: 75 yo female with history of OA, ITP, HTN, hypothyroidism who came with cc of left arm pain and numbness/tingling that started 2 weeks ago and has been constant, fluctuating in severity associated with left shoulder/neck pain occasionally. She felt her left arm is weaker too. She also has had left leg weakness noticed over the past 2 weeks prior to admission. She also noticed dysphagia and feeling something was stuck in her throat 2 weeks prior to admission that has resolved with feeling of left facial droop that is not apparent to anyone beside herself. No slurred speech. No imbalance but weakness while using her cane. She also had left sided chest pain that started 2-3 days prior to admission with no dyspnea but with cough productive of clear phlegm without fever or chills or wheezing. No N/V/D/C/abd pain/dsyuria. No history of CVA or MI.   Patient was admitted to the medical floor. MRI the brain was unremarkable for acute process. Cervical MRI was performed with possible foraminal encroachment by C5-C6 by disc material which could affect the left C6 nerve root. X-ray of the left shoulder was performed with findings of degenerative changes. Patient was seen by physical therapy with [facility] for no PT follow-up. Patient was medically stable for close outpatient follow-up  Discharge Diagnoses:  Principal Problem:   Arthritis Active Problems:   Essential hypertension   Backache    Chest pain    Discharge Instructions     Medication List    TAKE these medications        acetaminophen 500 MG tablet  Commonly known as:  TYLENOL  Take 1 tablet (500 mg total) by mouth every 6 (six) hours as needed.     amLODipine 10 MG tablet  Commonly known as:  NORVASC  Take 10 mg by mouth 2 (two) times daily. Reported on 05/14/2016     BUTRANS 5 MCG/HR Ptwk patch  Generic drug:  buprenorphine  Place 5 mcg onto the skin once a week. Thursday     cholecalciferol 1000 units tablet  Commonly known as:  VITAMIN D  Take 1,000 Units by mouth daily.     citalopram 20 MG tablet  Commonly known as:  CELEXA  Take 20 mg by mouth daily.     levothyroxine 100 MCG tablet  Commonly known as:  SYNTHROID, LEVOTHROID  Take 100 mcg by mouth daily before breakfast.     loratadine 10 MG tablet  Commonly known as:  CLARITIN  TAKE 1 TABLET (10 MG TOTAL) BY MOUTH DAILY.     losartan 100 MG tablet  Commonly known as:  COZAAR  Take 100 mg by mouth daily.     pantoprazole 40 MG tablet  Commonly known as:  PROTONIX  TAKE 1 TABLET (40 MG TOTAL) BY MOUTH DAILY.     senna 8.6 MG tablet  Commonly known as:  SENOKOT  Take 1 tablet by mouth daily as needed for constipation. Reported on 05/14/2016     tiZANidine 2 MG tablet  Commonly known as:  ZANAFLEX  Take 2 mg by mouth 2 (  two) times daily as needed for muscle spasms.       Follow-up Information    Follow up with Pearson GrippeJames Kim, MD In 2 weeks.   Specialty:  Internal Medicine   Why:  Hospital follow up   Contact information:   [address] [place] [address] [phone]      Allergies  Allergen Reactions  . Codeine Nausea And Vomiting    REACTION: Nausea/vomiting  . Fish Allergy Nausea Only  . Lactose Intolerance (Gi) Diarrhea  . Oxycodone Nausea And Vomiting  . Penicillins     REACTION: headache, tremors, Nausea Has patient had a PCN reaction causing immediate rash, facial/tongue/throat swelling, SOB or  lightheadedness with hypotension: No Has patient had a PCN reaction causing severe rash involving mucus membranes or skin necrosis: No Has patient had a PCN reaction that required hospitalization No Has patient had a PCN reaction occurring within the last 10 years: No If all of the above answers are "NO", then may proceed with Cephalosporin use.   . Sulfonamide Derivatives Swelling    REACTION: mouth swells  . Tramadol Hcl Other (See Comments)    REACTION: GI Intolerance  . Triamcinolone Swelling    REACTION: angio edema    Consultations:     Procedures/Studies: Dg Chest 2 View  05/14/2016  CLINICAL DATA:  Left shoulder pain EXAM: CHEST  2 VIEW COMPARISON:  12/09/2015 FINDINGS: Mild cardiomegaly. Normal vascularity. Clear lungs. No pleural effusion and no pneumothorax. IMPRESSION: No active cardiopulmonary disease. Electronically Signed   By: Jolaine ClickArthur  Hoss M.D.   On: 05/14/2016 10:10   Mr Brain Wo Contrast  05/14/2016  CLINICAL DATA:  Left arm pain, numbness and tingling beginning 2 weeks ago. Some left arm and left leg weakness as well. EXAM: MRI HEAD WITHOUT CONTRAST TECHNIQUE: Multiplanar, multiecho pulse sequences of the brain and surrounding structures were obtained without intravenous contrast. COMPARISON:  None. FINDINGS: Diffusion imaging does not show any acute or subacute infarction. The brainstem and cerebellum are normal. Cerebral hemispheres show minimal punctate foci of T2 and FLAIR signal in the white matter consistent with minimal small vessel disease, less than often seen in healthy individuals of this age. No cortical or large vessel territory infarction. No mass lesion, hemorrhage, hydrocephalus or extra-axial collection. Subarachnoid spaces are prominent due to age related volume loss. No pituitary mass. No inflammatory sinus disease. No skull or skullbase lesion. Major vessels at the base of the brain show flow. IMPRESSION: No acute finding or specific explanation for the  presenting symptoms. Age related atrophy and minimal small vessel change of the cerebral hemispheric white matter. Electronically Signed   By: Paulina FusiMark  Shogry M.D.   On: 05/14/2016 16:40   Mr Cervical Spine Wo Contrast  05/14/2016  CLINICAL DATA:  Left arm numbness tingling in weakness. Symptoms of 2 weeks duration. EXAM: MRI CERVICAL SPINE WITHOUT CONTRAST TECHNIQUE: Multiplanar, multisequence MR imaging of the cervical spine was performed. No intravenous contrast was administered. COMPARISON:  None. FINDINGS: The examination suffers from considerable motion degradation. Alignment: Normal Vertebrae: No fracture or significant focal lesion. Insignificant lipoma posteriorly within the C7 vertebral body. Cord: No cord compression or cord lesion. Posterior Fossa, vertebral arteries, paraspinal tissues: Normal Disc levels: Foramen magnum and C1-2 are normal. C2-3:  Normal C3-4: Mild uncovertebral hypertrophy. No canal or foraminal stenosis. C4-5:  Normal. C5-6: Shallow protrusion of the disc, focally prominent in the left foraminal region. This could possibly affect the left C6 nerve root. C6-7:  Minimal uncovertebral  hypertrophy.  No stenosis. C7-T1:  Minimal facet degeneration.  No stenosis. IMPRESSION: Significant motion degradation. I think there is left foraminal encroachment at C5-6 by disc material that could affect the left C6 nerve root. Because of the motion degradation, this is not clearly demonstrated. The may be necessary for the exam to be repeated with sedation or pain relief. Consideration could also be given to myelography. Electronically Signed   By: Paulina FusiMark  Shogry M.D.   On: 05/14/2016 16:45   Dg Shoulder Left Port  05/15/2016  CLINICAL DATA:  Left shoulder pain for 3 days, no known injury EXAM: LEFT SHOULDER - 1 VIEW COMPARISON:  None. FINDINGS: Three views of the left shoulder submitted. Moderate degenerative changes AC joint. Mild degenerative changes left glenoid. Mild spurring of humeral head. No  acute fracture or subluxation. IMPRESSION: No acute fracture or subluxation. Degenerative changes as described above. Electronically Signed   By: Natasha MeadLiviu  Pop M.D.   On: 05/15/2016 11:19   (Echo, Carotid, EGD, Colonoscopy, ERCP)    Subjective:   Discharge Exam: Filed Vitals:   05/14/16 2100 05/15/16 0440  BP: 146/87 123/69  Pulse: 105 69  Temp: 99 F (37.2 C) 98.6 F (37 C)  Resp: 16 14   Filed Vitals:   05/14/16 1159 05/14/16 1325 05/14/16 2100 05/15/16 0440  BP: 123/86 169/77 146/87 123/69  Pulse: 101 75 105 69  Temp:  98.5 F (36.9 C) 99 F (37.2 C) 98.6 F (37 C)  TempSrc:  Oral Oral Oral  Resp: 16 16 16 14   Height:  5' 5.5" (1.664 m)    Weight:  87.7 kg (193 lb 5.5 oz)    SpO2: 94% 99% 96% 99%    General: Pt is alert, awake, not in acute distress Cardiovascular: RRR, S1/S2 +, no rubs, no gallops Respiratory: CTA bilaterally, no wheezing, no rhonchi Abdominal: Soft, NT, ND, bowel sounds + Extremities: no edema, no cyanosis    The results of significant diagnostics from this hospitalization (including imaging, microbiology, ancillary and laboratory) are listed below for reference.     Microbiology: No results found for this or any previous visit (from the past 240 hour(s)).   Labs: BNP (last 3 results) No results for input(s): BNP in the last 8760 hours. Basic Metabolic Panel:  Recent Labs Lab 05/14/16 1015 05/15/16 0410  NA 138 135  K 3.4* 3.3*  CL 103 103  CO2 28 25  GLUCOSE 90 110*  BUN 17 18  CREATININE 1.16* 1.20*  CALCIUM 10.2 9.8   Liver Function Tests:  Recent Labs Lab 05/14/16 1015 05/15/16 0410  AST 13* 11*  ALT 12* 10*  ALKPHOS 82 73  BILITOT 0.5 0.6  PROT 7.4 6.5  ALBUMIN 3.7 3.3*   No results for input(s): LIPASE, AMYLASE in the last 168 hours. No results for input(s): AMMONIA in the last 168 hours. CBC:  Recent Labs Lab 05/09/16 1203 05/14/16 1015 05/15/16 0410  WBC 7.3 5.7 4.3  NEUTROABS 5.1  --   --   HGB 12.0  11.8* 10.8*  HCT 37.3 36.4 32.7*  MCV 79.4* 78.6 78.2  PLT 241 175 208   Cardiac Enzymes: No results for input(s): CKTOTAL, CKMB, CKMBINDEX, TROPONINI in the last 168 hours. BNP: Invalid input(s): POCBNP CBG: No results for input(s): GLUCAP in the last 168 hours. D-Dimer No results for input(s): DDIMER in the last 72 hours. Hgb A1c No results for input(s): HGBA1C in the last 72 hours. Lipid Profile No results for input(s): CHOL, HDL, LDLCALC,  TRIG, CHOLHDL, LDLDIRECT in the last 72 hours. Thyroid function studies No results for input(s): TSH, T4TOTAL, T3FREE, THYROIDAB in the last 72 hours.  Invalid input(s): FREET3 Anemia work up No results for input(s): VITAMINB12, FOLATE, FERRITIN, TIBC, IRON, RETICCTPCT in the last 72 hours. Urinalysis    Component Value Date/Time   COLORURINE YELLOW 07/03/2011 0632   APPEARANCEUR CLOUDY* 07/03/2011 0632   LABSPEC 1.015 10/14/2013 1041   LABSPEC 1.014 07/03/2011 0632   PHURINE 6.0 10/14/2013 1041   PHURINE 5.5 07/03/2011 0632   GLUCOSEU Negative 10/14/2013 1041   GLUCOSEU NEGATIVE 07/03/2011 0632   HGBUR Moderate 10/14/2013 1041   HGBUR TRACE* 07/03/2011 0632   BILIRUBINUR Negative 10/14/2013 1041   BILIRUBINUR NEGATIVE 07/03/2011 0632   KETONESUR Negative 10/14/2013 1041   KETONESUR NEGATIVE 07/03/2011 0632   PROTEINUR < 30 10/14/2013 1041   PROTEINUR NEGATIVE 07/03/2011 0632   UROBILINOGEN 0.2 10/14/2013 1041   UROBILINOGEN 0.2 07/03/2011 0632   NITRITE Negative 10/14/2013 1041   NITRITE NEGATIVE 07/03/2011 0632   LEUKOCYTESUR Moderate 10/14/2013 1041   LEUKOCYTESUR SMALL* 07/03/2011 1610   Sepsis Labs Invalid input(s): PROCALCITONIN,  WBC,  LACTICIDVEN Microbiology No results found for this or any previous visit (from the past 240 hour(s)).    Ndia Sampath, Scheryl Marten, MD  [facility] 05/15/2016, 4:07 PM  If 7PM-7AM, please contact night-coverage [email or web address] Password [email or web address]

## 2016-05-17 ENCOUNTER — Emergency Department (HOSPITAL_COMMUNITY)
Admission: EM | Admit: 2016-05-17 | Discharge: 2016-05-17 | Disposition: A | Discharge status: Home / Self Care | Payer: Medicare Other | Attending: Emergency Medicine | Admitting: Emergency Medicine

## 2016-05-17 ENCOUNTER — Encounter (HOSPITAL_COMMUNITY): Payer: Self-pay | Admitting: Emergency Medicine

## 2016-05-17 DIAGNOSIS — Z79899 Other long term (current) drug therapy: Secondary | ICD-10-CM | POA: Diagnosis not present

## 2016-05-17 DIAGNOSIS — I10 Essential (primary) hypertension: Secondary | ICD-10-CM | POA: Diagnosis not present

## 2016-05-17 DIAGNOSIS — E785 Hyperlipidemia, unspecified: Secondary | ICD-10-CM | POA: Insufficient documentation

## 2016-05-17 DIAGNOSIS — Z87891 Personal history of nicotine dependence: Secondary | ICD-10-CM | POA: Insufficient documentation

## 2016-05-17 DIAGNOSIS — R21 Rash and other nonspecific skin eruption: Secondary | ICD-10-CM | POA: Diagnosis present

## 2016-05-17 DIAGNOSIS — B029 Zoster without complications: Secondary | ICD-10-CM | POA: Diagnosis not present

## 2016-05-17 DIAGNOSIS — Z96659 Presence of unspecified artificial knee joint: Secondary | ICD-10-CM | POA: Insufficient documentation

## 2016-05-17 MED ORDER — HYDROMORPHONE HCL 1 MG/ML IJ SOLN
1.0000 mg | Freq: Once | INTRAMUSCULAR | Status: AC
  Administered 2016-05-17: 1 mg via INTRAMUSCULAR
  Filled 2016-05-17: qty 1

## 2016-05-17 MED ORDER — VALACYCLOVIR HCL 1 G PO TABS: 1000.0000 mg | ORAL_TABLET | Freq: Three times a day (TID) | ORAL | Status: AC

## 2016-05-17 NOTE — ED Notes (Addendum)
Pt c/o left arm/shoulder pain and rash on her left arm. Pt was recently admitted for left same complaint, but pt states she was not sent home with pain medication.

## 2016-05-17 NOTE — Discharge Instructions (Signed)

## 2016-05-17 NOTE — ED Provider Notes (Signed)
CSN: [id number]     Arrival date & time 05/17/16  1850 History   First MD Initiated Contact with Patient 05/17/16 2052     Chief Complaint  Patient presents with  . Arm Pain  . Rash     (Consider location/radiation/quality/duration/timing/severity/associated sxs/prior Treatment) HPI Comments: Patient here complaining of one-day history of rash to her left shoulder. Rash feels as if her skin is burning and she has developed some vesicles. No fever or chills. No visual changes. No neck pain or photophobia. No severe headache. Recently admitted for left shoulder pain and that admission was reviewed. Diagnosed with osteoarthritis. Pain to her shoulder is unchanged. Denies any symptoms of tingling to her left hand. No coronary symptoms.  Patient is a 75 y.o. female presenting with arm pain and rash. The history is provided by the patient.  Arm Pain  Rash   Past Medical History  Diagnosis Date  . Hypertension   . History of Graves' disease   . Hypercalcemia   . Allergic rhinitis   . Murmur, heart   . Back pain   . Renal insufficiency   . Dyslipidemia   . Osteoarthritis, knee   . Bronchiectasis   . Thoracic aortic aneurysm (HCC)   . Hx of colonic polyp   . Thrombocytopenia, unspecified (HCC) 08/02/2013  . Fatigue 08/02/2013  . 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 Thrush, oral 09/02/2013  . Hyperlipidemia   . GERD (gastroesophageal reflux disease)   . Anxiety   . Dysuria 10/14/2013  . Thrush 10/14/2013  . UTI (lower urinary tract infection) 10/14/2013  . Hypokalemia 11/15/2013  . Thyroid disease    Past Surgical History  Procedure Laterality Date  . Abdominal hysterectomy      b/c fibroids  . Knee arthroplasty Right 10/2007  . Total knee arthroplasty Bilateral   . Appendectomy     Family History  Problem Relation Age of Onset  . Kidney cancer Brother 7360  . Prostate cancer Brother   . Colon cancer Brother   . Breast cancer Maternal Aunt   . Liver cancer  Maternal Uncle   . Colon polyps Brother   . Heart disease Mother   . Irritable bowel syndrome Maternal Aunt   . Kidney disease Mother     on dialysis   Social History  Substance Use Topics  . Smoking status: Former Smoker -- 1.00 packs/day for 10 years    Types: Cigarettes    Quit date: 11/11/1976  . Smokeless tobacco: Never Used  . Alcohol Use: No   OB History    No data available     Review of Systems  Skin: Positive for rash.  All other systems reviewed and are negative.     Allergies  Codeine; Fish allergy; Lactose intolerance (gi); Oxycodone; Penicillins; Sulfonamide derivatives; Tramadol hcl; and Triamcinolone  Home Medications   Prior to Admission medications   Medication Sig Start Date End Date Taking? Authorizing Provider  acetaminophen (TYLENOL) 500 MG tablet Take 1 tablet (500 mg total) by mouth every 6 (six) hours as needed. Patient taking differently: Take 500-1,000 mg by mouth every 6 (six) hours as needed for moderate pain.  12/09/15   Everlene FarrierWilliam Dansie, PA-C  amLODipine (NORVASC) 10 MG tablet Take 10 mg by mouth 2 (two) times daily. Reported on 05/14/2016 01/23/16   Historical Provider, MD  BUTRANS 5 MCG/HR PTWK patch Place 5 mcg onto the skin once a week. Thursday 01/26/16   Historical Provider, MD  cholecalciferol (VITAMIN D) 1000  UNITS tablet Take 1,000 Units by mouth daily.    Historical Provider, MD  citalopram (CELEXA) 20 MG tablet Take 20 mg by mouth daily.    Historical Provider, MD  levothyroxine (SYNTHROID, LEVOTHROID) 100 MCG tablet Take 100 mcg by mouth daily before breakfast.     Historical Provider, MD  loratadine (CLARITIN) 10 MG tablet TAKE 1 TABLET (10 MG TOTAL) BY MOUTH DAILY. 12/25/15   Leslye Peerobert S Byrum, MD  losartan (COZAAR) 100 MG tablet Take 100 mg by mouth daily.    Historical Provider, MD  pantoprazole (PROTONIX) 40 MG tablet TAKE 1 TABLET (40 MG TOTAL) BY MOUTH DAILY. Patient taking differently: Take 40 mg by mouth daily. TAKE 1 TABLET (40 MG  TOTAL) BY MOUTH DAILY. 12/20/15   Bevelyn NgoSarah F Groce, NP  senna (SENOKOT) 8.6 MG tablet Take 1 tablet by mouth daily as needed for constipation. Reported on 05/14/2016    Historical Provider, MD  tiZANidine (ZANAFLEX) 2 MG tablet Take 2 mg by mouth 2 (two) times daily as needed for muscle spasms.  12/25/15   Historical Provider, MD   BP 151/95 mmHg  Pulse 104  Temp(Src) 98.4 F (36.9 C) (Oral)  Resp 16  SpO2 99% Physical Exam  Constitutional: She is oriented to person, place, and time. She appears well-developed and well-nourished.  Non-toxic appearance. No distress.  HENT:  Head: Normocephalic and atraumatic.  Eyes: Conjunctivae, EOM and lids are normal. Pupils are equal, round, and reactive to light.  Neck: Normal range of motion. Neck supple. No tracheal deviation present. No thyroid mass present.  Cardiovascular: Normal rate, regular rhythm and normal heart sounds.  Exam reveals no gallop.   No murmur heard. Pulmonary/Chest: Effort normal and breath sounds normal. No stridor. No respiratory distress. She has no decreased breath sounds. She has no wheezes. She has no rhonchi. She has no rales.  Abdominal: Soft. Normal appearance and bowel sounds are normal. She exhibits no distension. There is no tenderness. There is no rebound and no CVA tenderness.  Musculoskeletal: Normal range of motion. She exhibits no edema or tenderness.       Arms: Neurological: She is alert and oriented to person, place, and time. She has normal strength. No cranial nerve deficit or sensory deficit. GCS eye subscore is 4. GCS verbal subscore is 5. GCS motor subscore is 6.  Skin: Skin is warm and dry. No abrasion and no rash noted.  Psychiatric: She has a normal mood and affect. Her speech is normal and behavior is normal.  Nursing note and vitals reviewed.   ED Course  Procedures (including critical care time) Labs Review Labs Reviewed - No data to display  Imaging Review No results found. I have personally  reviewed and evaluated these images and lab results as part of my medical decision-making.   EKG Interpretation None      MDM   Final diagnoses:  None    She given pain meds here and will be treated for herpes zoster with Valtrex  Lorre NickAnthony Laylynn Campanella, MD 05/17/16 2109

## 2016-05-19 ENCOUNTER — Encounter (HOSPITAL_COMMUNITY): Payer: Self-pay | Admitting: Emergency Medicine

## 2016-05-19 ENCOUNTER — Emergency Department (HOSPITAL_COMMUNITY)
Admission: EM | Admit: 2016-05-19 | Discharge: 2016-05-19 | Disposition: A | Discharge status: Home / Self Care | Payer: Medicare Other | Attending: Emergency Medicine | Admitting: Emergency Medicine

## 2016-05-19 DIAGNOSIS — E785 Hyperlipidemia, unspecified: Secondary | ICD-10-CM | POA: Insufficient documentation

## 2016-05-19 DIAGNOSIS — B029 Zoster without complications: Secondary | ICD-10-CM | POA: Diagnosis not present

## 2016-05-19 DIAGNOSIS — I10 Essential (primary) hypertension: Secondary | ICD-10-CM | POA: Insufficient documentation

## 2016-05-19 DIAGNOSIS — M1990 Unspecified osteoarthritis, unspecified site: Secondary | ICD-10-CM | POA: Insufficient documentation

## 2016-05-19 DIAGNOSIS — Z79899 Other long term (current) drug therapy: Secondary | ICD-10-CM | POA: Diagnosis not present

## 2016-05-19 DIAGNOSIS — Z87891 Personal history of nicotine dependence: Secondary | ICD-10-CM | POA: Insufficient documentation

## 2016-05-19 DIAGNOSIS — M25512 Pain in left shoulder: Secondary | ICD-10-CM | POA: Diagnosis present

## 2016-05-19 DIAGNOSIS — R21 Rash and other nonspecific skin eruption: Secondary | ICD-10-CM | POA: Diagnosis not present

## 2016-05-19 MED ORDER — HYDROCODONE-ACETAMINOPHEN 5-325 MG PO TABS: 1.0000 | ORAL_TABLET | Freq: Four times a day (QID) | ORAL | Status: DC | PRN

## 2016-05-19 MED ORDER — GABAPENTIN 100 MG PO CAPS: 100.0000 mg | ORAL_CAPSULE | Freq: Three times a day (TID) | ORAL | Status: DC

## 2016-05-19 MED ORDER — HYDROMORPHONE HCL 1 MG/ML IJ SOLN
1.0000 mg | Freq: Once | INTRAMUSCULAR | Status: AC
  Administered 2016-05-19: 1 mg via INTRAMUSCULAR
  Filled 2016-05-19: qty 1

## 2016-05-19 MED ORDER — OXYCODONE-ACETAMINOPHEN 5-325 MG PO TABS: 1.0000 | ORAL_TABLET | ORAL | Status: DC | PRN

## 2016-05-19 MED ORDER — ACYCLOVIR 200 MG PO CAPS
400.0000 mg | ORAL_CAPSULE | Freq: Once | ORAL | Status: AC
  Administered 2016-05-19: 400 mg via ORAL
  Filled 2016-05-19: qty 2

## 2016-05-19 NOTE — Discharge Instructions (Signed)
Please fill your medications and take it as appropriately.  Follow up with your doctor for further care.    Shingles Shingles is an infection that causes a painful skin rash and fluid-filled blisters. Shingles is caused by the same virus that causes chickenpox. Shingles only develops in people who:  Have had chickenpox.  Have gotten the chickenpox vaccine. (This is rare.) The first symptoms of shingles may be itching, tingling, or pain in an area on your skin. A rash will follow in a few days or weeks. The rash is usually on one side of the body in a bandlike or beltlike pattern. Over time, the rash turns into fluid-filled blisters that break open, scab over, and dry up. Medicines may:  Help you manage pain.  Help you recover more quickly.  Help to prevent long-term problems. HOME CARE Medicines  Take medicines only as told by your doctor.  Apply an anti-itch or numbing cream to the affected area as told by your doctor. Blister and Rash Care  Take a cool bath or put cool compresses on the area of the rash or blisters as told by your doctor. This may help with pain and itching.  Keep your rash covered with a loose bandage (dressing). Wear loose-fitting clothing.  Keep your rash and blisters clean with mild soap and cool water or as told by your doctor.  Check your rash every day for signs of infection. These include redness, swelling, and pain that lasts or gets worse.  Do not pick your blisters.  Do not scratch your rash. General Instructions  Rest as told by your doctor.  Keep all follow-up visits as told by your doctor. This is important.  Until your blisters scab over, your infection can cause chickenpox in people who have never had it or been vaccinated against it. To prevent this from happening, avoid touching other people or being around other people, especially:  Babies.  Pregnant women.  Children who have eczema.  Elderly people who have  transplants.  People who have chronic illnesses, such as leukemia or AIDS. GET HELP IF:  Your pain does not get better with medicine.  Your pain does not get better after the rash heals.  Your rash looks infected. Signs of infection include:  Redness.  Swelling.  Pain that lasts or gets worse. GET HELP RIGHT AWAY IF:  The rash is on your face or nose.  You have pain in your face, pain around your eye area, or loss of feeling on one side of your face.  You have ear pain or you have ringing in your ear.  You have loss of taste.  Your condition gets worse.   This information is not intended to replace advice given to you by your health care provider. Make sure you discuss any questions you have with your health care provider.   Document Released: 04/15/2008 Document Revised: 11/18/2014 Document Reviewed: 08/09/2014 Elsevier Interactive Patient Education [employer]2016 Elsevier Inc.

## 2016-05-19 NOTE — ED Notes (Signed)
Brought in by EMS from home with c/o arm pain.  Per EMS, pt reported that she was not able to fill her prescriptions that was prescribed last Friday.  Pt was seen here last Friday for evaluation of her blister-filled rashes on her left upper arm.  Pt was subsequently diagnosed with Herpes Zoster and was discharged home with Valtrex.

## 2016-05-19 NOTE — Care Management Note (Signed)
Case Management Note  Patient Details  Name: [name] MRN: [id number] Date of Birth: Mar 17, 1941  Subjective/Objective:     Herpes zoster               Action/Plan: Discharge Planning: AVS reviewed: 1018 NCM spoke to patient and will follow up with PCP to discuss getting [facility]. NCM will fax application to PCP's office. Pt is requesting assistance in the home each day. Pt has Medicaid and should qualify for services. Pt states she has cane at home. Gave permission to contact nephew, [name] # [phone]. Spoke to nephew and he will take pt to get medication post dc from ED.   1830 NCM contacted nephew and states pt did get her medications. States he will contact PCP's office tomorrow to arrange appt. States pt had PCS in the past for three weeks. Explained  PCS paperwork was faxed to PCP's office. States he will follow up tomorrow.    Expected Discharge Date:  05/19/2016             Expected Discharge Plan:  Home/Self Care  In-House Referral:  NA  Discharge planning Services  CM Consult  Post Acute Care Choice:  NA Choice offered to:  NA  DME Arranged:  N/A DME Agency:  NA  HH Arranged:  NA HH Agency:  NA  Status of Service:  Completed, signed off  If discussed at Long Length of Stay Meetings, dates discussed:    Additional Comments:  [name], [name] Plair Ellen, RN 05/19/2016, 8:18 PM

## 2016-05-19 NOTE — ED Provider Notes (Signed)
CSN: [id number]     Arrival date & time 05/19/16  [id number] History   First MD Initiated Contact with Patient 05/19/16 [phone]     Chief Complaint  Patient presents with  . Herpes Zoster     (Consider location/radiation/quality/duration/timing/severity/associated sxs/prior Treatment) HPI   75 year old female, brought here via EMS from home with complaints of left shoulder pain. Patient developed vesicular rash to her left shoulder which has since spread down to her left arm, chest and back for the past 3 days. Described pain as burning 10/10 persistent pain, nothing makes it better or worse. No associated fever or chills, headache, neck pain, or vision changes or light sensitivity.  She was seen 2 days prior for this rash and was diagnosed with Shingle.  She has never had Shingle before.  Pt was prescribed Valtrex.  She have not filled her Valtrex prescription yet due to (my son could not find my script eventhough it was in the back of my car).  She plan to have the medication filled once she left the ER.  Pt is requesting for pain management.    Past Medical History  Diagnosis Date  . Hypertension   . History of Graves' disease   . Hypercalcemia   . Allergic rhinitis   . Murmur, heart   . Back pain   . Renal insufficiency   . Dyslipidemia   . Osteoarthritis, knee   . Bronchiectasis   . Thoracic aortic aneurysm (HCC)   . Hx of colonic polyp   . Thrombocytopenia, unspecified (HCC) 08/02/2013  . Fatigue 08/02/2013  . 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 Thrush, oral 09/02/2013  . Hyperlipidemia   . GERD (gastroesophageal reflux disease)   . Anxiety   . Dysuria 10/14/2013  . Thrush 10/14/2013  . UTI (lower urinary tract infection) 10/14/2013  . Hypokalemia 11/15/2013  . Thyroid disease    Past Surgical History  Procedure Laterality Date  . Abdominal hysterectomy      b/c fibroids  . Knee arthroplasty Right 10/2007  . Total knee arthroplasty Bilateral   . Appendectomy      Family History  Problem Relation Age of Onset  . Kidney cancer Brother 5260  . Prostate cancer Brother   . Colon cancer Brother   . Breast cancer Maternal Aunt   . Liver cancer Maternal Uncle   . Colon polyps Brother   . Heart disease Mother   . Irritable bowel syndrome Maternal Aunt   . Kidney disease Mother     on dialysis   Social History  Substance Use Topics  . Smoking status: Former Smoker -- 1.00 packs/day for 10 years    Types: Cigarettes    Quit date: 11/11/1976  . Smokeless tobacco: Never Used  . Alcohol Use: No   OB History    No data available     Review of Systems  Constitutional: Negative for fever.  Skin: Positive for rash.  All other systems reviewed and are negative.     Allergies  Codeine; Fish allergy; Lactose intolerance (gi); Oxycodone; Penicillins; Sulfonamide derivatives; Tramadol hcl; and Triamcinolone  Home Medications   Prior to Admission medications   Medication Sig Start Date End Date Taking? Authorizing Provider  acetaminophen (TYLENOL) 500 MG tablet Take 1 tablet (500 mg total) by mouth every 6 (six) hours as needed. Patient taking differently: Take 500-1,000 mg by mouth every 6 (six) hours as needed for moderate pain.  12/09/15   Everlene FarrierWilliam Dansie, PA-C  amLODipine (NORVASC) 10 MG  tablet Take 10 mg by mouth 2 (two) times daily. Reported on 05/14/2016 01/23/16   Historical Provider, MD  BUTRANS 5 MCG/HR PTWK patch Place 5 mcg onto the skin once a week. Thursday 01/26/16   Historical Provider, MD  cholecalciferol (VITAMIN D) 1000 UNITS tablet Take 1,000 Units by mouth daily.    Historical Provider, MD  citalopram (CELEXA) 20 MG tablet Take 20 mg by mouth daily.    Historical Provider, MD  levothyroxine (SYNTHROID, LEVOTHROID) 100 MCG tablet Take 100 mcg by mouth daily before breakfast.     Historical Provider, MD  loratadine (CLARITIN) 10 MG tablet TAKE 1 TABLET (10 MG TOTAL) BY MOUTH DAILY. 12/25/15   Leslye Peer, MD  losartan (COZAAR) 100 MG  tablet Take 100 mg by mouth daily.    Historical Provider, MD  pantoprazole (PROTONIX) 40 MG tablet TAKE 1 TABLET (40 MG TOTAL) BY MOUTH DAILY. Patient taking differently: Take 40 mg by mouth daily. TAKE 1 TABLET (40 MG TOTAL) BY MOUTH DAILY. 12/20/15   Bevelyn Ngo, NP  senna (SENOKOT) 8.6 MG tablet Take 1 tablet by mouth daily as needed for constipation. Reported on 05/14/2016    Historical Provider, MD  tiZANidine (ZANAFLEX) 2 MG tablet Take 2 mg by mouth 2 (two) times daily as needed for muscle spasms.  12/25/15   Historical Provider, MD  valACYclovir (VALTREX) 1000 MG tablet Take 1 tablet (1,000 mg total) by mouth 3 (three) times daily. 05/17/16 05/31/16  Lorre Nick, MD   There were no vitals taken for this visit. Physical Exam  Constitutional: She is oriented to person, place, and time. She appears well-developed and well-nourished. No distress.  Elderly African-American female, nontoxic in appearance appeared mildly uncomfortable.  HENT:  Head: Atraumatic.  Mouth/Throat: Oropharynx is clear and moist.  No Hutchinson's sign   Eyes: Conjunctivae and EOM are normal. Pupils are equal, round, and reactive to light.  Neck: Normal range of motion. Neck supple.  Cardiovascular: Normal rate and regular rhythm.   Pulmonary/Chest: Effort normal and breath sounds normal.  Abdominal: Soft. There is no tenderness.  Neurological: She is alert and oriented to person, place, and time.  Skin: Rash (vesicular rash following a dermatomal pattern extending from her L shoulder down her L arm, across left back and L chest) noted.  Psychiatric: She has a normal mood and affect.  Nursing note and vitals reviewed.   ED Course  Procedures (including critical care time)   MDM   Final diagnoses:  Shingles rash    BP 163/95 mmHg  Pulse 93  Temp(Src) 98.3 F (36.8 C) (Oral)  Resp 18  SpO2 100%   7:22 AM Pt here with rash consistent with shingle.  Was seen here for same 2 days ago but did not filled  her Valtrex prescription.  No other concerning finding.  Will provide pain medication, a dose of acyclovir and pt will fill her prescription once d/c.  Care discussed with Dr. Jeraldine Loots.    9:33 AM I was able to get in touch with our case manager who will see pt in the ER and will help with outpt care including home health care if pt qualifies.   Fayrene Helper, PA-C 05/19/16 1026  Gerhard Munch, MD 05/19/16 1539

## 2016-05-19 NOTE — ED Notes (Signed)
Bed: [id number] Expected date:  Expected time:  Means of arrival:  Comments: EMS shingles

## 2016-05-19 NOTE — Progress Notes (Signed)
NCM spoke to patient and will follow up with PCP to discuss getting [facility]. NCM will fax application to PCP's office. Pt is requesting assistance in the home each day. Pt has Medicaid and should qualify for services. Isidoro DonningAlesia Daisean Brodhead RN CCM Case Mgmt phone [phone]

## 2016-05-20 DIAGNOSIS — M4696 Unspecified inflammatory spondylopathy, lumbar region: Secondary | ICD-10-CM | POA: Diagnosis not present

## 2016-05-20 DIAGNOSIS — G894 Chronic pain syndrome: Secondary | ICD-10-CM | POA: Diagnosis not present

## 2016-05-20 DIAGNOSIS — M25519 Pain in unspecified shoulder: Secondary | ICD-10-CM | POA: Diagnosis not present

## 2016-05-20 DIAGNOSIS — M5137 Other intervertebral disc degeneration, lumbosacral region: Secondary | ICD-10-CM | POA: Diagnosis not present

## 2016-05-21 DIAGNOSIS — B029 Zoster without complications: Secondary | ICD-10-CM | POA: Diagnosis not present

## 2016-05-28 DIAGNOSIS — B029 Zoster without complications: Secondary | ICD-10-CM | POA: Diagnosis not present

## 2016-05-28 DIAGNOSIS — R52 Pain, unspecified: Secondary | ICD-10-CM | POA: Diagnosis not present

## 2016-05-31 ENCOUNTER — Encounter: Payer: Self-pay | Admitting: Hematology and Oncology

## 2016-05-31 ENCOUNTER — Other Ambulatory Visit: Payer: Medicare Other

## 2016-05-31 ENCOUNTER — Ambulatory Visit: Payer: Medicare Other | Admitting: Hematology and Oncology

## 2016-06-06 DIAGNOSIS — M25519 Pain in unspecified shoulder: Secondary | ICD-10-CM | POA: Diagnosis not present

## 2016-06-06 DIAGNOSIS — M19019 Primary osteoarthritis, unspecified shoulder: Secondary | ICD-10-CM | POA: Diagnosis not present

## 2016-06-06 DIAGNOSIS — M75100 Unspecified rotator cuff tear or rupture of unspecified shoulder, not specified as traumatic: Secondary | ICD-10-CM | POA: Diagnosis not present

## 2016-06-11 DIAGNOSIS — R52 Pain, unspecified: Secondary | ICD-10-CM | POA: Diagnosis not present

## 2016-06-11 DIAGNOSIS — B029 Zoster without complications: Secondary | ICD-10-CM | POA: Diagnosis not present

## 2016-06-11 DIAGNOSIS — I10 Essential (primary) hypertension: Secondary | ICD-10-CM | POA: Diagnosis not present

## 2016-06-17 DIAGNOSIS — Z96653 Presence of artificial knee joint, bilateral: Secondary | ICD-10-CM | POA: Diagnosis not present

## 2016-06-17 DIAGNOSIS — R2681 Unsteadiness on feet: Secondary | ICD-10-CM | POA: Diagnosis not present

## 2016-06-17 DIAGNOSIS — Z79891 Long term (current) use of opiate analgesic: Secondary | ICD-10-CM | POA: Diagnosis not present

## 2016-06-17 DIAGNOSIS — M810 Age-related osteoporosis without current pathological fracture: Secondary | ICD-10-CM | POA: Diagnosis not present

## 2016-06-17 DIAGNOSIS — B029 Zoster without complications: Secondary | ICD-10-CM | POA: Diagnosis not present

## 2016-06-17 DIAGNOSIS — R531 Weakness: Secondary | ICD-10-CM | POA: Diagnosis not present

## 2016-06-17 DIAGNOSIS — J479 Bronchiectasis, uncomplicated: Secondary | ICD-10-CM | POA: Diagnosis not present

## 2016-06-17 DIAGNOSIS — Z9181 History of falling: Secondary | ICD-10-CM | POA: Diagnosis not present

## 2016-06-17 DIAGNOSIS — I10 Essential (primary) hypertension: Secondary | ICD-10-CM | POA: Diagnosis not present

## 2016-06-17 DIAGNOSIS — Z87891 Personal history of nicotine dependence: Secondary | ICD-10-CM | POA: Diagnosis not present

## 2016-06-19 DIAGNOSIS — Z79899 Other long term (current) drug therapy: Secondary | ICD-10-CM | POA: Diagnosis not present

## 2016-06-19 DIAGNOSIS — G894 Chronic pain syndrome: Secondary | ICD-10-CM | POA: Diagnosis not present

## 2016-06-19 DIAGNOSIS — M75100 Unspecified rotator cuff tear or rupture of unspecified shoulder, not specified as traumatic: Secondary | ICD-10-CM | POA: Diagnosis not present

## 2016-06-19 DIAGNOSIS — Z79891 Long term (current) use of opiate analgesic: Secondary | ICD-10-CM | POA: Diagnosis not present

## 2016-06-19 DIAGNOSIS — M19019 Primary osteoarthritis, unspecified shoulder: Secondary | ICD-10-CM | POA: Diagnosis not present

## 2016-06-19 DIAGNOSIS — M545 Low back pain: Secondary | ICD-10-CM | POA: Diagnosis not present

## 2016-06-20 ENCOUNTER — Emergency Department (HOSPITAL_COMMUNITY)
Admission: EM | Admit: 2016-06-20 | Discharge: 2016-06-20 | Disposition: A | Discharge status: Home / Self Care | Payer: Medicare Other | Attending: Emergency Medicine | Admitting: Emergency Medicine

## 2016-06-20 ENCOUNTER — Encounter (HOSPITAL_COMMUNITY): Payer: Self-pay | Admitting: Emergency Medicine

## 2016-06-20 DIAGNOSIS — R110 Nausea: Secondary | ICD-10-CM | POA: Diagnosis not present

## 2016-06-20 DIAGNOSIS — I10 Essential (primary) hypertension: Secondary | ICD-10-CM | POA: Diagnosis not present

## 2016-06-20 DIAGNOSIS — Z9181 History of falling: Secondary | ICD-10-CM | POA: Diagnosis not present

## 2016-06-20 DIAGNOSIS — Z79899 Other long term (current) drug therapy: Secondary | ICD-10-CM | POA: Diagnosis not present

## 2016-06-20 DIAGNOSIS — E860 Dehydration: Secondary | ICD-10-CM | POA: Insufficient documentation

## 2016-06-20 DIAGNOSIS — E039 Hypothyroidism, unspecified: Secondary | ICD-10-CM | POA: Insufficient documentation

## 2016-06-20 DIAGNOSIS — R52 Pain, unspecified: Secondary | ICD-10-CM | POA: Diagnosis not present

## 2016-06-20 DIAGNOSIS — R531 Weakness: Secondary | ICD-10-CM | POA: Diagnosis not present

## 2016-06-20 DIAGNOSIS — Z79891 Long term (current) use of opiate analgesic: Secondary | ICD-10-CM | POA: Diagnosis not present

## 2016-06-20 DIAGNOSIS — B029 Zoster without complications: Secondary | ICD-10-CM

## 2016-06-20 DIAGNOSIS — N179 Acute kidney failure, unspecified: Secondary | ICD-10-CM | POA: Diagnosis not present

## 2016-06-20 DIAGNOSIS — N390 Urinary tract infection, site not specified: Secondary | ICD-10-CM | POA: Diagnosis not present

## 2016-06-20 DIAGNOSIS — Z87891 Personal history of nicotine dependence: Secondary | ICD-10-CM | POA: Diagnosis not present

## 2016-06-20 DIAGNOSIS — R404 Transient alteration of awareness: Secondary | ICD-10-CM | POA: Diagnosis not present

## 2016-06-20 DIAGNOSIS — R2681 Unsteadiness on feet: Secondary | ICD-10-CM | POA: Diagnosis not present

## 2016-06-20 DIAGNOSIS — J479 Bronchiectasis, uncomplicated: Secondary | ICD-10-CM | POA: Diagnosis not present

## 2016-06-20 DIAGNOSIS — R112 Nausea with vomiting, unspecified: Secondary | ICD-10-CM | POA: Diagnosis not present

## 2016-06-20 DIAGNOSIS — Z86010 Personal history of colonic polyps: Secondary | ICD-10-CM | POA: Insufficient documentation

## 2016-06-20 DIAGNOSIS — E876 Hypokalemia: Secondary | ICD-10-CM

## 2016-06-20 DIAGNOSIS — M810 Age-related osteoporosis without current pathological fracture: Secondary | ICD-10-CM | POA: Diagnosis not present

## 2016-06-20 DIAGNOSIS — Z96653 Presence of artificial knee joint, bilateral: Secondary | ICD-10-CM | POA: Diagnosis not present

## 2016-06-20 LAB — COMPREHENSIVE METABOLIC PANEL
ALBUMIN: 3.5 g/dL (ref 3.5–5.0)
ALT: 9 U/L — AB (ref 14–54)
AST: 14 U/L — AB (ref 15–41)
Alkaline Phosphatase: 76 U/L (ref 38–126)
Anion gap: 7 (ref 5–15)
BUN: 12 mg/dL (ref 6–20)
CHLORIDE: 103 mmol/L (ref 101–111)
CO2: 28 mmol/L (ref 22–32)
CREATININE: 1.28 mg/dL — AB (ref 0.44–1.00)
Calcium: 10.8 mg/dL — ABNORMAL HIGH (ref 8.9–10.3)
GFR calc non Af Amer: 40 mL/min — ABNORMAL LOW (ref >60)
GFR, EST AFRICAN AMERICAN: 46 mL/min — AB (ref >60)
Glucose, Bld: 85 mg/dL (ref 65–99)
Potassium: 2.6 mmol/L — CL (ref 3.5–5.1)
SODIUM: 138 mmol/L (ref 135–145)
Total Bilirubin: 0.9 mg/dL (ref 0.3–1.2)
Total Protein: 6.7 g/dL (ref 6.5–8.1)

## 2016-06-20 LAB — CBC
HCT: 34.2 % — ABNORMAL LOW (ref 36.0–46.0)
Hemoglobin: 11.2 g/dL — ABNORMAL LOW (ref 12.0–15.0)
MCH: 25.6 pg — AB (ref 26.0–34.0)
MCHC: 32.7 g/dL (ref 30.0–36.0)
MCV: 78.1 fL (ref 78.0–100.0)
PLATELETS: 236 10*3/uL (ref 150–400)
RBC: 4.38 MIL/uL (ref 3.87–5.11)
RDW: 18.3 % — ABNORMAL HIGH (ref 11.5–15.5)
WBC: 6.8 10*3/uL (ref 4.0–10.5)

## 2016-06-20 LAB — LIPASE, BLOOD: LIPASE: 15 U/L (ref 11–51)

## 2016-06-20 MED ORDER — POTASSIUM CHLORIDE CRYS ER 20 MEQ PO TBCR
40.0000 meq | EXTENDED_RELEASE_TABLET | Freq: Once | ORAL | Status: AC
  Administered 2016-06-20: 40 meq via ORAL
  Filled 2016-06-20: qty 2

## 2016-06-20 MED ORDER — SODIUM CHLORIDE 0.9 % IV SOLN
INTRAVENOUS | Status: DC
  Administered 2016-06-20: 20:00:00 via INTRAVENOUS

## 2016-06-20 MED ORDER — SODIUM CHLORIDE 0.9 % IV BOLUS (SEPSIS)
500.0000 mL | Freq: Once | INTRAVENOUS | Status: AC
  Administered 2016-06-20: 500 mL via INTRAVENOUS

## 2016-06-20 MED ORDER — ONDANSETRON HCL 8 MG PO TABS: 8.0000 mg | ORAL_TABLET | Freq: Three times a day (TID) | ORAL | 0 refills | Status: DC | PRN

## 2016-06-20 MED ORDER — ONDANSETRON HCL 4 MG/2ML IJ SOLN
4.0000 mg | Freq: Once | INTRAMUSCULAR | Status: AC
  Administered 2016-06-20: 4 mg via INTRAVENOUS
  Filled 2016-06-20: qty 2

## 2016-06-20 MED ORDER — POTASSIUM CHLORIDE CRYS ER 20 MEQ PO TBCR: 20.0000 meq | EXTENDED_RELEASE_TABLET | Freq: Two times a day (BID) | ORAL | 0 refills | Status: DC

## 2016-06-20 NOTE — ED Notes (Signed)
Unable to collect labs patient getting undress

## 2016-06-20 NOTE — ED Notes (Signed)
Patient c/o left sided body aches r/t shingles diagnosis two weeks ago. Also co N/V x3 days and left flank pain.

## 2016-06-20 NOTE — ED Provider Notes (Signed)
[facility]-EMERGENCY DEPT Provider Note   CSN: [id number] Arrival date & time: 06/20/16  1637  First Provider Contact:  First MD Initiated Contact with Patient 06/20/16 1845        History   Chief Complaint No chief complaint on file.   HPI [name] is a 75 y.o. female.  She presents for evaluation of vomiting related to use of morphine, which she is using for left arm pain related to shingles. Today she was at her PCP office, and while there, diagnosed with acute kidney injury and urinary tract infection. She was therefore sent here by private vehicle for evaluation. Patient reports feeling weak and states that she cannot tolerate the morphine because it makes her vomit. She denies diarrhea, fever, chills, cough, shortness of breath or chest pain. She is prescribed MSIR, 15 mg, 4 times a day when necessary pain. She states that the rash on her left arm has improved. There are no other no modifying factors.  HPI  Past Medical History:  Diagnosis Date  . Allergic rhinitis   . Anxiety   . Back pain   . Bronchiectasis   . Dyslipidemia   . Dysuria 10/14/2013  . Fatigue 08/02/2013  . GERD (gastroesophageal reflux disease)   . History of Graves' disease   . Hx of colonic polyp   . Hypercalcemia   . Hyperlipidemia   . Hypertension   . Hypokalemia 11/15/2013  . 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 Murmur, heart   . Osteoarthritis, knee   . Renal insufficiency   . Thoracic aortic aneurysm (HCC)   . Thrombocytopenia, unspecified (HCC) 08/02/2013  . Thrush 10/14/2013  . Thrush, oral 09/02/2013  . Thyroid disease   . UTI (lower urinary tract infection) 10/14/2013    Patient Active Problem List   Diagnosis Date Noted  . Arthritis 05/15/2016  . Chest pain 05/14/2016  . Hypokalemia 11/15/2013  . Dysuria 10/14/2013  . Anemia in chronic illness 09/13/2013  . Family history of malignant neoplasm of gastrointestinal tract 09/13/2013  . ITP (idiopathic thrombocytopenic  purpura) 08/11/2013  . Fatigue 08/02/2013  . COLONIC POLYPS, HX OF 06/19/2010  . COUGH 04/06/2010  . ANEMIA 04/02/2010  . GASTROENTERITIS, HX OF 12/18/2008  . PULMONARY NODULE, LEFT LOWER LOBE 09/02/2007  . GASTROESOPHAGEAL REFLUX DISEASE 09/02/2007  . IRRITABLE BOWEL SYNDROME 09/02/2007  . ANXIETY DISORDER, GENERALIZED 07/14/2007  . HYPOTHYROIDISM NOS 07/09/2007  . DYSLIPIDEMIA 07/09/2007  . HYPERCALCEMIA 07/09/2007  . Essential hypertension 07/09/2007  . ALLERGIC RHINITIS 07/09/2007  . BRONCHIECTASIS 07/09/2007  . RENAL INSUFFICIENCY 07/09/2007  . OSTEOARTHRITIS, KNEES, BILATERAL 07/09/2007  . Backache 07/09/2007  . GRAVE'S DISEASE, HX OF 07/09/2007  . HEART MURMUR, HX OF 07/09/2007    Past Surgical History:  Procedure Laterality Date  . ABDOMINAL HYSTERECTOMY     b/c fibroids  . APPENDECTOMY    . KNEE ARTHROPLASTY Right 10/2007  . TOTAL KNEE ARTHROPLASTY Bilateral     OB History    No data available       Home Medications    Prior to Admission medications   Medication Sig Start Date End Date Taking? Authorizing Provider  acetaminophen (TYLENOL) 500 MG tablet Take 1 tablet (500 mg total) by mouth every 6 (six) hours as needed. Patient taking differently: Take 500-1,000 mg by mouth every 6 (six) hours as needed for moderate pain.  12/09/15  Yes Everlene Farrier, PA-C  amLODipine (NORVASC) 10 MG tablet Take 10 mg by mouth daily. Reported on 05/14/2016 01/23/16  Yes Historical  Provider, MD  BUTRANS 5 MCG/HR PTWK patch Place 5 mcg onto the skin once a week. Thursday 01/26/16  Yes Historical Provider, MD  cholecalciferol (VITAMIN D) 1000 UNITS tablet Take 1,000 Units by mouth daily.   Yes Historical Provider, MD  citalopram (CELEXA) 20 MG tablet Take 20 mg by mouth daily.   Yes Historical Provider, MD  levothyroxine (SYNTHROID, LEVOTHROID) 100 MCG tablet Take 100 mcg by mouth daily before breakfast.    Yes Historical Provider, MD  loratadine (CLARITIN) 10 MG tablet TAKE 1  TABLET (10 MG TOTAL) BY MOUTH DAILY. 12/25/15  Yes Leslye Peerobert S Byrum, MD  losartan (COZAAR) 100 MG tablet Take 100 mg by mouth daily.   Yes Historical Provider, MD  pantoprazole (PROTONIX) 40 MG tablet TAKE 1 TABLET (40 MG TOTAL) BY MOUTH DAILY. Patient taking differently: Take 40 mg by mouth daily. TAKE 1 TABLET (40 MG TOTAL) BY MOUTH DAILY. 12/20/15  Yes Bevelyn NgoSarah F Groce, NP  senna (SENOKOT) 8.6 MG tablet Take 1 tablet by mouth daily as needed for constipation. Reported on 05/14/2016   Yes Historical Provider, MD  tiZANidine (ZANAFLEX) 2 MG tablet Take 2 mg by mouth 2 (two) times daily as needed for muscle spasms.  12/25/15  Yes Historical Provider, MD  ondansetron (ZOFRAN) 8 MG tablet Take 1 tablet (8 mg total) by mouth every 8 (eight) hours as needed for nausea or vomiting. 06/20/16   Mancel BaleElliott Clydie Dillen, MD  potassium chloride SA (K-DUR,KLOR-CON) 20 MEQ tablet Take 1 tablet (20 mEq total) by mouth 2 (two) times daily. 06/20/16   Mancel BaleElliott Michelle Vanhise, MD    Family History Family History  Problem Relation Age of Onset  . Kidney cancer Brother 7360  . Prostate cancer Brother   . Colon cancer Brother   . Breast cancer Maternal Aunt   . Liver cancer Maternal Uncle   . Colon polyps Brother   . Heart disease Mother   . Kidney disease Mother     on dialysis  . Irritable bowel syndrome Maternal Aunt     Social History Social History  Substance Use Topics  . Smoking status: Former Smoker    Packs/day: 1.00    Years: 10.00    Types: Cigarettes    Quit date: 11/11/1976  . Smokeless tobacco: Never Used  . Alcohol use No     Allergies   Cauliflower [brassica oleracea italica]; Codeine; Fish allergy; Lactose intolerance (gi); Morphine and related; Oxycodone; Penicillins; Sulfonamide derivatives; Tramadol hcl; and Triamcinolone   Review of Systems Review of Systems  All other systems reviewed and are negative.    Physical Exam Updated Vital Signs BP 152/84 (BP Location: Right Arm)   Pulse 76   Temp 98.1 F  (36.7 C) (Oral)   Resp 14   Ht 5\' 5"  (1.651 m)   Wt 189 lb (85.7 kg)   SpO2 96%   BMI 31.45 kg/m   Physical Exam  Constitutional: She is oriented to person, place, and time. She appears well-developed. No distress.  Elderly, frail  HENT:  Head: Normocephalic and atraumatic.  Eyes: Conjunctivae and EOM are normal. Pupils are equal, round, and reactive to light.  Neck: Normal range of motion and phonation normal. Neck supple.  Cardiovascular: Normal rate and regular rhythm.   Pulmonary/Chest: Effort normal and breath sounds normal. She exhibits no tenderness.  Abdominal: Soft. She exhibits no distension. There is no tenderness. There is no guarding.  Musculoskeletal: Normal range of motion.  Neurological: She is alert and oriented to person,  place, and time. She exhibits normal muscle tone.  Skin: Skin is warm and dry.  Left arm, rash, appearing only as hypo-pigmented area, consistent with subacute, shingles eruption.  Psychiatric: She has a normal mood and affect. Her behavior is normal. Judgment and thought content normal.  Nursing note and vitals reviewed.    ED Treatments / Results  Labs (all labs ordered are listed, but only abnormal results are displayed) Labs Reviewed  COMPREHENSIVE METABOLIC PANEL - Abnormal; Notable for the following:       Result Value   Potassium 2.6 (*)    Creatinine, Ser 1.28 (*)    Calcium 10.8 (*)    AST 14 (*)    ALT 9 (*)    GFR calc non Af Amer 40 (*)    GFR calc Af Amer 46 (*)    All other components within normal limits  CBC - Abnormal; Notable for the following:    Hemoglobin 11.2 (*)    HCT 34.2 (*)    MCH 25.6 (*)    RDW 18.3 (*)    All other components within normal limits  LIPASE, BLOOD    EKG  EKG Interpretation None       Radiology No results found.  Procedures Procedures (including critical care time)  Medications Ordered in ED Medications  sodium chloride 0.9 % bolus 500 mL (0 mLs Intravenous Stopped 06/20/16  2020)  ondansetron (ZOFRAN) injection 4 mg (4 mg Intravenous Given 06/20/16 1952)  potassium chloride SA (K-DUR,KLOR-CON) CR tablet 40 mEq (40 mEq Oral Given 06/20/16 2147)     Initial Impression / Assessment and Plan / ED Course  I have reviewed the triage vital signs and the nursing notes.  Pertinent labs & imaging results that were available during my care of the patient were reviewed by me and considered in my medical decision making (see chart for details).  Clinical Course    Medications  sodium chloride 0.9 % bolus 500 mL (0 mLs Intravenous Stopped 06/20/16 2020)  ondansetron (ZOFRAN) injection 4 mg (4 mg Intravenous Given 06/20/16 1952)  potassium chloride SA (K-DUR,KLOR-CON) CR tablet 40 mEq (40 mEq Oral Given 06/20/16 2147)    Patient Vitals for the past 24 hrs:  BP Pulse Resp SpO2  06/20/16 2204 152/84 76 14 96 %  06/20/16 2056 157/90 67 16 94 %    At D/C Reevaluation with update and discussion. After initial assessment and treatment, an updated evaluation reveals she remains comfortable. Findings discussed with patient, all questions answered.Mancel Bale L   Final Clinical Impressions(s) / ED Diagnoses   Final diagnoses:  Shingles  Hypokalemia  Nausea  Dehydration    Vomiting d/t morphine analgesia. Incidental low K+. Doubt metabolic instability.Patient has not tolerated usual meds for pain. Isolated left arm pain.   Nursing Notes Reviewed/ Care Coordinated Applicable Imaging Reviewed Interpretation of Laboratory Data incorporated into ED treatment  The patient appears reasonably screened and/or stabilized for discharge and I doubt any other medical condition or other [facility] requiring further screening, evaluation, or treatment in the ED at this time prior to discharge.  Plan: Home Medications- stop MSIR, use Tylenol for pain; Home Treatments- rest; return here if the recommended treatment, does not improve the symptoms; Recommended follow up- PCP 1 week   New  Prescriptions Discharge Medication List as of 06/20/2016  9:45 PM    START taking these medications   Details  ondansetron (ZOFRAN) 8 MG tablet Take 1 tablet (8 mg total) by mouth every 8 (  eight) hours as needed for nausea or vomiting., Starting Thu 06/20/2016, Print    potassium chloride SA (K-DUR,KLOR-CON) 20 MEQ tablet Take 1 tablet (20 mEq total) by mouth 2 (two) times daily., Starting Thu 06/20/2016, Print         Mancel Bale, MD 06/21/16 2035

## 2016-06-20 NOTE — ED Notes (Signed)
Pt instructed on need of urine sample. Pt stated that she is unable to urinate at this time.

## 2016-06-20 NOTE — ED Notes (Signed)
After confirming with Dr.Wentz pt is to be off of contact/airborne precautions.

## 2016-06-20 NOTE — ED Notes (Signed)
Bed: [id number] Expected date: 06/20/16 Expected time:  Means of arrival:  Comments: Triage 1

## 2016-06-20 NOTE — ED Notes (Signed)
Pt nephew Thayer OhmChris notified and he advised he would be coming to transport pt back home.

## 2016-06-20 NOTE — ED Triage Notes (Addendum)
[facility] for generalized weakness, N/V.   Last VS: 182/82, 59hr, 18resp, [id number]

## 2016-06-24 DIAGNOSIS — B029 Zoster without complications: Secondary | ICD-10-CM | POA: Diagnosis not present

## 2016-06-24 DIAGNOSIS — Z9181 History of falling: Secondary | ICD-10-CM | POA: Diagnosis not present

## 2016-06-24 DIAGNOSIS — Z96653 Presence of artificial knee joint, bilateral: Secondary | ICD-10-CM | POA: Diagnosis not present

## 2016-06-24 DIAGNOSIS — J479 Bronchiectasis, uncomplicated: Secondary | ICD-10-CM | POA: Diagnosis not present

## 2016-06-24 DIAGNOSIS — R531 Weakness: Secondary | ICD-10-CM | POA: Diagnosis not present

## 2016-06-24 DIAGNOSIS — R2681 Unsteadiness on feet: Secondary | ICD-10-CM | POA: Diagnosis not present

## 2016-06-24 DIAGNOSIS — Z87891 Personal history of nicotine dependence: Secondary | ICD-10-CM | POA: Diagnosis not present

## 2016-06-24 DIAGNOSIS — Z79891 Long term (current) use of opiate analgesic: Secondary | ICD-10-CM | POA: Diagnosis not present

## 2016-06-24 DIAGNOSIS — I10 Essential (primary) hypertension: Secondary | ICD-10-CM | POA: Diagnosis not present

## 2016-06-24 DIAGNOSIS — M810 Age-related osteoporosis without current pathological fracture: Secondary | ICD-10-CM | POA: Diagnosis not present

## 2016-06-25 ENCOUNTER — Other Ambulatory Visit: Payer: Self-pay | Admitting: Acute Care

## 2016-06-25 DIAGNOSIS — Z79891 Long term (current) use of opiate analgesic: Secondary | ICD-10-CM | POA: Diagnosis not present

## 2016-06-25 DIAGNOSIS — Z96653 Presence of artificial knee joint, bilateral: Secondary | ICD-10-CM | POA: Diagnosis not present

## 2016-06-25 DIAGNOSIS — R2681 Unsteadiness on feet: Secondary | ICD-10-CM | POA: Diagnosis not present

## 2016-06-25 DIAGNOSIS — Z9181 History of falling: Secondary | ICD-10-CM | POA: Diagnosis not present

## 2016-06-25 DIAGNOSIS — B029 Zoster without complications: Secondary | ICD-10-CM | POA: Diagnosis not present

## 2016-06-25 DIAGNOSIS — Z87891 Personal history of nicotine dependence: Secondary | ICD-10-CM | POA: Diagnosis not present

## 2016-06-25 DIAGNOSIS — J479 Bronchiectasis, uncomplicated: Secondary | ICD-10-CM | POA: Diagnosis not present

## 2016-06-25 DIAGNOSIS — M810 Age-related osteoporosis without current pathological fracture: Secondary | ICD-10-CM | POA: Diagnosis not present

## 2016-06-25 DIAGNOSIS — R531 Weakness: Secondary | ICD-10-CM | POA: Diagnosis not present

## 2016-06-25 DIAGNOSIS — I10 Essential (primary) hypertension: Secondary | ICD-10-CM | POA: Diagnosis not present

## 2016-06-27 DIAGNOSIS — R2681 Unsteadiness on feet: Secondary | ICD-10-CM | POA: Diagnosis not present

## 2016-06-27 DIAGNOSIS — Z96653 Presence of artificial knee joint, bilateral: Secondary | ICD-10-CM | POA: Diagnosis not present

## 2016-06-27 DIAGNOSIS — M19019 Primary osteoarthritis, unspecified shoulder: Secondary | ICD-10-CM | POA: Diagnosis not present

## 2016-06-27 DIAGNOSIS — Z9181 History of falling: Secondary | ICD-10-CM | POA: Diagnosis not present

## 2016-06-27 DIAGNOSIS — M810 Age-related osteoporosis without current pathological fracture: Secondary | ICD-10-CM | POA: Diagnosis not present

## 2016-06-27 DIAGNOSIS — Z79891 Long term (current) use of opiate analgesic: Secondary | ICD-10-CM | POA: Diagnosis not present

## 2016-06-27 DIAGNOSIS — M25519 Pain in unspecified shoulder: Secondary | ICD-10-CM | POA: Diagnosis not present

## 2016-06-27 DIAGNOSIS — Z87891 Personal history of nicotine dependence: Secondary | ICD-10-CM | POA: Diagnosis not present

## 2016-06-27 DIAGNOSIS — J479 Bronchiectasis, uncomplicated: Secondary | ICD-10-CM | POA: Diagnosis not present

## 2016-06-27 DIAGNOSIS — B029 Zoster without complications: Secondary | ICD-10-CM | POA: Diagnosis not present

## 2016-06-27 DIAGNOSIS — I10 Essential (primary) hypertension: Secondary | ICD-10-CM | POA: Diagnosis not present

## 2016-06-27 DIAGNOSIS — M75100 Unspecified rotator cuff tear or rupture of unspecified shoulder, not specified as traumatic: Secondary | ICD-10-CM | POA: Diagnosis not present

## 2016-06-27 DIAGNOSIS — R531 Weakness: Secondary | ICD-10-CM | POA: Diagnosis not present

## 2016-06-28 DIAGNOSIS — I10 Essential (primary) hypertension: Secondary | ICD-10-CM | POA: Diagnosis not present

## 2016-06-28 DIAGNOSIS — Z79891 Long term (current) use of opiate analgesic: Secondary | ICD-10-CM | POA: Diagnosis not present

## 2016-06-28 DIAGNOSIS — J479 Bronchiectasis, uncomplicated: Secondary | ICD-10-CM | POA: Diagnosis not present

## 2016-06-28 DIAGNOSIS — Z96653 Presence of artificial knee joint, bilateral: Secondary | ICD-10-CM | POA: Diagnosis not present

## 2016-06-28 DIAGNOSIS — R531 Weakness: Secondary | ICD-10-CM | POA: Diagnosis not present

## 2016-06-28 DIAGNOSIS — R2681 Unsteadiness on feet: Secondary | ICD-10-CM | POA: Diagnosis not present

## 2016-06-28 DIAGNOSIS — M810 Age-related osteoporosis without current pathological fracture: Secondary | ICD-10-CM | POA: Diagnosis not present

## 2016-06-28 DIAGNOSIS — Z87891 Personal history of nicotine dependence: Secondary | ICD-10-CM | POA: Diagnosis not present

## 2016-06-28 DIAGNOSIS — Z9181 History of falling: Secondary | ICD-10-CM | POA: Diagnosis not present

## 2016-06-28 DIAGNOSIS — B029 Zoster without complications: Secondary | ICD-10-CM | POA: Diagnosis not present

## 2016-07-01 DIAGNOSIS — Z9181 History of falling: Secondary | ICD-10-CM | POA: Diagnosis not present

## 2016-07-01 DIAGNOSIS — R2681 Unsteadiness on feet: Secondary | ICD-10-CM | POA: Diagnosis not present

## 2016-07-01 DIAGNOSIS — M810 Age-related osteoporosis without current pathological fracture: Secondary | ICD-10-CM | POA: Diagnosis not present

## 2016-07-01 DIAGNOSIS — Z87891 Personal history of nicotine dependence: Secondary | ICD-10-CM | POA: Diagnosis not present

## 2016-07-01 DIAGNOSIS — J479 Bronchiectasis, uncomplicated: Secondary | ICD-10-CM | POA: Diagnosis not present

## 2016-07-01 DIAGNOSIS — I10 Essential (primary) hypertension: Secondary | ICD-10-CM | POA: Diagnosis not present

## 2016-07-01 DIAGNOSIS — R531 Weakness: Secondary | ICD-10-CM | POA: Diagnosis not present

## 2016-07-01 DIAGNOSIS — Z79891 Long term (current) use of opiate analgesic: Secondary | ICD-10-CM | POA: Diagnosis not present

## 2016-07-01 DIAGNOSIS — Z96653 Presence of artificial knee joint, bilateral: Secondary | ICD-10-CM | POA: Diagnosis not present

## 2016-07-01 DIAGNOSIS — B029 Zoster without complications: Secondary | ICD-10-CM | POA: Diagnosis not present

## 2016-07-02 DIAGNOSIS — Z96653 Presence of artificial knee joint, bilateral: Secondary | ICD-10-CM | POA: Diagnosis not present

## 2016-07-02 DIAGNOSIS — I10 Essential (primary) hypertension: Secondary | ICD-10-CM | POA: Diagnosis not present

## 2016-07-02 DIAGNOSIS — B029 Zoster without complications: Secondary | ICD-10-CM | POA: Diagnosis not present

## 2016-07-02 DIAGNOSIS — R2681 Unsteadiness on feet: Secondary | ICD-10-CM | POA: Diagnosis not present

## 2016-07-02 DIAGNOSIS — Z79891 Long term (current) use of opiate analgesic: Secondary | ICD-10-CM | POA: Diagnosis not present

## 2016-07-02 DIAGNOSIS — Z87891 Personal history of nicotine dependence: Secondary | ICD-10-CM | POA: Diagnosis not present

## 2016-07-02 DIAGNOSIS — R531 Weakness: Secondary | ICD-10-CM | POA: Diagnosis not present

## 2016-07-02 DIAGNOSIS — J479 Bronchiectasis, uncomplicated: Secondary | ICD-10-CM | POA: Diagnosis not present

## 2016-07-02 DIAGNOSIS — Z9181 History of falling: Secondary | ICD-10-CM | POA: Diagnosis not present

## 2016-07-02 DIAGNOSIS — M810 Age-related osteoporosis without current pathological fracture: Secondary | ICD-10-CM | POA: Diagnosis not present

## 2016-07-04 DIAGNOSIS — M1991 Primary osteoarthritis, unspecified site: Secondary | ICD-10-CM | POA: Diagnosis not present

## 2016-07-04 DIAGNOSIS — E039 Hypothyroidism, unspecified: Secondary | ICD-10-CM | POA: Diagnosis not present

## 2016-07-04 DIAGNOSIS — Z79891 Long term (current) use of opiate analgesic: Secondary | ICD-10-CM | POA: Diagnosis not present

## 2016-07-04 DIAGNOSIS — G894 Chronic pain syndrome: Secondary | ICD-10-CM | POA: Diagnosis not present

## 2016-07-04 DIAGNOSIS — E669 Obesity, unspecified: Secondary | ICD-10-CM | POA: Diagnosis not present

## 2016-07-04 DIAGNOSIS — Z9181 History of falling: Secondary | ICD-10-CM | POA: Diagnosis not present

## 2016-07-04 DIAGNOSIS — G629 Polyneuropathy, unspecified: Secondary | ICD-10-CM | POA: Diagnosis not present

## 2016-07-04 DIAGNOSIS — I10 Essential (primary) hypertension: Secondary | ICD-10-CM | POA: Diagnosis not present

## 2016-07-04 DIAGNOSIS — Z79899 Other long term (current) drug therapy: Secondary | ICD-10-CM | POA: Diagnosis not present

## 2016-07-04 DIAGNOSIS — M25519 Pain in unspecified shoulder: Secondary | ICD-10-CM | POA: Diagnosis not present

## 2016-07-04 DIAGNOSIS — M545 Low back pain: Secondary | ICD-10-CM | POA: Diagnosis not present

## 2016-07-04 DIAGNOSIS — M1990 Unspecified osteoarthritis, unspecified site: Secondary | ICD-10-CM | POA: Diagnosis not present

## 2016-07-04 DIAGNOSIS — M6281 Muscle weakness (generalized): Secondary | ICD-10-CM | POA: Diagnosis not present

## 2016-07-09 ENCOUNTER — Encounter: Payer: Self-pay | Admitting: Internal Medicine

## 2016-07-09 ENCOUNTER — Non-Acute Institutional Stay (SKILLED_NURSING_FACILITY): Payer: Medicare Other | Admitting: Internal Medicine

## 2016-07-09 DIAGNOSIS — E876 Hypokalemia: Secondary | ICD-10-CM

## 2016-07-09 DIAGNOSIS — I10 Essential (primary) hypertension: Secondary | ICD-10-CM | POA: Diagnosis not present

## 2016-07-09 DIAGNOSIS — M1990 Unspecified osteoarthritis, unspecified site: Secondary | ICD-10-CM

## 2016-07-09 DIAGNOSIS — G894 Chronic pain syndrome: Secondary | ICD-10-CM | POA: Diagnosis not present

## 2016-07-09 DIAGNOSIS — D638 Anemia in other chronic diseases classified elsewhere: Secondary | ICD-10-CM | POA: Diagnosis not present

## 2016-07-09 DIAGNOSIS — F411 Generalized anxiety disorder: Secondary | ICD-10-CM

## 2016-07-09 DIAGNOSIS — G629 Polyneuropathy, unspecified: Secondary | ICD-10-CM | POA: Diagnosis not present

## 2016-07-09 DIAGNOSIS — E890 Postprocedural hypothyroidism: Secondary | ICD-10-CM

## 2016-07-09 DIAGNOSIS — N183 Chronic kidney disease, stage 3 unspecified: Secondary | ICD-10-CM

## 2016-07-09 NOTE — Progress Notes (Signed)
Patient ID: [name], female   DOB: Jul 29, 1941, 75 y.o.   MRN: [id number]    HISTORY AND PHYSICAL   DATE: 07/09/2016  Location:    [place] Room Number: 105 A Place of Service: SNF (31)   Extended Emergency Contact Information Primary Emergency Contact: Hossain,Lindsey Address: [address]          [place], [place] [address] [place] Phone: [phone] Relation: Nephew Secondary Emergency Contact: Detzel,Chris  [place] Phone: [phone] Relation: Nephew  Advanced Directive information Does patient have an advance directive?: No, Would patient like information on creating an advanced directive?: No - patient declined information FULL CODE Chief Complaint  Patient presents with  . New Admit To SNF    HPI:  75 yo female seen today as a new admission into SNF from home due to deconditioning. She has a hx shingles, ITP, AOCD and HTN. She c/o weakness in LUE with sharp shooting pain. She has taken gabapentin in the past. No other concerns. No nursing issues. Appetite ok. Sleeping well.   HTN - stable on amlodipine and losartan  Anxiety d/o - stable on celexa  ITP - stable. Plts 236K  Hypothyroidism - takes levothyroxine. No recent TSH in EPIC  Hypokalemia - takes Kdur. Last K+ 2.6  Chronic back pain - stable on butrans weekly.  GERD - stable on protonix and prn zofran  Allergic rhinitis - stable on claritin  CKD - stage 3. 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 Ferritin 60  Past Medical History:  Diagnosis Date  . Allergic rhinitis   . Anxiety   . Back pain   . Bronchiectasis   . Dyslipidemia   . Dysuria 10/14/2013  . Fatigue 08/02/2013  . GERD (gastroesophageal reflux disease)   . History of Graves' disease   . Hx of colonic polyp   . Hypercalcemia   . Hyperlipidemia   . Hypertension   . Hypokalemia 11/15/2013  . 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 Murmur, heart     . Osteoarthritis, knee   . Renal insufficiency   . Thoracic aortic aneurysm ([place])   . Thrombocytopenia, unspecified ([place]) 08/02/2013  . Thrush 10/14/2013  . Thrush, oral 09/02/2013  . Thyroid disease   . UTI (lower urinary tract infection) 10/14/2013    Past Surgical History:  Procedure Laterality Date  . ABDOMINAL HYSTERECTOMY     b/c fibroids  . APPENDECTOMY    . KNEE ARTHROPLASTY Right 10/2007  . TOTAL KNEE ARTHROPLASTY Bilateral     Patient Care Team: [name], MD as PCP - General (Internal Medicine) Heath Lark, MD as Consulting Physician (Hematology and Oncology)  Social History   Social History  . Marital status: Single    Spouse name: N/A  . Number of children: N/A  . Years of education: N/A   Occupational History  . Not on file.   Social History Main Topics  . Smoking status: Former Smoker    Packs/day: 1.00    Years: 10.00    Types: Cigarettes    Quit date: 11/11/1976  . Smokeless tobacco: Never Used  . Alcohol use No  . Drug use: No  . Sexual activity: Not on file   Other Topics Concern  . Not on file   Social History Narrative  . No narrative on file     reports that she quit smoking about 39 years ago. Her smoking use included Cigarettes. She has a  10.00 pack-year smoking history. She has never used smokeless tobacco. She reports that she does not drink alcohol or use drugs.  Family History  Problem Relation Age of Onset  . Kidney cancer Brother 58  . Prostate cancer Brother   . Colon cancer Brother   . Breast cancer Maternal Aunt   . Liver cancer Maternal Uncle   . Colon polyps Brother   . Heart disease Mother   . Kidney disease Mother     on dialysis  . Irritable bowel syndrome Maternal Aunt    Family Status  Relation Status  . Brother   . Brother   . Brother   . Maternal Aunt   . Maternal Uncle   . Brother   . Mother   . Maternal Aunt     Immunization History  Administered Date(s) Administered  . Influenza Whole 12/13/2003,  10/25/2009  . Influenza-Unspecified 08/31/2014  . PPD Test 07/04/2016  . Pneumococcal Polysaccharide-23 04/11/2006  . Td 10/25/2009    Allergies  Allergen Reactions  . Cauliflower [Brassica Oleracea Italica]     Showed up on allergy test  . Codeine Nausea And Vomiting    REACTION: Nausea/vomiting  . Fish Allergy Nausea Only  . Lactose Intolerance (Gi) Diarrhea  . Morphine And Related Nausea And Vomiting  . Oxycodone Nausea And Vomiting  . Penicillins     REACTION: headache, tremors, Nausea Has patient had a PCN reaction causing immediate rash, facial/tongue/throat swelling, SOB or lightheadedness with hypotension: No Has patient had a PCN reaction causing severe rash involving mucus membranes or skin necrosis: No Has patient had a PCN reaction that required hospitalization No Has patient had a PCN reaction occurring within the last 10 years: No If all of the above answers are "NO", then may proceed with Cephalosporin use.   . Sulfonamide Derivatives Swelling    REACTION: mouth swells  . Tramadol Hcl Other (See Comments)    REACTION: GI Intolerance  . Triamcinolone Swelling    REACTION: angio edema    Medications: Patient's Medications  New Prescriptions   No medications on file  Previous Medications   ACETAMINOPHEN (TYLENOL) 500 MG TABLET    Take 500 mg by mouth every 6 (six) hours as needed.   AMLODIPINE (NORVASC) 5 MG TABLET    Take 5 mg by mouth 2 (two) times daily. Reported on 05/14/2016   BUTRANS 5 MCG/HR PTWK PATCH    Place 5 mcg onto the skin once a week. Thursday   CHOLECALCIFEROL (VITAMIN D) 1000 UNITS TABLET    Take 1,000 Units by mouth daily.   CITALOPRAM (CELEXA) 20 MG TABLET    Take 20 mg by mouth daily.   GABAPENTIN (NEURONTIN) 100 MG CAPSULE    Take 100 mg by mouth 3 (three) times daily.   LEVOTHYROXINE (SYNTHROID, LEVOTHROID) 100 MCG TABLET    Take 100 mcg by mouth daily before breakfast.    LORATADINE (CLARITIN) 10 MG TABLET    TAKE 1 TABLET (10 MG TOTAL)  BY MOUTH DAILY.   LOSARTAN (COZAAR) 100 MG TABLET    Take 100 mg by mouth daily.   ONDANSETRON (ZOFRAN) 8 MG TABLET    Take 1 tablet (8 mg total) by mouth every 8 (eight) hours as needed for nausea or vomiting.   PANTOPRAZOLE (PROTONIX) 40 MG TABLET    TAKE 1 TABLET BY MOUTH DAILY   POTASSIUM CHLORIDE SA (K-DUR,KLOR-CON) 20 MEQ TABLET    Take 1 tablet (20 mEq total) by mouth 2 (two) times  daily.  Modified Medications   No medications on file  Discontinued Medications   ACETAMINOPHEN (TYLENOL) 500 MG TABLET    Take 1 tablet (500 mg total) by mouth every 6 (six) hours as needed.   SENNA (SENOKOT) 8.6 MG TABLET    Take 1 tablet by mouth daily as needed for constipation. Reported on 05/14/2016   TIZANIDINE (ZANAFLEX) 2 MG TABLET    Take 2 mg by mouth 2 (two) times daily as needed for muscle spasms.     Review of Systems  Musculoskeletal: Positive for arthralgias.  Neurological: Positive for weakness.  All other systems reviewed and are negative.   Vitals:   07/09/16 1322  BP: 132/68  Pulse: 82  Resp: 20  Temp: 97.8 F (36.6 C)  TempSrc: Oral  SpO2: 95%  Weight: 189 lb (85.7 kg)  Height: 5' 5.6" (1.666 m)   Body mass index is 30.88 kg/m.  Physical Exam  Constitutional: She is oriented to person, place, and time. She appears well-developed and well-nourished.  Sitting on edge of bed in NAD  HENT:  Mouth/Throat: Oropharynx is clear and moist. No oropharyngeal exudate.  MMM  Eyes: Pupils are equal, round, and reactive to light. No scleral icterus.  Neck: Neck supple. Carotid bruit is not present. No tracheal deviation present. No thyromegaly present.  Cardiovascular: Normal rate, regular rhythm and intact distal pulses.  Exam reveals no gallop and no friction rub.   Murmur (1/6 SEM) heard. No LE edema b/l. no calf TTP.   Pulmonary/Chest: Effort normal and breath sounds normal. No stridor. No respiratory distress. She has no wheezes. She has no rales. She exhibits no tenderness.    Abdominal: Soft. Bowel sounds are normal. She exhibits no distension and no mass. There is no hepatomegaly. There is no tenderness. There is no rebound and no guarding.  Musculoskeletal: She exhibits edema and tenderness (LUE).  Lymphadenopathy:    She has no cervical adenopathy.  Neurological: She is alert and oriented to person, place, and time.  Strength is 4/5 in LUE; otherwise intact  Skin: Skin is warm and dry. No rash noted.  Psychiatric: She has a normal mood and affect. Her behavior is normal. Judgment and thought content normal.     Labs reviewed: Admission on 06/20/2016, Discharged on 06/20/2016  Component Date Value Ref Range Status  . Lipase 06/20/2016 15  11 - 51 U/L Final  . Sodium 06/20/2016 138  135 - 145 mmol/L Final  . Potassium 06/20/2016 2.6* 3.5 - 5.1 mmol/L Final   Comment: CRITICAL RESULT CALLED TO, READ BACK BY AND VERIFIED WITH: COGGIN,I AT 2009 ON 06/20/16 BY MOSLEY,J   . Chloride 06/20/2016 103  101 - 111 mmol/L Final  . CO2 06/20/2016 28  22 - 32 mmol/L Final  . Glucose, Bld 06/20/2016 85  65 - 99 mg/dL Final  . BUN 06/20/2016 12  6 - 20 mg/dL Final  . Creatinine, Ser 06/20/2016 1.28* 0.44 - 1.00 mg/dL Final  . Calcium 06/20/2016 10.8* 8.9 - 10.3 mg/dL Final  . Total Protein 06/20/2016 6.7  6.5 - 8.1 g/dL Final  . Albumin 06/20/2016 3.5  3.5 - 5.0 g/dL Final  . AST 06/20/2016 14* 15 - 41 U/L Final  . ALT 06/20/2016 9* 14 - 54 U/L Final  . Alkaline Phosphatase 06/20/2016 76  38 - 126 U/L Final  . Total Bilirubin 06/20/2016 0.9  0.3 - 1.2 mg/dL Final  . GFR calc non Af Amer 06/20/2016 40* >60 mL/min Final  .  GFR calc Af Amer 06/20/2016 46* >60 mL/min Final   Comment: (NOTE) The eGFR has been calculated using the CKD EPI equation. This calculation has not been validated in all clinical situations. eGFR's persistently <60 mL/min signify possible Chronic Kidney Disease.   . Anion gap 06/20/2016 7  5 - 15 Final  . WBC 06/20/2016 6.8  4.0 - 10.5 K/uL  Final  . RBC 06/20/2016 4.38  3.87 - 5.11 MIL/uL Final  . Hemoglobin 06/20/2016 11.2* 12.0 - 15.0 g/dL Final  . HCT 06/20/2016 34.2* 36.0 - 46.0 % Final  . MCV 06/20/2016 78.1  78.0 - 100.0 fL Final  . MCH 06/20/2016 25.6* 26.0 - 34.0 pg Final  . MCHC 06/20/2016 32.7  30.0 - 36.0 g/dL Final  . RDW 06/20/2016 18.3* 11.5 - 15.5 % Final  . Platelets 06/20/2016 236  150 - 400 K/uL Final  Admission on 05/14/2016, Discharged on 05/15/2016  Component Date Value Ref Range Status  . WBC 05/14/2016 5.7  4.0 - 10.5 K/uL Final  . RBC 05/14/2016 4.63  3.87 - 5.11 MIL/uL Final  . Hemoglobin 05/14/2016 11.8* 12.0 - 15.0 g/dL Final  . HCT 05/14/2016 36.4  36.0 - 46.0 % Final  . MCV 05/14/2016 78.6  78.0 - 100.0 fL Final  . MCH 05/14/2016 25.5* 26.0 - 34.0 pg Final  . MCHC 05/14/2016 32.4  30.0 - 36.0 g/dL Final  . RDW 05/14/2016 16.8* 11.5 - 15.5 % Final  . Platelets 05/14/2016 175  150 - 400 K/uL Final  . Troponin i, poc 05/14/2016 0.01  0.00 - 0.08 ng/mL Final  . Comment 3 05/14/2016          Final   Comment: Due to the release kinetics of cTnI, a negative result within the first hours of the onset of symptoms does not rule out myocardial infarction with certainty. If myocardial infarction is still suspected, repeat the test at appropriate intervals.   . Sodium 05/14/2016 138  135 - 145 mmol/L Final  . Potassium 05/14/2016 3.4* 3.5 - 5.1 mmol/L Final  . Chloride 05/14/2016 103  101 - 111 mmol/L Final  . CO2 05/14/2016 28  22 - 32 mmol/L Final  . Glucose, Bld 05/14/2016 90  65 - 99 mg/dL Final  . BUN 05/14/2016 17  6 - 20 mg/dL Final  . Creatinine, Ser 05/14/2016 1.16* 0.44 - 1.00 mg/dL Final  . Calcium 05/14/2016 10.2  8.9 - 10.3 mg/dL Final  . Total Protein 05/14/2016 7.4  6.5 - 8.1 g/dL Final  . Albumin 05/14/2016 3.7  3.5 - 5.0 g/dL Final  . AST 05/14/2016 13* 15 - 41 U/L Final  . ALT 05/14/2016 12* 14 - 54 U/L Final  . Alkaline Phosphatase 05/14/2016 82  38 - 126 U/L Final  . Total  Bilirubin 05/14/2016 0.5  0.3 - 1.2 mg/dL Final  . GFR calc non Af Amer 05/14/2016 45* >60 mL/min Final  . GFR calc Af Amer 05/14/2016 52* >60 mL/min Final   Comment: (NOTE) The eGFR has been calculated using the CKD EPI equation. This calculation has not been validated in all clinical situations. eGFR's persistently <60 mL/min signify possible Chronic Kidney Disease.   . Anion gap 05/14/2016 7  5 - 15 Final  . Sodium 05/15/2016 135  135 - 145 mmol/L Final  . Potassium 05/15/2016 3.3* 3.5 - 5.1 mmol/L Final  . Chloride 05/15/2016 103  101 - 111 mmol/L Final  . CO2 05/15/2016 25  22 - 32 mmol/L Final  .  Glucose, Bld 05/15/2016 110* 65 - 99 mg/dL Final  . BUN 05/15/2016 18  6 - 20 mg/dL Final  . Creatinine, Ser 05/15/2016 1.20* 0.44 - 1.00 mg/dL Final  . Calcium 05/15/2016 9.8  8.9 - 10.3 mg/dL Final  . Total Protein 05/15/2016 6.5  6.5 - 8.1 g/dL Final  . Albumin 05/15/2016 3.3* 3.5 - 5.0 g/dL Final  . AST 05/15/2016 11* 15 - 41 U/L Final  . ALT 05/15/2016 10* 14 - 54 U/L Final  . Alkaline Phosphatase 05/15/2016 73  38 - 126 U/L Final  . Total Bilirubin 05/15/2016 0.6  0.3 - 1.2 mg/dL Final  . GFR calc non Af Amer 05/15/2016 43* >60 mL/min Final  . GFR calc Af Amer 05/15/2016 50* >60 mL/min Final   Comment: (NOTE) The eGFR has been calculated using the CKD EPI equation. This calculation has not been validated in all clinical situations. eGFR's persistently <60 mL/min signify possible Chronic Kidney Disease.   . Anion gap 05/15/2016 7  5 - 15 Final  . WBC 05/15/2016 4.3  4.0 - 10.5 K/uL Final  . RBC 05/15/2016 4.18  3.87 - 5.11 MIL/uL Final  . Hemoglobin 05/15/2016 10.8* 12.0 - 15.0 g/dL Final  . HCT 05/15/2016 32.7* 36.0 - 46.0 % Final  . MCV 05/15/2016 78.2  78.0 - 100.0 fL Final  . MCH 05/15/2016 25.8* 26.0 - 34.0 pg Final  . MCHC 05/15/2016 33.0  30.0 - 36.0 g/dL Final  . RDW 05/15/2016 16.7* 11.5 - 15.5 % Final  . Platelets 05/15/2016 208  150 - 400 K/uL Final    Appointment on 05/09/2016  Component Date Value Ref Range Status  . WBC 05/09/2016 7.3  3.9 - 10.3 10e3/uL Final  . NEUT# 05/09/2016 5.1  1.5 - 6.5 10e3/uL Final  . HGB 05/09/2016 12.0  11.6 - 15.9 g/dL Final  . HCT 05/09/2016 37.3  34.8 - 46.6 % Final  . Platelets 05/09/2016 241  145 - 400 10e3/uL Final  . MCV 05/09/2016 79.4* 79.5 - 101.0 fL Final  . MCH 05/09/2016 25.6  25.1 - 34.0 pg Final  . MCHC 05/09/2016 32.2  31.5 - 36.0 g/dL Final  . RBC 05/09/2016 4.69  3.70 - 5.45 10e6/uL Final  . RDW 05/09/2016 18.1* 11.2 - 14.5 % Final  . lymph# 05/09/2016 1.5  0.9 - 3.3 10e3/uL Final  . MONO# 05/09/2016 0.5  0.1 - 0.9 10e3/uL Final  . Eosinophils Absolute 05/09/2016 0.2  0.0 - 0.5 10e3/uL Final  . Basophils Absolute 05/09/2016 0.1  0.0 - 0.1 10e3/uL Final  . NEUT% 05/09/2016 69.7  38.4 - 76.8 % Final  . LYMPH% 05/09/2016 19.9  14.0 - 49.7 % Final  . MONO% 05/09/2016 7.2  0.0 - 14.0 % Final  . EOS% 05/09/2016 2.2  0.0 - 7.0 % Final  . BASO% 05/09/2016 1.0  0.0 - 2.0 % Final    No results found.   Assessment/Plan   ICD-9-CM ICD-10-CM   1. Neuropathy (HCC) 355.9 G62.9   2. Chronic pain syndrome 338.4 G89.4   3. Arthritis 716.90 M19.90   4. Essential hypertension 401.9 I10   5. ANXIETY DISORDER, GENERALIZED 300.02 F41.1   6. 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 CKD (chronic kidney disease) stage 3, GFR 30-59 ml/min 585.3 N18.3   9. Anemia of chronic disease 285.29 D63.8      Start gabapentin 166m TID  Cont other meds as ordered  Will need TSH, free T4 as  no recent one in chart. Will need to repeat bmp to follow K+  PT/OT/ST as ordered  Nutritional supplements as indicated  GOAL: short term rehab with potential for long term care. Communicated with pt and nursing.  Will follow  Jamica Woodyard S. Perlie Gold  [facility] [address] [place], [place] [address] [phone] Cell  (Monday-Friday 8 AM - 5 PM) [phone] After 5 PM and follow prompts

## 2016-07-17 DIAGNOSIS — E669 Obesity, unspecified: Secondary | ICD-10-CM | POA: Diagnosis not present

## 2016-07-17 DIAGNOSIS — M25519 Pain in unspecified shoulder: Secondary | ICD-10-CM | POA: Diagnosis not present

## 2016-07-17 DIAGNOSIS — Z79899 Other long term (current) drug therapy: Secondary | ICD-10-CM | POA: Diagnosis not present

## 2016-07-17 DIAGNOSIS — G894 Chronic pain syndrome: Secondary | ICD-10-CM | POA: Diagnosis not present

## 2016-07-17 DIAGNOSIS — M545 Low back pain: Secondary | ICD-10-CM | POA: Diagnosis not present

## 2016-07-17 DIAGNOSIS — Z79891 Long term (current) use of opiate analgesic: Secondary | ICD-10-CM | POA: Diagnosis not present

## 2016-07-19 ENCOUNTER — Other Ambulatory Visit: Payer: Self-pay | Admitting: *Deleted

## 2016-07-19 MED ORDER — BUTRANS 5 MCG/HR TD PTWK: 5.0000 ug | MEDICATED_PATCH | TRANSDERMAL | 0 refills | Status: DC

## 2016-07-19 NOTE — Telephone Encounter (Signed)
[facility] 

## 2016-07-27 DIAGNOSIS — Z87891 Personal history of nicotine dependence: Secondary | ICD-10-CM | POA: Diagnosis not present

## 2016-07-27 DIAGNOSIS — M810 Age-related osteoporosis without current pathological fracture: Secondary | ICD-10-CM | POA: Diagnosis not present

## 2016-07-27 DIAGNOSIS — Z79891 Long term (current) use of opiate analgesic: Secondary | ICD-10-CM | POA: Diagnosis not present

## 2016-07-27 DIAGNOSIS — Z96653 Presence of artificial knee joint, bilateral: Secondary | ICD-10-CM | POA: Diagnosis not present

## 2016-07-27 DIAGNOSIS — I10 Essential (primary) hypertension: Secondary | ICD-10-CM | POA: Diagnosis not present

## 2016-07-27 DIAGNOSIS — Z9181 History of falling: Secondary | ICD-10-CM | POA: Diagnosis not present

## 2016-07-27 DIAGNOSIS — B029 Zoster without complications: Secondary | ICD-10-CM | POA: Diagnosis not present

## 2016-07-27 DIAGNOSIS — J479 Bronchiectasis, uncomplicated: Secondary | ICD-10-CM | POA: Diagnosis not present

## 2016-07-27 DIAGNOSIS — R2681 Unsteadiness on feet: Secondary | ICD-10-CM | POA: Diagnosis not present

## 2016-07-27 DIAGNOSIS — R531 Weakness: Secondary | ICD-10-CM | POA: Diagnosis not present

## 2016-07-31 DIAGNOSIS — R531 Weakness: Secondary | ICD-10-CM | POA: Diagnosis not present

## 2016-07-31 DIAGNOSIS — B029 Zoster without complications: Secondary | ICD-10-CM | POA: Diagnosis not present

## 2016-07-31 DIAGNOSIS — Z87891 Personal history of nicotine dependence: Secondary | ICD-10-CM | POA: Diagnosis not present

## 2016-07-31 DIAGNOSIS — Z96653 Presence of artificial knee joint, bilateral: Secondary | ICD-10-CM | POA: Diagnosis not present

## 2016-07-31 DIAGNOSIS — I10 Essential (primary) hypertension: Secondary | ICD-10-CM | POA: Diagnosis not present

## 2016-07-31 DIAGNOSIS — Z79891 Long term (current) use of opiate analgesic: Secondary | ICD-10-CM | POA: Diagnosis not present

## 2016-07-31 DIAGNOSIS — J479 Bronchiectasis, uncomplicated: Secondary | ICD-10-CM | POA: Diagnosis not present

## 2016-07-31 DIAGNOSIS — R2681 Unsteadiness on feet: Secondary | ICD-10-CM | POA: Diagnosis not present

## 2016-07-31 DIAGNOSIS — Z9181 History of falling: Secondary | ICD-10-CM | POA: Diagnosis not present

## 2016-07-31 DIAGNOSIS — M810 Age-related osteoporosis without current pathological fracture: Secondary | ICD-10-CM | POA: Diagnosis not present

## 2016-08-01 DIAGNOSIS — Z96653 Presence of artificial knee joint, bilateral: Secondary | ICD-10-CM | POA: Diagnosis not present

## 2016-08-01 DIAGNOSIS — Z9181 History of falling: Secondary | ICD-10-CM | POA: Diagnosis not present

## 2016-08-01 DIAGNOSIS — R531 Weakness: Secondary | ICD-10-CM | POA: Diagnosis not present

## 2016-08-01 DIAGNOSIS — Z87891 Personal history of nicotine dependence: Secondary | ICD-10-CM | POA: Diagnosis not present

## 2016-08-01 DIAGNOSIS — B029 Zoster without complications: Secondary | ICD-10-CM | POA: Diagnosis not present

## 2016-08-01 DIAGNOSIS — Z79891 Long term (current) use of opiate analgesic: Secondary | ICD-10-CM | POA: Diagnosis not present

## 2016-08-01 DIAGNOSIS — M810 Age-related osteoporosis without current pathological fracture: Secondary | ICD-10-CM | POA: Diagnosis not present

## 2016-08-01 DIAGNOSIS — J479 Bronchiectasis, uncomplicated: Secondary | ICD-10-CM | POA: Diagnosis not present

## 2016-08-01 DIAGNOSIS — R2681 Unsteadiness on feet: Secondary | ICD-10-CM | POA: Diagnosis not present

## 2016-08-01 DIAGNOSIS — I10 Essential (primary) hypertension: Secondary | ICD-10-CM | POA: Diagnosis not present

## 2016-08-06 DIAGNOSIS — I10 Essential (primary) hypertension: Secondary | ICD-10-CM | POA: Diagnosis not present

## 2016-08-06 DIAGNOSIS — Z87891 Personal history of nicotine dependence: Secondary | ICD-10-CM | POA: Diagnosis not present

## 2016-08-06 DIAGNOSIS — Z79891 Long term (current) use of opiate analgesic: Secondary | ICD-10-CM | POA: Diagnosis not present

## 2016-08-06 DIAGNOSIS — M79651 Pain in right thigh: Secondary | ICD-10-CM | POA: Diagnosis not present

## 2016-08-06 DIAGNOSIS — Z09 Encounter for follow-up examination after completed treatment for conditions other than malignant neoplasm: Secondary | ICD-10-CM | POA: Diagnosis not present

## 2016-08-06 DIAGNOSIS — Z9181 History of falling: Secondary | ICD-10-CM | POA: Diagnosis not present

## 2016-08-06 DIAGNOSIS — J479 Bronchiectasis, uncomplicated: Secondary | ICD-10-CM | POA: Diagnosis not present

## 2016-08-06 DIAGNOSIS — R5381 Other malaise: Secondary | ICD-10-CM | POA: Diagnosis not present

## 2016-08-06 DIAGNOSIS — B029 Zoster without complications: Secondary | ICD-10-CM | POA: Diagnosis not present

## 2016-08-06 DIAGNOSIS — R2681 Unsteadiness on feet: Secondary | ICD-10-CM | POA: Diagnosis not present

## 2016-08-06 DIAGNOSIS — M79606 Pain in leg, unspecified: Secondary | ICD-10-CM | POA: Diagnosis not present

## 2016-08-06 DIAGNOSIS — R531 Weakness: Secondary | ICD-10-CM | POA: Diagnosis not present

## 2016-08-06 DIAGNOSIS — M810 Age-related osteoporosis without current pathological fracture: Secondary | ICD-10-CM | POA: Diagnosis not present

## 2016-08-06 DIAGNOSIS — Z96653 Presence of artificial knee joint, bilateral: Secondary | ICD-10-CM | POA: Diagnosis not present

## 2016-08-07 DIAGNOSIS — Z96653 Presence of artificial knee joint, bilateral: Secondary | ICD-10-CM | POA: Diagnosis not present

## 2016-08-07 DIAGNOSIS — J479 Bronchiectasis, uncomplicated: Secondary | ICD-10-CM | POA: Diagnosis not present

## 2016-08-07 DIAGNOSIS — R531 Weakness: Secondary | ICD-10-CM | POA: Diagnosis not present

## 2016-08-07 DIAGNOSIS — Z9181 History of falling: Secondary | ICD-10-CM | POA: Diagnosis not present

## 2016-08-07 DIAGNOSIS — Z79891 Long term (current) use of opiate analgesic: Secondary | ICD-10-CM | POA: Diagnosis not present

## 2016-08-07 DIAGNOSIS — I10 Essential (primary) hypertension: Secondary | ICD-10-CM | POA: Diagnosis not present

## 2016-08-07 DIAGNOSIS — B029 Zoster without complications: Secondary | ICD-10-CM | POA: Diagnosis not present

## 2016-08-07 DIAGNOSIS — M810 Age-related osteoporosis without current pathological fracture: Secondary | ICD-10-CM | POA: Diagnosis not present

## 2016-08-07 DIAGNOSIS — R2681 Unsteadiness on feet: Secondary | ICD-10-CM | POA: Diagnosis not present

## 2016-08-07 DIAGNOSIS — Z87891 Personal history of nicotine dependence: Secondary | ICD-10-CM | POA: Diagnosis not present

## 2016-08-08 DIAGNOSIS — Z79891 Long term (current) use of opiate analgesic: Secondary | ICD-10-CM | POA: Diagnosis not present

## 2016-08-08 DIAGNOSIS — I10 Essential (primary) hypertension: Secondary | ICD-10-CM | POA: Diagnosis not present

## 2016-08-08 DIAGNOSIS — M810 Age-related osteoporosis without current pathological fracture: Secondary | ICD-10-CM | POA: Diagnosis not present

## 2016-08-08 DIAGNOSIS — J479 Bronchiectasis, uncomplicated: Secondary | ICD-10-CM | POA: Diagnosis not present

## 2016-08-08 DIAGNOSIS — B029 Zoster without complications: Secondary | ICD-10-CM | POA: Diagnosis not present

## 2016-08-08 DIAGNOSIS — Z96653 Presence of artificial knee joint, bilateral: Secondary | ICD-10-CM | POA: Diagnosis not present

## 2016-08-08 DIAGNOSIS — R531 Weakness: Secondary | ICD-10-CM | POA: Diagnosis not present

## 2016-08-08 DIAGNOSIS — R2681 Unsteadiness on feet: Secondary | ICD-10-CM | POA: Diagnosis not present

## 2016-08-08 DIAGNOSIS — Z9181 History of falling: Secondary | ICD-10-CM | POA: Diagnosis not present

## 2016-08-08 DIAGNOSIS — Z87891 Personal history of nicotine dependence: Secondary | ICD-10-CM | POA: Diagnosis not present

## 2016-08-12 DIAGNOSIS — J479 Bronchiectasis, uncomplicated: Secondary | ICD-10-CM | POA: Diagnosis not present

## 2016-08-12 DIAGNOSIS — I10 Essential (primary) hypertension: Secondary | ICD-10-CM | POA: Diagnosis not present

## 2016-08-12 DIAGNOSIS — R531 Weakness: Secondary | ICD-10-CM | POA: Diagnosis not present

## 2016-08-12 DIAGNOSIS — B029 Zoster without complications: Secondary | ICD-10-CM | POA: Diagnosis not present

## 2016-08-12 DIAGNOSIS — Z96653 Presence of artificial knee joint, bilateral: Secondary | ICD-10-CM | POA: Diagnosis not present

## 2016-08-12 DIAGNOSIS — M810 Age-related osteoporosis without current pathological fracture: Secondary | ICD-10-CM | POA: Diagnosis not present

## 2016-08-12 DIAGNOSIS — Z9181 History of falling: Secondary | ICD-10-CM | POA: Diagnosis not present

## 2016-08-12 DIAGNOSIS — Z79891 Long term (current) use of opiate analgesic: Secondary | ICD-10-CM | POA: Diagnosis not present

## 2016-08-12 DIAGNOSIS — Z87891 Personal history of nicotine dependence: Secondary | ICD-10-CM | POA: Diagnosis not present

## 2016-08-12 DIAGNOSIS — R2681 Unsteadiness on feet: Secondary | ICD-10-CM | POA: Diagnosis not present

## 2016-08-14 DIAGNOSIS — Z87891 Personal history of nicotine dependence: Secondary | ICD-10-CM | POA: Diagnosis not present

## 2016-08-14 DIAGNOSIS — R531 Weakness: Secondary | ICD-10-CM | POA: Diagnosis not present

## 2016-08-14 DIAGNOSIS — Z96653 Presence of artificial knee joint, bilateral: Secondary | ICD-10-CM | POA: Diagnosis not present

## 2016-08-14 DIAGNOSIS — R2681 Unsteadiness on feet: Secondary | ICD-10-CM | POA: Diagnosis not present

## 2016-08-14 DIAGNOSIS — Z9181 History of falling: Secondary | ICD-10-CM | POA: Diagnosis not present

## 2016-08-14 DIAGNOSIS — J479 Bronchiectasis, uncomplicated: Secondary | ICD-10-CM | POA: Diagnosis not present

## 2016-08-14 DIAGNOSIS — I10 Essential (primary) hypertension: Secondary | ICD-10-CM | POA: Diagnosis not present

## 2016-08-14 DIAGNOSIS — M810 Age-related osteoporosis without current pathological fracture: Secondary | ICD-10-CM | POA: Diagnosis not present

## 2016-08-14 DIAGNOSIS — Z79891 Long term (current) use of opiate analgesic: Secondary | ICD-10-CM | POA: Diagnosis not present

## 2016-08-14 DIAGNOSIS — B029 Zoster without complications: Secondary | ICD-10-CM | POA: Diagnosis not present

## 2016-08-15 DIAGNOSIS — J479 Bronchiectasis, uncomplicated: Secondary | ICD-10-CM | POA: Diagnosis not present

## 2016-08-15 DIAGNOSIS — Z96653 Presence of artificial knee joint, bilateral: Secondary | ICD-10-CM | POA: Diagnosis not present

## 2016-08-15 DIAGNOSIS — M810 Age-related osteoporosis without current pathological fracture: Secondary | ICD-10-CM | POA: Diagnosis not present

## 2016-08-15 DIAGNOSIS — Z87891 Personal history of nicotine dependence: Secondary | ICD-10-CM | POA: Diagnosis not present

## 2016-08-15 DIAGNOSIS — Z9181 History of falling: Secondary | ICD-10-CM | POA: Diagnosis not present

## 2016-08-15 DIAGNOSIS — R2681 Unsteadiness on feet: Secondary | ICD-10-CM | POA: Diagnosis not present

## 2016-08-15 DIAGNOSIS — I10 Essential (primary) hypertension: Secondary | ICD-10-CM | POA: Diagnosis not present

## 2016-08-15 DIAGNOSIS — Z79891 Long term (current) use of opiate analgesic: Secondary | ICD-10-CM | POA: Diagnosis not present

## 2016-08-15 DIAGNOSIS — B029 Zoster without complications: Secondary | ICD-10-CM | POA: Diagnosis not present

## 2016-08-15 DIAGNOSIS — R531 Weakness: Secondary | ICD-10-CM | POA: Diagnosis not present

## 2016-08-19 DIAGNOSIS — B029 Zoster without complications: Secondary | ICD-10-CM | POA: Diagnosis not present

## 2016-08-19 DIAGNOSIS — J479 Bronchiectasis, uncomplicated: Secondary | ICD-10-CM | POA: Diagnosis not present

## 2016-08-19 DIAGNOSIS — M810 Age-related osteoporosis without current pathological fracture: Secondary | ICD-10-CM | POA: Diagnosis not present

## 2016-08-19 DIAGNOSIS — R2681 Unsteadiness on feet: Secondary | ICD-10-CM | POA: Diagnosis not present

## 2016-08-19 DIAGNOSIS — Z9181 History of falling: Secondary | ICD-10-CM | POA: Diagnosis not present

## 2016-08-19 DIAGNOSIS — I10 Essential (primary) hypertension: Secondary | ICD-10-CM | POA: Diagnosis not present

## 2016-08-19 DIAGNOSIS — Z87891 Personal history of nicotine dependence: Secondary | ICD-10-CM | POA: Diagnosis not present

## 2016-08-19 DIAGNOSIS — M150 Primary generalized (osteo)arthritis: Secondary | ICD-10-CM | POA: Diagnosis not present

## 2016-08-19 DIAGNOSIS — D649 Anemia, unspecified: Secondary | ICD-10-CM | POA: Diagnosis not present

## 2016-08-19 DIAGNOSIS — E559 Vitamin D deficiency, unspecified: Secondary | ICD-10-CM | POA: Diagnosis not present

## 2016-08-19 DIAGNOSIS — Z96653 Presence of artificial knee joint, bilateral: Secondary | ICD-10-CM | POA: Diagnosis not present

## 2016-08-20 DIAGNOSIS — M810 Age-related osteoporosis without current pathological fracture: Secondary | ICD-10-CM | POA: Diagnosis not present

## 2016-08-20 DIAGNOSIS — Z9181 History of falling: Secondary | ICD-10-CM | POA: Diagnosis not present

## 2016-08-20 DIAGNOSIS — Z96653 Presence of artificial knee joint, bilateral: Secondary | ICD-10-CM | POA: Diagnosis not present

## 2016-08-20 DIAGNOSIS — R2681 Unsteadiness on feet: Secondary | ICD-10-CM | POA: Diagnosis not present

## 2016-08-20 DIAGNOSIS — E559 Vitamin D deficiency, unspecified: Secondary | ICD-10-CM | POA: Diagnosis not present

## 2016-08-20 DIAGNOSIS — I10 Essential (primary) hypertension: Secondary | ICD-10-CM | POA: Diagnosis not present

## 2016-08-20 DIAGNOSIS — M150 Primary generalized (osteo)arthritis: Secondary | ICD-10-CM | POA: Diagnosis not present

## 2016-08-20 DIAGNOSIS — Z87891 Personal history of nicotine dependence: Secondary | ICD-10-CM | POA: Diagnosis not present

## 2016-08-20 DIAGNOSIS — D649 Anemia, unspecified: Secondary | ICD-10-CM | POA: Diagnosis not present

## 2016-08-20 DIAGNOSIS — B029 Zoster without complications: Secondary | ICD-10-CM | POA: Diagnosis not present

## 2016-08-20 DIAGNOSIS — J479 Bronchiectasis, uncomplicated: Secondary | ICD-10-CM | POA: Diagnosis not present

## 2016-08-21 DIAGNOSIS — I10 Essential (primary) hypertension: Secondary | ICD-10-CM | POA: Diagnosis not present

## 2016-08-21 DIAGNOSIS — E559 Vitamin D deficiency, unspecified: Secondary | ICD-10-CM | POA: Diagnosis not present

## 2016-08-21 DIAGNOSIS — R2681 Unsteadiness on feet: Secondary | ICD-10-CM | POA: Diagnosis not present

## 2016-08-21 DIAGNOSIS — M150 Primary generalized (osteo)arthritis: Secondary | ICD-10-CM | POA: Diagnosis not present

## 2016-08-21 DIAGNOSIS — Z87891 Personal history of nicotine dependence: Secondary | ICD-10-CM | POA: Diagnosis not present

## 2016-08-21 DIAGNOSIS — M810 Age-related osteoporosis without current pathological fracture: Secondary | ICD-10-CM | POA: Diagnosis not present

## 2016-08-21 DIAGNOSIS — Z9181 History of falling: Secondary | ICD-10-CM | POA: Diagnosis not present

## 2016-08-21 DIAGNOSIS — D649 Anemia, unspecified: Secondary | ICD-10-CM | POA: Diagnosis not present

## 2016-08-21 DIAGNOSIS — B029 Zoster without complications: Secondary | ICD-10-CM | POA: Diagnosis not present

## 2016-08-21 DIAGNOSIS — J479 Bronchiectasis, uncomplicated: Secondary | ICD-10-CM | POA: Diagnosis not present

## 2016-08-21 DIAGNOSIS — Z96653 Presence of artificial knee joint, bilateral: Secondary | ICD-10-CM | POA: Diagnosis not present

## 2016-08-22 DIAGNOSIS — R2681 Unsteadiness on feet: Secondary | ICD-10-CM | POA: Diagnosis not present

## 2016-08-22 DIAGNOSIS — B029 Zoster without complications: Secondary | ICD-10-CM | POA: Diagnosis not present

## 2016-08-22 DIAGNOSIS — Z87891 Personal history of nicotine dependence: Secondary | ICD-10-CM | POA: Diagnosis not present

## 2016-08-22 DIAGNOSIS — E559 Vitamin D deficiency, unspecified: Secondary | ICD-10-CM | POA: Diagnosis not present

## 2016-08-22 DIAGNOSIS — D649 Anemia, unspecified: Secondary | ICD-10-CM | POA: Diagnosis not present

## 2016-08-22 DIAGNOSIS — Z9181 History of falling: Secondary | ICD-10-CM | POA: Diagnosis not present

## 2016-08-22 DIAGNOSIS — Z96653 Presence of artificial knee joint, bilateral: Secondary | ICD-10-CM | POA: Diagnosis not present

## 2016-08-22 DIAGNOSIS — M150 Primary generalized (osteo)arthritis: Secondary | ICD-10-CM | POA: Diagnosis not present

## 2016-08-22 DIAGNOSIS — M810 Age-related osteoporosis without current pathological fracture: Secondary | ICD-10-CM | POA: Diagnosis not present

## 2016-08-22 DIAGNOSIS — I10 Essential (primary) hypertension: Secondary | ICD-10-CM | POA: Diagnosis not present

## 2016-08-22 DIAGNOSIS — J479 Bronchiectasis, uncomplicated: Secondary | ICD-10-CM | POA: Diagnosis not present

## 2016-08-23 DIAGNOSIS — B029 Zoster without complications: Secondary | ICD-10-CM | POA: Diagnosis not present

## 2016-08-23 DIAGNOSIS — M150 Primary generalized (osteo)arthritis: Secondary | ICD-10-CM | POA: Diagnosis not present

## 2016-08-23 DIAGNOSIS — M810 Age-related osteoporosis without current pathological fracture: Secondary | ICD-10-CM | POA: Diagnosis not present

## 2016-08-23 DIAGNOSIS — J479 Bronchiectasis, uncomplicated: Secondary | ICD-10-CM | POA: Diagnosis not present

## 2016-08-23 DIAGNOSIS — D649 Anemia, unspecified: Secondary | ICD-10-CM | POA: Diagnosis not present

## 2016-08-23 DIAGNOSIS — Z96653 Presence of artificial knee joint, bilateral: Secondary | ICD-10-CM | POA: Diagnosis not present

## 2016-08-23 DIAGNOSIS — Z9181 History of falling: Secondary | ICD-10-CM | POA: Diagnosis not present

## 2016-08-23 DIAGNOSIS — R2681 Unsteadiness on feet: Secondary | ICD-10-CM | POA: Diagnosis not present

## 2016-08-23 DIAGNOSIS — E559 Vitamin D deficiency, unspecified: Secondary | ICD-10-CM | POA: Diagnosis not present

## 2016-08-23 DIAGNOSIS — Z87891 Personal history of nicotine dependence: Secondary | ICD-10-CM | POA: Diagnosis not present

## 2016-08-23 DIAGNOSIS — I10 Essential (primary) hypertension: Secondary | ICD-10-CM | POA: Diagnosis not present

## 2016-08-26 DIAGNOSIS — J479 Bronchiectasis, uncomplicated: Secondary | ICD-10-CM | POA: Diagnosis not present

## 2016-08-26 DIAGNOSIS — R2681 Unsteadiness on feet: Secondary | ICD-10-CM | POA: Diagnosis not present

## 2016-08-26 DIAGNOSIS — D649 Anemia, unspecified: Secondary | ICD-10-CM | POA: Diagnosis not present

## 2016-08-26 DIAGNOSIS — Z9181 History of falling: Secondary | ICD-10-CM | POA: Diagnosis not present

## 2016-08-26 DIAGNOSIS — Z96653 Presence of artificial knee joint, bilateral: Secondary | ICD-10-CM | POA: Diagnosis not present

## 2016-08-26 DIAGNOSIS — E559 Vitamin D deficiency, unspecified: Secondary | ICD-10-CM | POA: Diagnosis not present

## 2016-08-26 DIAGNOSIS — M150 Primary generalized (osteo)arthritis: Secondary | ICD-10-CM | POA: Diagnosis not present

## 2016-08-26 DIAGNOSIS — B029 Zoster without complications: Secondary | ICD-10-CM | POA: Diagnosis not present

## 2016-08-26 DIAGNOSIS — I10 Essential (primary) hypertension: Secondary | ICD-10-CM | POA: Diagnosis not present

## 2016-08-26 DIAGNOSIS — N183 Chronic kidney disease, stage 3 (moderate): Secondary | ICD-10-CM | POA: Diagnosis not present

## 2016-08-26 DIAGNOSIS — Z87891 Personal history of nicotine dependence: Secondary | ICD-10-CM | POA: Diagnosis not present

## 2016-08-26 DIAGNOSIS — M810 Age-related osteoporosis without current pathological fracture: Secondary | ICD-10-CM | POA: Diagnosis not present

## 2016-08-27 DIAGNOSIS — J479 Bronchiectasis, uncomplicated: Secondary | ICD-10-CM | POA: Diagnosis not present

## 2016-08-27 DIAGNOSIS — M150 Primary generalized (osteo)arthritis: Secondary | ICD-10-CM | POA: Diagnosis not present

## 2016-08-27 DIAGNOSIS — Z87891 Personal history of nicotine dependence: Secondary | ICD-10-CM | POA: Diagnosis not present

## 2016-08-27 DIAGNOSIS — E559 Vitamin D deficiency, unspecified: Secondary | ICD-10-CM | POA: Diagnosis not present

## 2016-08-27 DIAGNOSIS — Z96653 Presence of artificial knee joint, bilateral: Secondary | ICD-10-CM | POA: Diagnosis not present

## 2016-08-27 DIAGNOSIS — R2681 Unsteadiness on feet: Secondary | ICD-10-CM | POA: Diagnosis not present

## 2016-08-27 DIAGNOSIS — I10 Essential (primary) hypertension: Secondary | ICD-10-CM | POA: Diagnosis not present

## 2016-08-27 DIAGNOSIS — B029 Zoster without complications: Secondary | ICD-10-CM | POA: Diagnosis not present

## 2016-08-27 DIAGNOSIS — D649 Anemia, unspecified: Secondary | ICD-10-CM | POA: Diagnosis not present

## 2016-08-27 DIAGNOSIS — Z9181 History of falling: Secondary | ICD-10-CM | POA: Diagnosis not present

## 2016-08-27 DIAGNOSIS — M810 Age-related osteoporosis without current pathological fracture: Secondary | ICD-10-CM | POA: Diagnosis not present

## 2016-08-28 DIAGNOSIS — B029 Zoster without complications: Secondary | ICD-10-CM | POA: Diagnosis not present

## 2016-08-28 DIAGNOSIS — E559 Vitamin D deficiency, unspecified: Secondary | ICD-10-CM | POA: Diagnosis not present

## 2016-08-28 DIAGNOSIS — Z87891 Personal history of nicotine dependence: Secondary | ICD-10-CM | POA: Diagnosis not present

## 2016-08-28 DIAGNOSIS — J479 Bronchiectasis, uncomplicated: Secondary | ICD-10-CM | POA: Diagnosis not present

## 2016-08-28 DIAGNOSIS — M810 Age-related osteoporosis without current pathological fracture: Secondary | ICD-10-CM | POA: Diagnosis not present

## 2016-08-28 DIAGNOSIS — R2681 Unsteadiness on feet: Secondary | ICD-10-CM | POA: Diagnosis not present

## 2016-08-28 DIAGNOSIS — I10 Essential (primary) hypertension: Secondary | ICD-10-CM | POA: Diagnosis not present

## 2016-08-28 DIAGNOSIS — M150 Primary generalized (osteo)arthritis: Secondary | ICD-10-CM | POA: Diagnosis not present

## 2016-08-28 DIAGNOSIS — Z96653 Presence of artificial knee joint, bilateral: Secondary | ICD-10-CM | POA: Diagnosis not present

## 2016-08-28 DIAGNOSIS — D649 Anemia, unspecified: Secondary | ICD-10-CM | POA: Diagnosis not present

## 2016-08-28 DIAGNOSIS — Z9181 History of falling: Secondary | ICD-10-CM | POA: Diagnosis not present

## 2016-08-30 DIAGNOSIS — Z96653 Presence of artificial knee joint, bilateral: Secondary | ICD-10-CM | POA: Diagnosis not present

## 2016-08-30 DIAGNOSIS — I10 Essential (primary) hypertension: Secondary | ICD-10-CM | POA: Diagnosis not present

## 2016-08-30 DIAGNOSIS — J479 Bronchiectasis, uncomplicated: Secondary | ICD-10-CM | POA: Diagnosis not present

## 2016-08-30 DIAGNOSIS — R2681 Unsteadiness on feet: Secondary | ICD-10-CM | POA: Diagnosis not present

## 2016-08-30 DIAGNOSIS — Z9181 History of falling: Secondary | ICD-10-CM | POA: Diagnosis not present

## 2016-08-30 DIAGNOSIS — M810 Age-related osteoporosis without current pathological fracture: Secondary | ICD-10-CM | POA: Diagnosis not present

## 2016-08-30 DIAGNOSIS — B029 Zoster without complications: Secondary | ICD-10-CM | POA: Diagnosis not present

## 2016-08-30 DIAGNOSIS — E559 Vitamin D deficiency, unspecified: Secondary | ICD-10-CM | POA: Diagnosis not present

## 2016-08-30 DIAGNOSIS — M150 Primary generalized (osteo)arthritis: Secondary | ICD-10-CM | POA: Diagnosis not present

## 2016-08-30 DIAGNOSIS — D649 Anemia, unspecified: Secondary | ICD-10-CM | POA: Diagnosis not present

## 2016-08-30 DIAGNOSIS — Z87891 Personal history of nicotine dependence: Secondary | ICD-10-CM | POA: Diagnosis not present

## 2016-08-31 DIAGNOSIS — N183 Chronic kidney disease, stage 3 unspecified: Secondary | ICD-10-CM | POA: Insufficient documentation

## 2016-08-31 DIAGNOSIS — G894 Chronic pain syndrome: Secondary | ICD-10-CM | POA: Insufficient documentation

## 2016-08-31 DIAGNOSIS — G629 Polyneuropathy, unspecified: Secondary | ICD-10-CM | POA: Insufficient documentation

## 2016-09-02 DIAGNOSIS — I10 Essential (primary) hypertension: Secondary | ICD-10-CM | POA: Diagnosis not present

## 2016-09-02 DIAGNOSIS — D649 Anemia, unspecified: Secondary | ICD-10-CM | POA: Diagnosis not present

## 2016-09-02 DIAGNOSIS — B029 Zoster without complications: Secondary | ICD-10-CM | POA: Diagnosis not present

## 2016-09-02 DIAGNOSIS — R2681 Unsteadiness on feet: Secondary | ICD-10-CM | POA: Diagnosis not present

## 2016-09-02 DIAGNOSIS — M150 Primary generalized (osteo)arthritis: Secondary | ICD-10-CM | POA: Diagnosis not present

## 2016-09-02 DIAGNOSIS — Z9181 History of falling: Secondary | ICD-10-CM | POA: Diagnosis not present

## 2016-09-02 DIAGNOSIS — J479 Bronchiectasis, uncomplicated: Secondary | ICD-10-CM | POA: Diagnosis not present

## 2016-09-02 DIAGNOSIS — E559 Vitamin D deficiency, unspecified: Secondary | ICD-10-CM | POA: Diagnosis not present

## 2016-09-02 DIAGNOSIS — Z87891 Personal history of nicotine dependence: Secondary | ICD-10-CM | POA: Diagnosis not present

## 2016-09-02 DIAGNOSIS — Z96653 Presence of artificial knee joint, bilateral: Secondary | ICD-10-CM | POA: Diagnosis not present

## 2016-09-02 DIAGNOSIS — M810 Age-related osteoporosis without current pathological fracture: Secondary | ICD-10-CM | POA: Diagnosis not present

## 2016-09-03 DIAGNOSIS — Z9181 History of falling: Secondary | ICD-10-CM | POA: Diagnosis not present

## 2016-09-03 DIAGNOSIS — B029 Zoster without complications: Secondary | ICD-10-CM | POA: Diagnosis not present

## 2016-09-03 DIAGNOSIS — E559 Vitamin D deficiency, unspecified: Secondary | ICD-10-CM | POA: Diagnosis not present

## 2016-09-03 DIAGNOSIS — Z87891 Personal history of nicotine dependence: Secondary | ICD-10-CM | POA: Diagnosis not present

## 2016-09-03 DIAGNOSIS — M150 Primary generalized (osteo)arthritis: Secondary | ICD-10-CM | POA: Diagnosis not present

## 2016-09-03 DIAGNOSIS — I10 Essential (primary) hypertension: Secondary | ICD-10-CM | POA: Diagnosis not present

## 2016-09-03 DIAGNOSIS — J479 Bronchiectasis, uncomplicated: Secondary | ICD-10-CM | POA: Diagnosis not present

## 2016-09-03 DIAGNOSIS — Z96653 Presence of artificial knee joint, bilateral: Secondary | ICD-10-CM | POA: Diagnosis not present

## 2016-09-03 DIAGNOSIS — M810 Age-related osteoporosis without current pathological fracture: Secondary | ICD-10-CM | POA: Diagnosis not present

## 2016-09-03 DIAGNOSIS — R2681 Unsteadiness on feet: Secondary | ICD-10-CM | POA: Diagnosis not present

## 2016-09-03 DIAGNOSIS — D649 Anemia, unspecified: Secondary | ICD-10-CM | POA: Diagnosis not present

## 2016-09-05 DIAGNOSIS — Z87891 Personal history of nicotine dependence: Secondary | ICD-10-CM | POA: Diagnosis not present

## 2016-09-05 DIAGNOSIS — B029 Zoster without complications: Secondary | ICD-10-CM | POA: Diagnosis not present

## 2016-09-05 DIAGNOSIS — D649 Anemia, unspecified: Secondary | ICD-10-CM | POA: Diagnosis not present

## 2016-09-05 DIAGNOSIS — M150 Primary generalized (osteo)arthritis: Secondary | ICD-10-CM | POA: Diagnosis not present

## 2016-09-05 DIAGNOSIS — E559 Vitamin D deficiency, unspecified: Secondary | ICD-10-CM | POA: Diagnosis not present

## 2016-09-05 DIAGNOSIS — I10 Essential (primary) hypertension: Secondary | ICD-10-CM | POA: Diagnosis not present

## 2016-09-05 DIAGNOSIS — Z96653 Presence of artificial knee joint, bilateral: Secondary | ICD-10-CM | POA: Diagnosis not present

## 2016-09-05 DIAGNOSIS — M810 Age-related osteoporosis without current pathological fracture: Secondary | ICD-10-CM | POA: Diagnosis not present

## 2016-09-05 DIAGNOSIS — R2681 Unsteadiness on feet: Secondary | ICD-10-CM | POA: Diagnosis not present

## 2016-09-05 DIAGNOSIS — Z9181 History of falling: Secondary | ICD-10-CM | POA: Diagnosis not present

## 2016-09-05 DIAGNOSIS — J479 Bronchiectasis, uncomplicated: Secondary | ICD-10-CM | POA: Diagnosis not present

## 2016-09-10 DIAGNOSIS — M810 Age-related osteoporosis without current pathological fracture: Secondary | ICD-10-CM | POA: Diagnosis not present

## 2016-09-10 DIAGNOSIS — Z96653 Presence of artificial knee joint, bilateral: Secondary | ICD-10-CM | POA: Diagnosis not present

## 2016-09-10 DIAGNOSIS — B029 Zoster without complications: Secondary | ICD-10-CM | POA: Diagnosis not present

## 2016-09-10 DIAGNOSIS — J479 Bronchiectasis, uncomplicated: Secondary | ICD-10-CM | POA: Diagnosis not present

## 2016-09-10 DIAGNOSIS — M150 Primary generalized (osteo)arthritis: Secondary | ICD-10-CM | POA: Diagnosis not present

## 2016-09-10 DIAGNOSIS — I10 Essential (primary) hypertension: Secondary | ICD-10-CM | POA: Diagnosis not present

## 2016-09-10 DIAGNOSIS — Z9181 History of falling: Secondary | ICD-10-CM | POA: Diagnosis not present

## 2016-09-10 DIAGNOSIS — R2681 Unsteadiness on feet: Secondary | ICD-10-CM | POA: Diagnosis not present

## 2016-09-10 DIAGNOSIS — D649 Anemia, unspecified: Secondary | ICD-10-CM | POA: Diagnosis not present

## 2016-09-10 DIAGNOSIS — Z87891 Personal history of nicotine dependence: Secondary | ICD-10-CM | POA: Diagnosis not present

## 2016-09-10 DIAGNOSIS — E559 Vitamin D deficiency, unspecified: Secondary | ICD-10-CM | POA: Diagnosis not present

## 2016-09-11 DIAGNOSIS — E039 Hypothyroidism, unspecified: Secondary | ICD-10-CM | POA: Diagnosis not present

## 2016-09-11 DIAGNOSIS — E785 Hyperlipidemia, unspecified: Secondary | ICD-10-CM | POA: Diagnosis not present

## 2016-09-11 DIAGNOSIS — N183 Chronic kidney disease, stage 3 (moderate): Secondary | ICD-10-CM | POA: Diagnosis not present

## 2016-09-11 DIAGNOSIS — D631 Anemia in chronic kidney disease: Secondary | ICD-10-CM | POA: Diagnosis not present

## 2016-09-11 DIAGNOSIS — N2581 Secondary hyperparathyroidism of renal origin: Secondary | ICD-10-CM | POA: Diagnosis not present

## 2016-09-12 DIAGNOSIS — Z9181 History of falling: Secondary | ICD-10-CM | POA: Diagnosis not present

## 2016-09-12 DIAGNOSIS — M150 Primary generalized (osteo)arthritis: Secondary | ICD-10-CM | POA: Diagnosis not present

## 2016-09-12 DIAGNOSIS — Z96653 Presence of artificial knee joint, bilateral: Secondary | ICD-10-CM | POA: Diagnosis not present

## 2016-09-12 DIAGNOSIS — J479 Bronchiectasis, uncomplicated: Secondary | ICD-10-CM | POA: Diagnosis not present

## 2016-09-12 DIAGNOSIS — Z87891 Personal history of nicotine dependence: Secondary | ICD-10-CM | POA: Diagnosis not present

## 2016-09-12 DIAGNOSIS — M810 Age-related osteoporosis without current pathological fracture: Secondary | ICD-10-CM | POA: Diagnosis not present

## 2016-09-12 DIAGNOSIS — I10 Essential (primary) hypertension: Secondary | ICD-10-CM | POA: Diagnosis not present

## 2016-09-12 DIAGNOSIS — D649 Anemia, unspecified: Secondary | ICD-10-CM | POA: Diagnosis not present

## 2016-09-12 DIAGNOSIS — E559 Vitamin D deficiency, unspecified: Secondary | ICD-10-CM | POA: Diagnosis not present

## 2016-09-12 DIAGNOSIS — R2681 Unsteadiness on feet: Secondary | ICD-10-CM | POA: Diagnosis not present

## 2016-09-12 DIAGNOSIS — B029 Zoster without complications: Secondary | ICD-10-CM | POA: Diagnosis not present

## 2016-09-16 DIAGNOSIS — D649 Anemia, unspecified: Secondary | ICD-10-CM | POA: Diagnosis not present

## 2016-09-16 DIAGNOSIS — M810 Age-related osteoporosis without current pathological fracture: Secondary | ICD-10-CM | POA: Diagnosis not present

## 2016-09-16 DIAGNOSIS — I10 Essential (primary) hypertension: Secondary | ICD-10-CM | POA: Diagnosis not present

## 2016-09-16 DIAGNOSIS — B029 Zoster without complications: Secondary | ICD-10-CM | POA: Diagnosis not present

## 2016-09-16 DIAGNOSIS — Z9181 History of falling: Secondary | ICD-10-CM | POA: Diagnosis not present

## 2016-09-16 DIAGNOSIS — M150 Primary generalized (osteo)arthritis: Secondary | ICD-10-CM | POA: Diagnosis not present

## 2016-09-16 DIAGNOSIS — J479 Bronchiectasis, uncomplicated: Secondary | ICD-10-CM | POA: Diagnosis not present

## 2016-09-16 DIAGNOSIS — R2681 Unsteadiness on feet: Secondary | ICD-10-CM | POA: Diagnosis not present

## 2016-09-16 DIAGNOSIS — E559 Vitamin D deficiency, unspecified: Secondary | ICD-10-CM | POA: Diagnosis not present

## 2016-09-16 DIAGNOSIS — Z87891 Personal history of nicotine dependence: Secondary | ICD-10-CM | POA: Diagnosis not present

## 2016-09-16 DIAGNOSIS — Z96653 Presence of artificial knee joint, bilateral: Secondary | ICD-10-CM | POA: Diagnosis not present

## 2016-09-18 DIAGNOSIS — B029 Zoster without complications: Secondary | ICD-10-CM | POA: Diagnosis not present

## 2016-09-18 DIAGNOSIS — Z87891 Personal history of nicotine dependence: Secondary | ICD-10-CM | POA: Diagnosis not present

## 2016-09-18 DIAGNOSIS — Z9181 History of falling: Secondary | ICD-10-CM | POA: Diagnosis not present

## 2016-09-18 DIAGNOSIS — D649 Anemia, unspecified: Secondary | ICD-10-CM | POA: Diagnosis not present

## 2016-09-18 DIAGNOSIS — E559 Vitamin D deficiency, unspecified: Secondary | ICD-10-CM | POA: Diagnosis not present

## 2016-09-18 DIAGNOSIS — R2681 Unsteadiness on feet: Secondary | ICD-10-CM | POA: Diagnosis not present

## 2016-09-18 DIAGNOSIS — Z96653 Presence of artificial knee joint, bilateral: Secondary | ICD-10-CM | POA: Diagnosis not present

## 2016-09-18 DIAGNOSIS — J479 Bronchiectasis, uncomplicated: Secondary | ICD-10-CM | POA: Diagnosis not present

## 2016-09-18 DIAGNOSIS — M810 Age-related osteoporosis without current pathological fracture: Secondary | ICD-10-CM | POA: Diagnosis not present

## 2016-09-18 DIAGNOSIS — M150 Primary generalized (osteo)arthritis: Secondary | ICD-10-CM | POA: Diagnosis not present

## 2016-09-18 DIAGNOSIS — I10 Essential (primary) hypertension: Secondary | ICD-10-CM | POA: Diagnosis not present

## 2016-09-19 DIAGNOSIS — Z87891 Personal history of nicotine dependence: Secondary | ICD-10-CM | POA: Diagnosis not present

## 2016-09-19 DIAGNOSIS — R2681 Unsteadiness on feet: Secondary | ICD-10-CM | POA: Diagnosis not present

## 2016-09-19 DIAGNOSIS — J479 Bronchiectasis, uncomplicated: Secondary | ICD-10-CM | POA: Diagnosis not present

## 2016-09-19 DIAGNOSIS — Z9181 History of falling: Secondary | ICD-10-CM | POA: Diagnosis not present

## 2016-09-19 DIAGNOSIS — M810 Age-related osteoporosis without current pathological fracture: Secondary | ICD-10-CM | POA: Diagnosis not present

## 2016-09-19 DIAGNOSIS — M150 Primary generalized (osteo)arthritis: Secondary | ICD-10-CM | POA: Diagnosis not present

## 2016-09-19 DIAGNOSIS — Z96653 Presence of artificial knee joint, bilateral: Secondary | ICD-10-CM | POA: Diagnosis not present

## 2016-09-19 DIAGNOSIS — E559 Vitamin D deficiency, unspecified: Secondary | ICD-10-CM | POA: Diagnosis not present

## 2016-09-19 DIAGNOSIS — I10 Essential (primary) hypertension: Secondary | ICD-10-CM | POA: Diagnosis not present

## 2016-09-19 DIAGNOSIS — B029 Zoster without complications: Secondary | ICD-10-CM | POA: Diagnosis not present

## 2016-09-19 DIAGNOSIS — D649 Anemia, unspecified: Secondary | ICD-10-CM | POA: Diagnosis not present

## 2016-09-23 DIAGNOSIS — M810 Age-related osteoporosis without current pathological fracture: Secondary | ICD-10-CM | POA: Diagnosis not present

## 2016-09-23 DIAGNOSIS — M150 Primary generalized (osteo)arthritis: Secondary | ICD-10-CM | POA: Diagnosis not present

## 2016-09-23 DIAGNOSIS — Z96653 Presence of artificial knee joint, bilateral: Secondary | ICD-10-CM | POA: Diagnosis not present

## 2016-09-23 DIAGNOSIS — E559 Vitamin D deficiency, unspecified: Secondary | ICD-10-CM | POA: Diagnosis not present

## 2016-09-23 DIAGNOSIS — Z9181 History of falling: Secondary | ICD-10-CM | POA: Diagnosis not present

## 2016-09-23 DIAGNOSIS — D649 Anemia, unspecified: Secondary | ICD-10-CM | POA: Diagnosis not present

## 2016-09-23 DIAGNOSIS — R2681 Unsteadiness on feet: Secondary | ICD-10-CM | POA: Diagnosis not present

## 2016-09-23 DIAGNOSIS — Z87891 Personal history of nicotine dependence: Secondary | ICD-10-CM | POA: Diagnosis not present

## 2016-09-23 DIAGNOSIS — I10 Essential (primary) hypertension: Secondary | ICD-10-CM | POA: Diagnosis not present

## 2016-09-23 DIAGNOSIS — J479 Bronchiectasis, uncomplicated: Secondary | ICD-10-CM | POA: Diagnosis not present

## 2016-09-23 DIAGNOSIS — B029 Zoster without complications: Secondary | ICD-10-CM | POA: Diagnosis not present

## 2016-09-27 DIAGNOSIS — Z96653 Presence of artificial knee joint, bilateral: Secondary | ICD-10-CM | POA: Diagnosis not present

## 2016-09-27 DIAGNOSIS — Z87891 Personal history of nicotine dependence: Secondary | ICD-10-CM | POA: Diagnosis not present

## 2016-09-27 DIAGNOSIS — D649 Anemia, unspecified: Secondary | ICD-10-CM | POA: Diagnosis not present

## 2016-09-27 DIAGNOSIS — J479 Bronchiectasis, uncomplicated: Secondary | ICD-10-CM | POA: Diagnosis not present

## 2016-09-27 DIAGNOSIS — I10 Essential (primary) hypertension: Secondary | ICD-10-CM | POA: Diagnosis not present

## 2016-09-27 DIAGNOSIS — M150 Primary generalized (osteo)arthritis: Secondary | ICD-10-CM | POA: Diagnosis not present

## 2016-09-27 DIAGNOSIS — E559 Vitamin D deficiency, unspecified: Secondary | ICD-10-CM | POA: Diagnosis not present

## 2016-09-27 DIAGNOSIS — B029 Zoster without complications: Secondary | ICD-10-CM | POA: Diagnosis not present

## 2016-09-27 DIAGNOSIS — Z9181 History of falling: Secondary | ICD-10-CM | POA: Diagnosis not present

## 2016-09-27 DIAGNOSIS — M810 Age-related osteoporosis without current pathological fracture: Secondary | ICD-10-CM | POA: Diagnosis not present

## 2016-09-27 DIAGNOSIS — R2681 Unsteadiness on feet: Secondary | ICD-10-CM | POA: Diagnosis not present

## 2016-09-28 DIAGNOSIS — Z9181 History of falling: Secondary | ICD-10-CM | POA: Diagnosis not present

## 2016-09-28 DIAGNOSIS — J479 Bronchiectasis, uncomplicated: Secondary | ICD-10-CM | POA: Diagnosis not present

## 2016-09-28 DIAGNOSIS — R2681 Unsteadiness on feet: Secondary | ICD-10-CM | POA: Diagnosis not present

## 2016-09-28 DIAGNOSIS — Z96653 Presence of artificial knee joint, bilateral: Secondary | ICD-10-CM | POA: Diagnosis not present

## 2016-09-28 DIAGNOSIS — M810 Age-related osteoporosis without current pathological fracture: Secondary | ICD-10-CM | POA: Diagnosis not present

## 2016-09-28 DIAGNOSIS — D649 Anemia, unspecified: Secondary | ICD-10-CM | POA: Diagnosis not present

## 2016-09-28 DIAGNOSIS — I10 Essential (primary) hypertension: Secondary | ICD-10-CM | POA: Diagnosis not present

## 2016-09-28 DIAGNOSIS — M150 Primary generalized (osteo)arthritis: Secondary | ICD-10-CM | POA: Diagnosis not present

## 2016-09-28 DIAGNOSIS — E559 Vitamin D deficiency, unspecified: Secondary | ICD-10-CM | POA: Diagnosis not present

## 2016-09-28 DIAGNOSIS — B029 Zoster without complications: Secondary | ICD-10-CM | POA: Diagnosis not present

## 2016-09-28 DIAGNOSIS — Z87891 Personal history of nicotine dependence: Secondary | ICD-10-CM | POA: Diagnosis not present

## 2016-10-01 DIAGNOSIS — N183 Chronic kidney disease, stage 3 (moderate): Secondary | ICD-10-CM | POA: Diagnosis not present

## 2016-10-01 DIAGNOSIS — N2581 Secondary hyperparathyroidism of renal origin: Secondary | ICD-10-CM | POA: Diagnosis not present

## 2016-10-01 DIAGNOSIS — E785 Hyperlipidemia, unspecified: Secondary | ICD-10-CM | POA: Diagnosis not present

## 2016-10-01 DIAGNOSIS — D631 Anemia in chronic kidney disease: Secondary | ICD-10-CM | POA: Diagnosis not present

## 2016-10-01 DIAGNOSIS — E039 Hypothyroidism, unspecified: Secondary | ICD-10-CM | POA: Diagnosis not present

## 2016-10-31 ENCOUNTER — Ambulatory Visit (HOSPITAL_BASED_OUTPATIENT_CLINIC_OR_DEPARTMENT_OTHER): Payer: Medicare Other | Admitting: Hematology and Oncology

## 2016-10-31 ENCOUNTER — Other Ambulatory Visit (HOSPITAL_BASED_OUTPATIENT_CLINIC_OR_DEPARTMENT_OTHER): Payer: Medicare Other

## 2016-10-31 DIAGNOSIS — D693 Immune thrombocytopenic purpura: Secondary | ICD-10-CM

## 2016-10-31 DIAGNOSIS — K5909 Other constipation: Secondary | ICD-10-CM | POA: Diagnosis not present

## 2016-10-31 DIAGNOSIS — D638 Anemia in other chronic diseases classified elsewhere: Secondary | ICD-10-CM

## 2016-10-31 DIAGNOSIS — D649 Anemia, unspecified: Secondary | ICD-10-CM

## 2016-10-31 LAB — CBC WITH DIFFERENTIAL/PLATELET
BASO%: 1.1 % (ref 0.0–2.0)
BASOS ABS: 0.1 10*3/uL (ref 0.0–0.1)
EOS%: 4.2 % (ref 0.0–7.0)
Eosinophils Absolute: 0.3 10*3/uL (ref 0.0–0.5)
HEMATOCRIT: 33.8 % — AB (ref 34.8–46.6)
HEMOGLOBIN: 10.6 g/dL — AB (ref 11.6–15.9)
LYMPH#: 2.1 10*3/uL (ref 0.9–3.3)
LYMPH%: 31.4 % (ref 14.0–49.7)
MCH: 25.2 pg (ref 25.1–34.0)
MCHC: 31.4 g/dL — ABNORMAL LOW (ref 31.5–36.0)
MCV: 80.2 fL (ref 79.5–101.0)
MONO#: 0.6 10*3/uL (ref 0.1–0.9)
MONO%: 8.7 % (ref 0.0–14.0)
NEUT%: 54.6 % (ref 38.4–76.8)
NEUTROS ABS: 3.6 10*3/uL (ref 1.5–6.5)
Platelets: 224 10*3/uL (ref 145–400)
RBC: 4.22 10*6/uL (ref 3.70–5.45)
RDW: 17 % — AB (ref 11.2–14.5)
WBC: 6.6 10*3/uL (ref 3.9–10.3)

## 2016-11-01 ENCOUNTER — Encounter: Payer: Self-pay | Admitting: Hematology and Oncology

## 2016-11-01 DIAGNOSIS — K5909 Other constipation: Secondary | ICD-10-CM | POA: Insufficient documentation

## 2016-11-01 NOTE — Assessment & Plan Note (Signed)
She has chronic constipation. We discussed chronic constipation management including using laxatives as needed and increase fruit and fiber intake

## 2016-11-01 NOTE — Assessment & Plan Note (Signed)
She has no further recurrence of ITP. I recommend close follow-up with primary care doctor only.

## 2016-11-01 NOTE — Progress Notes (Signed)
[facility] OFFICE PROGRESS NOTE  [name] GrippeJames Kim, Gould SUMMARY OF HEMATOLOGIC HISTORY:  #1 2012, she has thrombocytopenia that resolved spontaneously. The patient also recalled taking prednisone in the past for possible diagnosis of lupus #2 04/12/2013 she had mild microcytic anemia #3 07/09/2013 she has persistent mild microcytic anemia and thrombocytopenia with a platelet count of 51,000 #4 08/02/2013 I saw the patient and recommended a bone marrow aspirate and biopsy #5 08/06/2013 I perform bone marrow aspirate and biopsy. The patient received 1 unit of platelets for severe thrombocytopenia with a platelet count of 5000 #6 08/11/13: she is started on prednisone 80 mg daily #7 08/19/13: platelet count improves to 222. We appreciated steroid taper #8 09/02/2013 her platelet count dropped to 90,000. We increased prednisone back to 60 mg a day #9 09/16/13: dose of prednisone was reduced back to 40 mg #10 09/30/13: prednisone dose was reduced to 20 mg #11 10/14/13: prednisone dose was reduced to 10 mg #12 10/21/13: Prednisone dose was reduced to 5 mg #13 11/01/13: Prednisone dose was reduced to 2.5 mg #14 11/15/2013: Prednisone dose was reduced to 2.5 mg every other day #15 12/27/2013, prednisone is discontinued The patient was readmitted to the hospital from 01/26/2016 to 01/28/2016 for relapsed ITP. She received 2 doses of IVIG and high-dose dexamethasone 3 days INTERVAL HISTORY: [name] 75 y.o. female returns for further follow-up. She complained of fatigue. She has chronic constipation. Denies recent infection. The patient denies any recent signs or symptoms of bleeding such as spontaneous epistaxis, hematuria or hematochezia.   I have reviewed the past medical history, past surgical history, social history and family history with the patient and they are unchanged from previous note.  ALLERGIES:  is allergic to cauliflower [brassica oleracea italica]; codeine; fish  allergy; lactose intolerance (gi); morphine and related; oxycodone; penicillins; sulfonamide derivatives; tramadol hcl; and triamcinolone.  MEDICATIONS:  Current Outpatient Prescriptions  Medication Sig Dispense Refill  . acetaminophen (TYLENOL) 500 MG tablet Take 500 mg by mouth every 6 (six) hours as needed.    Marland Kitchen. amLODipine (NORVASC) 5 MG tablet Take 5 mg by mouth 2 (two) times daily. Reported on 05/14/2016  4  . BUTRANS 5 MCG/HR PTWK patch Place 1 patch (5 mcg total) onto the skin once a week. Thursday 4 patch 0  . citalopram (CELEXA) 20 MG tablet Take 20 mg by mouth daily.    . furosemide (LASIX) 20 MG tablet     . gabapentin (NEURONTIN) 100 MG capsule Take 100 mg by mouth 3 (three) times daily.    Marland Kitchen. levothyroxine (SYNTHROID, LEVOTHROID) 100 MCG tablet Take 100 mcg by mouth daily before breakfast.     . loratadine (CLARITIN) 10 MG tablet TAKE 1 TABLET (10 MG TOTAL) BY MOUTH DAILY. 30 tablet 5  . losartan (COZAAR) 100 MG tablet Take 100 mg by mouth daily.    . ondansetron (ZOFRAN) 8 MG tablet Take 1 tablet (8 mg total) by mouth every 8 (eight) hours as needed for nausea or vomiting. 20 tablet 0  . pantoprazole (PROTONIX) 40 MG tablet TAKE 1 TABLET BY MOUTH DAILY 90 tablet 1  . potassium chloride SA (K-DUR,KLOR-CON) 20 MEQ tablet Take 1 tablet (20 mEq total) by mouth 2 (two) times daily. 20 tablet 0   No current facility-administered medications for this visit.      REVIEW OF SYSTEMS:   Constitutional: Denies fevers, chills or night sweats Eyes: Denies blurriness of vision Ears, nose, mouth, throat, and face: Denies mucositis or  sore throat Respiratory: Denies cough, dyspnea or wheezes Cardiovascular: Denies palpitation, chest discomfort or lower extremity swelling Skin: Denies abnormal skin rashes Lymphatics: Denies new lymphadenopathy or easy bruising Neurological:Denies numbness, tingling or new weaknesses Behavioral/Psych: Mood is stable, no new changes  All other systems were  reviewed with the patient and are negative.  PHYSICAL EXAMINATION: ECOG PERFORMANCE STATUS: 1 - Symptomatic but completely ambulatory  Vitals:   10/31/16 1238  BP: 135/60  Pulse: 75  Resp: 18  Temp: 97.5 F (36.4 C)   Filed Weights   10/31/16 1238  Weight: 206 lb (93.4 kg)    GENERAL:alert, no distress and comfortable SKIN: skin color, texture, turgor are normal, no rashes or significant lesions EYES: normal, Conjunctiva are pink and non-injected, sclera clear OROPHARYNX:no exudate, no erythema and lips, buccal mucosa, and tongue normal  NECK: supple, thyroid normal size, non-tender, without nodularity LYMPH:  no palpable lymphadenopathy in the cervical, axillary or inguinal LUNGS: clear to auscultation and percussion with normal breathing effort HEART: regular rate & rhythm and no murmurs and no lower extremity edema ABDOMEN:abdomen soft, non-tender and normal bowel sounds Musculoskeletal:no cyanosis of digits and no clubbing  NEURO: alert & oriented x 3 with fluent speech, no focal motor/sensory deficits  LABORATORY DATA:  I have reviewed the data as listed     Component Value Date/Time   NA 138 06/20/2016 1936   NA 141 02/05/2016 1124   K 2.6 (LL) 06/20/2016 1936   K 3.7 02/05/2016 1124   CL 103 06/20/2016 1936   CO2 28 06/20/2016 1936   CO2 28 02/05/2016 1124   GLUCOSE 85 06/20/2016 1936   GLUCOSE 79 02/05/2016 1124   BUN 12 06/20/2016 1936   BUN 18.3 02/05/2016 1124   CREATININE 1.28 (H) 06/20/2016 1936   CREATININE 1.2 (H) 02/05/2016 1124   CALCIUM 10.8 (H) 06/20/2016 1936   CALCIUM 9.7 02/05/2016 1124   PROT 6.7 06/20/2016 1936   PROT 8.1 02/05/2016 1124   ALBUMIN 3.5 06/20/2016 1936   ALBUMIN 3.0 (L) 02/05/2016 1124   AST 14 (L) 06/20/2016 1936   AST 32 02/05/2016 1124   ALT 9 (L) 06/20/2016 1936   ALT 25 02/05/2016 1124   ALKPHOS 76 06/20/2016 1936   ALKPHOS 62 02/05/2016 1124   BILITOT 0.9 06/20/2016 1936   BILITOT 0.42 02/05/2016 1124    GFRNONAA 40 (L) 06/20/2016 1936   GFRAA 46 (L) 06/20/2016 1936    No results found for: SPEP, UPEP  Lab Results  Component Value Date   WBC 6.6 10/31/2016   NEUTROABS 3.6 10/31/2016   HGB 10.6 (L) 10/31/2016   HCT 33.8 (L) 10/31/2016   MCV 80.2 10/31/2016   PLT 224 10/31/2016      Chemistry      Component Value Date/Time   NA 138 06/20/2016 1936   NA 141 02/05/2016 1124   K 2.6 (LL) 06/20/2016 1936   K 3.7 02/05/2016 1124   CL 103 06/20/2016 1936   CO2 28 06/20/2016 1936   CO2 28 02/05/2016 1124   BUN 12 06/20/2016 1936   BUN 18.3 02/05/2016 1124   CREATININE 1.28 (H) 06/20/2016 1936   CREATININE 1.2 (H) 02/05/2016 1124      Component Value Date/Time   CALCIUM 10.8 (H) 06/20/2016 1936   CALCIUM 9.7 02/05/2016 1124   ALKPHOS 76 06/20/2016 1936   ALKPHOS 62 02/05/2016 1124   AST 14 (L) 06/20/2016 1936   AST 32 02/05/2016 1124   ALT 9 (L) 06/20/2016 1936  ALT 25 02/05/2016 1124   BILITOT 0.9 06/20/2016 1936   BILITOT 0.42 02/05/2016 1124       ASSESSMENT & PLAN:  Idiopathic thrombocytopenic purpura (HCC) She has no further recurrence of ITP. I recommend close follow-up with primary care doctor only.  Anemia of chronic disease This could be due to anemia chronic disease. Clinically, she is not symptomatic.    Chronic constipation She has chronic constipation. We discussed chronic constipation management including using laxatives as needed and increase fruit and fiber intake   No orders of the defined types were placed in this encounter.   All questions were answered. The patient knows to call the clinic with any problems, questions or concerns. No barriers to learning was detected.  I spent 15 minutes counseling the patient face to face. The total time spent in the appointment was 20 minutes and more than 50% was on counseling.     Artis Delay, Gould 12/22/201710:06 AM

## 2016-11-01 NOTE — Assessment & Plan Note (Signed)
This could be due to anemia chronic disease. Clinically, she is not symptomatic.

## 2016-11-11 DIAGNOSIS — B029 Zoster without complications: Secondary | ICD-10-CM

## 2016-11-11 HISTORY — DX: Zoster without complications: B02.9

## 2016-11-14 DIAGNOSIS — Z79891 Long term (current) use of opiate analgesic: Secondary | ICD-10-CM | POA: Diagnosis not present

## 2016-11-14 DIAGNOSIS — M545 Low back pain: Secondary | ICD-10-CM | POA: Diagnosis not present

## 2016-11-14 DIAGNOSIS — Z79899 Other long term (current) drug therapy: Secondary | ICD-10-CM | POA: Diagnosis not present

## 2016-11-14 DIAGNOSIS — M25519 Pain in unspecified shoulder: Secondary | ICD-10-CM | POA: Diagnosis not present

## 2016-11-14 DIAGNOSIS — G894 Chronic pain syndrome: Secondary | ICD-10-CM | POA: Diagnosis not present

## 2016-12-02 DIAGNOSIS — M47817 Spondylosis without myelopathy or radiculopathy, lumbosacral region: Secondary | ICD-10-CM | POA: Diagnosis not present

## 2016-12-05 ENCOUNTER — Other Ambulatory Visit: Payer: Self-pay | Admitting: Internal Medicine

## 2016-12-05 DIAGNOSIS — M810 Age-related osteoporosis without current pathological fracture: Secondary | ICD-10-CM | POA: Diagnosis not present

## 2016-12-05 DIAGNOSIS — R609 Edema, unspecified: Secondary | ICD-10-CM

## 2016-12-05 DIAGNOSIS — Z0000 Encounter for general adult medical examination without abnormal findings: Secondary | ICD-10-CM | POA: Diagnosis not present

## 2016-12-05 DIAGNOSIS — E039 Hypothyroidism, unspecified: Secondary | ICD-10-CM | POA: Diagnosis not present

## 2016-12-06 ENCOUNTER — Ambulatory Visit
Admission: RE | Admit: 2016-12-06 | Discharge: 2016-12-06 | Disposition: A | Discharge status: Home / Self Care | Payer: Medicare Other | Source: Ambulatory Visit | Attending: Internal Medicine | Admitting: Internal Medicine

## 2016-12-06 DIAGNOSIS — R609 Edema, unspecified: Secondary | ICD-10-CM

## 2016-12-06 DIAGNOSIS — R600 Localized edema: Secondary | ICD-10-CM | POA: Diagnosis not present

## 2016-12-11 DIAGNOSIS — M75100 Unspecified rotator cuff tear or rupture of unspecified shoulder, not specified as traumatic: Secondary | ICD-10-CM | POA: Diagnosis not present

## 2016-12-11 DIAGNOSIS — M19019 Primary osteoarthritis, unspecified shoulder: Secondary | ICD-10-CM | POA: Diagnosis not present

## 2016-12-19 DIAGNOSIS — M810 Age-related osteoporosis without current pathological fracture: Secondary | ICD-10-CM | POA: Diagnosis not present

## 2016-12-19 DIAGNOSIS — Z0000 Encounter for general adult medical examination without abnormal findings: Secondary | ICD-10-CM | POA: Diagnosis not present

## 2016-12-19 DIAGNOSIS — E559 Vitamin D deficiency, unspecified: Secondary | ICD-10-CM | POA: Diagnosis not present

## 2016-12-19 DIAGNOSIS — R609 Edema, unspecified: Secondary | ICD-10-CM | POA: Diagnosis not present

## 2016-12-19 DIAGNOSIS — E039 Hypothyroidism, unspecified: Secondary | ICD-10-CM | POA: Diagnosis not present

## 2017-01-03 DIAGNOSIS — I10 Essential (primary) hypertension: Secondary | ICD-10-CM | POA: Diagnosis not present

## 2017-01-03 DIAGNOSIS — L918 Other hypertrophic disorders of the skin: Secondary | ICD-10-CM | POA: Diagnosis not present

## 2017-01-03 DIAGNOSIS — E559 Vitamin D deficiency, unspecified: Secondary | ICD-10-CM | POA: Diagnosis not present

## 2017-01-03 DIAGNOSIS — Z0000 Encounter for general adult medical examination without abnormal findings: Secondary | ICD-10-CM | POA: Diagnosis not present

## 2017-01-03 DIAGNOSIS — E039 Hypothyroidism, unspecified: Secondary | ICD-10-CM | POA: Diagnosis not present

## 2017-01-03 DIAGNOSIS — M810 Age-related osteoporosis without current pathological fracture: Secondary | ICD-10-CM | POA: Diagnosis not present

## 2017-01-03 DIAGNOSIS — Z23 Encounter for immunization: Secondary | ICD-10-CM | POA: Diagnosis not present

## 2017-01-14 ENCOUNTER — Other Ambulatory Visit: Payer: Self-pay | Admitting: Emergency Medicine

## 2017-02-13 ENCOUNTER — Other Ambulatory Visit: Payer: Self-pay

## 2017-02-13 MED ORDER — LORATADINE 10 MG PO TABS
ORAL_TABLET | ORAL | 1 refills | Status: AC
Start: 2017-02-13 — End: ?

## 2017-02-20 ENCOUNTER — Other Ambulatory Visit: Payer: Self-pay | Admitting: Acute Care

## 2017-03-26 DIAGNOSIS — D631 Anemia in chronic kidney disease: Secondary | ICD-10-CM | POA: Diagnosis not present

## 2017-03-26 DIAGNOSIS — E039 Hypothyroidism, unspecified: Secondary | ICD-10-CM | POA: Diagnosis not present

## 2017-03-26 DIAGNOSIS — N2581 Secondary hyperparathyroidism of renal origin: Secondary | ICD-10-CM | POA: Diagnosis not present

## 2017-03-26 DIAGNOSIS — E785 Hyperlipidemia, unspecified: Secondary | ICD-10-CM | POA: Diagnosis not present

## 2017-03-26 DIAGNOSIS — N183 Chronic kidney disease, stage 3 (moderate): Secondary | ICD-10-CM | POA: Diagnosis not present

## 2017-04-09 ENCOUNTER — Encounter (HOSPITAL_COMMUNITY): Payer: Self-pay | Admitting: Emergency Medicine

## 2017-04-09 ENCOUNTER — Emergency Department (HOSPITAL_COMMUNITY)
Admission: EM | Admit: 2017-04-09 | Discharge: 2017-04-09 | Disposition: A | Discharge status: Home / Self Care | Payer: Medicare Other | Attending: Emergency Medicine | Admitting: Emergency Medicine

## 2017-04-09 ENCOUNTER — Emergency Department (HOSPITAL_COMMUNITY): Payer: Medicare Other

## 2017-04-09 DIAGNOSIS — I129 Hypertensive chronic kidney disease with stage 1 through stage 4 chronic kidney disease, or unspecified chronic kidney disease: Secondary | ICD-10-CM | POA: Diagnosis not present

## 2017-04-09 DIAGNOSIS — N390 Urinary tract infection, site not specified: Secondary | ICD-10-CM | POA: Diagnosis not present

## 2017-04-09 DIAGNOSIS — E039 Hypothyroidism, unspecified: Secondary | ICD-10-CM | POA: Diagnosis not present

## 2017-04-09 DIAGNOSIS — Z79899 Other long term (current) drug therapy: Secondary | ICD-10-CM | POA: Insufficient documentation

## 2017-04-09 DIAGNOSIS — N183 Chronic kidney disease, stage 3 (moderate): Secondary | ICD-10-CM | POA: Diagnosis not present

## 2017-04-09 DIAGNOSIS — R1084 Generalized abdominal pain: Secondary | ICD-10-CM

## 2017-04-09 DIAGNOSIS — N2889 Other specified disorders of kidney and ureter: Secondary | ICD-10-CM | POA: Diagnosis not present

## 2017-04-09 DIAGNOSIS — K591 Functional diarrhea: Secondary | ICD-10-CM | POA: Diagnosis not present

## 2017-04-09 DIAGNOSIS — Z87891 Personal history of nicotine dependence: Secondary | ICD-10-CM | POA: Diagnosis not present

## 2017-04-09 DIAGNOSIS — R197 Diarrhea, unspecified: Secondary | ICD-10-CM | POA: Diagnosis not present

## 2017-04-09 LAB — CBC
HCT: 35 % — ABNORMAL LOW (ref 36.0–46.0)
HEMOGLOBIN: 11.4 g/dL — AB (ref 12.0–15.0)
MCH: 24.2 pg — ABNORMAL LOW (ref 26.0–34.0)
MCHC: 32.6 g/dL (ref 30.0–36.0)
MCV: 74.3 fL — ABNORMAL LOW (ref 78.0–100.0)
Platelets: 166 10*3/uL (ref 150–400)
RBC: 4.71 MIL/uL (ref 3.87–5.11)
RDW: 17.5 % — ABNORMAL HIGH (ref 11.5–15.5)
WBC: 5.9 10*3/uL (ref 4.0–10.5)

## 2017-04-09 LAB — COMPREHENSIVE METABOLIC PANEL
ALBUMIN: 3.5 g/dL (ref 3.5–5.0)
ALT: 8 U/L — ABNORMAL LOW (ref 14–54)
ANION GAP: 8 (ref 5–15)
AST: 13 U/L — ABNORMAL LOW (ref 15–41)
Alkaline Phosphatase: 86 U/L (ref 38–126)
BILIRUBIN TOTAL: 0.8 mg/dL (ref 0.3–1.2)
BUN: 11 mg/dL (ref 6–20)
CO2: 26 mmol/L (ref 22–32)
Calcium: 10.7 mg/dL — ABNORMAL HIGH (ref 8.9–10.3)
Chloride: 105 mmol/L (ref 101–111)
Creatinine, Ser: 1.39 mg/dL — ABNORMAL HIGH (ref 0.44–1.00)
GFR calc Af Amer: 42 mL/min — ABNORMAL LOW (ref >60)
GFR, EST NON AFRICAN AMERICAN: 36 mL/min — AB (ref >60)
Glucose, Bld: 98 mg/dL (ref 65–99)
POTASSIUM: 3.8 mmol/L (ref 3.5–5.1)
Sodium: 139 mmol/L (ref 135–145)
TOTAL PROTEIN: 7.5 g/dL (ref 6.5–8.1)

## 2017-04-09 LAB — URINALYSIS, ROUTINE W REFLEX MICROSCOPIC
BILIRUBIN URINE: NEGATIVE
Glucose, UA: NEGATIVE mg/dL
KETONES UR: NEGATIVE mg/dL
NITRITE: POSITIVE — AB
PROTEIN: 30 mg/dL — AB
Specific Gravity, Urine: 1.011 (ref 1.005–1.030)
pH: 6 (ref 5.0–8.0)

## 2017-04-09 LAB — LIPASE, BLOOD: LIPASE: 16 U/L (ref 11–51)

## 2017-04-09 LAB — I-STAT CG4 LACTIC ACID, ED: Lactic Acid, Venous: 0.9 mmol/L (ref 0.5–1.9)

## 2017-04-09 MED ORDER — IOPAMIDOL (ISOVUE-300) INJECTION 61%
INTRAVENOUS | Status: AC
  Administered 2017-04-09: 80 mL
  Filled 2017-04-09: qty 100

## 2017-04-09 MED ORDER — CEPHALEXIN 500 MG PO CAPS: 1000.0000 mg | ORAL_CAPSULE | Freq: Two times a day (BID) | ORAL | 0 refills | Status: DC

## 2017-04-09 MED ORDER — ONDANSETRON HCL 4 MG/2ML IJ SOLN
4.0000 mg | Freq: Once | INTRAMUSCULAR | Status: AC
  Administered 2017-04-09: 4 mg via INTRAVENOUS
  Filled 2017-04-09: qty 2

## 2017-04-09 MED ORDER — SODIUM CHLORIDE 0.9 % IV BOLUS (SEPSIS)
500.0000 mL | Freq: Once | INTRAVENOUS | Status: AC
  Administered 2017-04-09: 500 mL via INTRAVENOUS

## 2017-04-09 NOTE — ED Provider Notes (Signed)
[facility]-EMERGENCY DEPT Provider Note   CSN: [id number] Arrival date & time: 04/09/17  4098     History   Chief Complaint Chief Complaint  Patient presents with  . Emesis  . Diarrhea    HPI [name] is a 76 y.o. female.  HPI Patient presents to the emergency department with nausea and diarrhea with some frequency and urgency of urination.  The patient states that started over the past weekend.  She states that nothing seems make the condition better or worse.  Patient states she did not take any medications prior to arrival. The patient denies chest pain, shortness of breath, headache,blurred vision, neck pain, fever, cough, weakness, numbness, dizziness, anorexia, edema, vomiting,  rash, back pain, dysuria, hematemesis, bloody stool, near syncope, or syncope. Past Medical History:  Diagnosis Date  . Allergic rhinitis   . Anxiety   . Back pain   . Bronchiectasis   . Dyslipidemia   . Dysuria 10/14/2013  . Fatigue 08/02/2013  . GERD (gastroesophageal reflux disease)   . History of Graves' disease   . Hx of colonic polyp   . Hypercalcemia   . Hyperlipidemia   . Hypertension   . Hypokalemia 11/15/2013  . 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 Murmur, heart   . Osteoarthritis, knee   . Renal insufficiency   . Thoracic aortic aneurysm (HCC)   . Thrombocytopenia, unspecified (HCC) 08/02/2013  . Thrush 10/14/2013  . Thrush, oral 09/02/2013  . Thyroid disease   . UTI (lower urinary tract infection) 10/14/2013    Patient Active Problem List   Diagnosis Date Noted  . Chronic constipation 11/01/2016  . CKD (chronic kidney disease) stage 3, GFR 30-59 ml/min 08/31/2016  . Chronic pain syndrome 08/31/2016  . Neuropathy 08/31/2016  . Arthritis 05/15/2016  . Chest pain 05/14/2016  . Hypokalemia 11/15/2013  . Dysuria 10/14/2013  . Anemia of chronic disease 09/13/2013  . Family history of malignant neoplasm of gastrointestinal tract 09/13/2013  . Idiopathic  thrombocytopenic purpura (HCC) 08/11/2013  . Fatigue 08/02/2013  . COLONIC POLYPS, HX OF 06/19/2010  . COUGH 04/06/2010  . ANEMIA 04/02/2010  . GASTROENTERITIS, HX OF 12/18/2008  . PULMONARY NODULE, LEFT LOWER LOBE 09/02/2007  . GASTROESOPHAGEAL REFLUX DISEASE 09/02/2007  . IRRITABLE BOWEL SYNDROME 09/02/2007  . ANXIETY DISORDER, GENERALIZED 07/14/2007  . Hypothyroidism 07/09/2007  . DYSLIPIDEMIA 07/09/2007  . HYPERCALCEMIA 07/09/2007  . Essential hypertension 07/09/2007  . ALLERGIC RHINITIS 07/09/2007  . BRONCHIECTASIS 07/09/2007  . RENAL INSUFFICIENCY 07/09/2007  . OSTEOARTHRITIS, KNEES, BILATERAL 07/09/2007  . Backache 07/09/2007  . GRAVE'S DISEASE, HX OF 07/09/2007  . HEART MURMUR, HX OF 07/09/2007    Past Surgical History:  Procedure Laterality Date  . ABDOMINAL HYSTERECTOMY     b/c fibroids  . APPENDECTOMY    . KNEE ARTHROPLASTY Right 10/2007  . TOTAL KNEE ARTHROPLASTY Bilateral     OB History    No data available       Home Medications    Prior to Admission medications   Medication Sig Start Date End Date Taking? Authorizing Provider  acetaminophen (TYLENOL) 500 MG tablet Take 500 mg by mouth every 6 (six) hours as needed.    [provider]  amLODipine (NORVASC) 5 MG tablet Take 5 mg by mouth 2 (two) times daily. Reported on 05/14/2016 01/23/16   [provider]  BUTRANS 5 MCG/HR PTWK patch Place 1 patch (5 mcg total) onto the skin once a week. Thursday 07/19/16   Kirt Boys, DO  citalopram (CELEXA)  20 MG tablet Take 20 mg by mouth daily.    [provider]  furosemide (LASIX) 20 MG tablet  10/18/16   [provider]  gabapentin (NEURONTIN) 100 MG capsule Take 100 mg by mouth 3 (three) times daily.    [provider]  levothyroxine (SYNTHROID, LEVOTHROID) 100 MCG tablet Take 100 mcg by mouth daily before breakfast.     [provider]  loratadine (CLARITIN) 10 MG tablet TAKE 1 TABLET (10 MG TOTAL) BY MOUTH  DAILY. 02/13/17   Leslye Peer, MD  losartan (COZAAR) 100 MG tablet Take 100 mg by mouth daily.    [provider]  ondansetron (ZOFRAN) 8 MG tablet Take 1 tablet (8 mg total) by mouth every 8 (eight) hours as needed for nausea or vomiting. 06/20/16   Mancel Bale, MD  pantoprazole (PROTONIX) 40 MG tablet TAKE 1 TABLET BY MOUTH DAILY 06/25/16   Bevelyn Ngo, NP  potassium chloride SA (K-DUR,KLOR-CON) 20 MEQ tablet Take 1 tablet (20 mEq total) by mouth 2 (two) times daily. 06/20/16   Mancel Bale, MD    Family History Family History  Problem Relation Age of Onset  . Kidney cancer Brother 64  . Prostate cancer Brother   . Colon cancer Brother   . Breast cancer Maternal Aunt   . Liver cancer Maternal Uncle   . Colon polyps Brother   . Heart disease Mother   . Kidney disease Mother        on dialysis  . Irritable bowel syndrome Maternal Aunt     Social History Social History  Substance Use Topics  . Smoking status: Former Smoker    Packs/day: 1.00    Years: 10.00    Types: Cigarettes    Quit date: 11/11/1976  . Smokeless tobacco: Never Used  . Alcohol use No     Allergies   Cauliflower [brassica oleracea italica]; Codeine; Fish allergy; Lactose intolerance (gi); Morphine and related; Oxycodone; Penicillins; Sulfonamide derivatives; Tramadol hcl; and Triamcinolone   Review of Systems Review of Systems All other systems negative except as documented in the HPI. All pertinent positives and negatives as reviewed in the HPI.  Physical Exam Updated Vital Signs BP 139/76   Pulse 88   Temp 98.6 F (37 C) (Oral)   Resp 20   SpO2 100%   Physical Exam  Constitutional: She is oriented to person, place, and time. She appears well-developed and well-nourished. No distress.  HENT:  Head: Normocephalic and atraumatic.  Mouth/Throat: Oropharynx is clear and moist.  Eyes: Pupils are equal, round, and reactive to light.  Neck: Normal range of motion. Neck supple.    Cardiovascular: Normal rate, regular rhythm and normal heart sounds.  Exam reveals no gallop and no friction rub.   No murmur heard. Pulmonary/Chest: Effort normal and breath sounds normal. No respiratory distress. She has no wheezes.  Abdominal: Soft. Bowel sounds are normal. She exhibits no distension and no mass. There is tenderness. There is no rebound and no guarding.  Musculoskeletal: She exhibits no edema.  Neurological: She is alert and oriented to person, place, and time. She exhibits normal muscle tone. Coordination normal.  Skin: Skin is warm and dry. Capillary refill takes less than 2 seconds. No rash noted. No erythema.  Psychiatric: She has a normal mood and affect. Her behavior is normal.  Nursing note and vitals reviewed.    ED Treatments / Results  Labs (all labs ordered are listed, but only abnormal results are displayed) Labs  Reviewed  COMPREHENSIVE METABOLIC PANEL - Abnormal; Notable for the following:       Result Value   Creatinine, Ser 1.39 (*)    Calcium 10.7 (*)    AST 13 (*)    ALT 8 (*)    GFR calc non Af Amer 36 (*)    GFR calc Af Amer 42 (*)    All other components within normal limits  CBC - Abnormal; Notable for the following:    Hemoglobin 11.4 (*)    HCT 35.0 (*)    MCV 74.3 (*)    MCH 24.2 (*)    RDW 17.5 (*)    All other components within normal limits  URINALYSIS, ROUTINE W REFLEX MICROSCOPIC - Abnormal; Notable for the following:    APPearance CLOUDY (*)    Hgb urine dipstick SMALL (*)    Protein, ur 30 (*)    Nitrite POSITIVE (*)    Leukocytes, UA LARGE (*)    Bacteria, UA MANY (*)    Squamous Epithelial / LPF 0-5 (*)    All other components within normal limits  LIPASE, BLOOD  I-STAT CG4 LACTIC ACID, ED    EKG  EKG Interpretation None       Radiology Ct Abdomen Pelvis W Contrast  Result Date: 04/09/2017 CLINICAL DATA:  Left lower quadrant pain with diarrhea for 3-4 days. EXAM: CT ABDOMEN AND PELVIS WITH CONTRAST  TECHNIQUE: Multidetector CT imaging of the abdomen and pelvis was performed using the standard protocol following bolus administration of intravenous contrast. CONTRAST:  80mL ISOVUE-300 IOPAMIDOL (ISOVUE-300) INJECTION 61% COMPARISON:  None. FINDINGS: Lower chest: No acute abnormality. Hepatobiliary: 1.8 x 1.9 cm hypodense, fluid attenuating hepatic mass most consistent with a cyst along the anterior margin. No other hepatic mass. Cholelithiasis without gallbladder wall thickening, pericholecystic inflammatory changes or biliary dilatation. Pancreas: Unremarkable. No pancreatic ductal dilatation or surrounding inflammatory changes. Spleen: Normal in size without focal abnormality. Adrenals/Urinary Tract: Adrenal glands are unremarkable. 4.3 x 4.8 cm hypodense, fluid attenuating left renal mass most consistent with a cyst. 2.8 x 2.9 cm hypodense, fluid attenuating right upper pole renal mass most consistent with a cyst. Other smaller hypodensities scattered within the kidneys most consistent with cysts. Bladder is unremarkable. Stomach/Bowel: Stomach is within normal limits. No evidence of bowel wall thickening, distention, or inflammatory changes. Vascular/Lymphatic: Abdominal aortic atherosclerosis. No abdominal aortic aneurysm. Mildly enlarged left inguinal lymph node measuring 1.5 x 2.1 cm. Reproductive: Status post hysterectomy. No adnexal masses. Other: No abdominal wall hernia or abnormality. No abdominopelvic ascites. Musculoskeletal: Grade 1 anterolisthesis of L4 on L5. Degenerative disc disease with disc height loss at L4-5 and L5-S1 with severe bilateral facet arthropathy. Mild osteoarthritis of bilateral sacroiliac joints. IMPRESSION: 1. No acute abdominal or pelvic pathology. 2. Cholelithiasis. 3.  Aortic Atherosclerosis (ICD10-170.0) 4. Lower lumbar spine spondylosis. Electronically Signed   By: Elige KoHetal  Patel   On: 04/09/2017 14:23    Procedures Procedures (including critical care  time)  Medications Ordered in ED Medications  ondansetron [facility](ZOFRAN) injection 4 mg (4 mg Intravenous Given 04/09/17 1252)  sodium chloride 0.9 % bolus 500 mL (0 mLs Intravenous Stopped 04/09/17 1435)  iopamidol (ISOVUE-300) 61 % injection (80 mLs  Contrast Given 04/09/17 1356)     Initial Impression / Assessment and Plan / ED Course  I have reviewed the triage vital signs and the nursing notes.  Pertinent labs & imaging results that were available during my care of the patient were reviewed by me and considered in  my medical decision making (see chart for details).     Patient be treated for urinary tract infection.  Told to return here as needed and also advised to have her follow-up with her primary doctor.  Patient agrees the plan and all questions answered.  She does not appear septic or having any declining status here in the emergency department.  She was given a small amount of IV fluids  Final Clinical Impressions(s) / ED Diagnoses   Final diagnoses:  None    New Prescriptions New Prescriptions   No medications on file     Charlestine Night, PA-C 04/09/17 1505    Pricilla Loveless, MD 04/16/17 2348

## 2017-04-09 NOTE — Discharge Instructions (Signed)
Return here as needed.  Followup with your primary care Dr. increase your fluid intake °

## 2017-04-09 NOTE — ED Triage Notes (Signed)
Pt sts N/V/D x 4 days with some abd pain

## 2017-04-11 LAB — URINE CULTURE: Culture: >=100000 — AB

## 2017-04-12 ENCOUNTER — Telehealth: Payer: Self-pay

## 2017-04-12 NOTE — Telephone Encounter (Signed)
Post ED Visit - Positive Culture Follow-up  Culture report reviewed by antimicrobial stewardship pharmacist:  []  Enzo BiNathan Batchelder, Pharm.D. []  Celedonio MiyamotoJeremy Frens, Pharm.D., BCPS AQ-ID []  Garvin FilaMike Maccia, Pharm.D., BCPS []  Georgina PillionElizabeth Martin, [address] BCPS []  SilverdaleMinh Pham, [address]Pharm[address]D., BCPS, AAHIVP []  Estella HuskMichelle Turner, Pharm.D., BCPS, AAHIVP []  Lysle Pearlachel Rumbarger, PharmD, BCPS [x]  Casilda Carlsaylor Stone, PharmD, BCPS []  Pollyann SamplesAndy Johnston, PharmD, BCPS  Positive urine culture Treated with Cephalexin, organism sensitive to the same and no further patient follow-up is required at this time.  Jerry CarasCullom, Arieal Cuoco Burnett 04/12/2017, 9:14 AM

## 2017-04-15 DIAGNOSIS — M810 Age-related osteoporosis without current pathological fracture: Secondary | ICD-10-CM | POA: Diagnosis not present

## 2017-04-15 DIAGNOSIS — D649 Anemia, unspecified: Secondary | ICD-10-CM | POA: Diagnosis not present

## 2017-04-15 DIAGNOSIS — D509 Iron deficiency anemia, unspecified: Secondary | ICD-10-CM | POA: Diagnosis not present

## 2017-04-15 DIAGNOSIS — I10 Essential (primary) hypertension: Secondary | ICD-10-CM | POA: Diagnosis not present

## 2017-04-21 DIAGNOSIS — N3000 Acute cystitis without hematuria: Secondary | ICD-10-CM | POA: Diagnosis not present

## 2017-04-21 DIAGNOSIS — E039 Hypothyroidism, unspecified: Secondary | ICD-10-CM | POA: Diagnosis not present

## 2017-04-21 DIAGNOSIS — E559 Vitamin D deficiency, unspecified: Secondary | ICD-10-CM | POA: Diagnosis not present

## 2017-04-21 DIAGNOSIS — Z0000 Encounter for general adult medical examination without abnormal findings: Secondary | ICD-10-CM | POA: Diagnosis not present

## 2017-05-26 ENCOUNTER — Other Ambulatory Visit: Payer: Self-pay | Admitting: Acute Care

## 2017-06-29 ENCOUNTER — Other Ambulatory Visit: Payer: Self-pay | Admitting: Emergency Medicine

## 2017-07-05 ENCOUNTER — Other Ambulatory Visit: Payer: Self-pay | Admitting: Emergency Medicine

## 2017-07-10 ENCOUNTER — Other Ambulatory Visit: Payer: Self-pay | Admitting: Emergency Medicine

## 2017-07-10 IMAGING — CT CT CHEST HIGH RESOLUTION W/O CM
2 of 7 series · 14 of 36 positions shown, 17 images · non-contrast
Comparison: Chest CT 12/19/2010.

CLINICAL DATA: 74-year-old female with history of bronchiectasis.
Recent bronchitis 3 weeks ago. Status post antibiotic therapy.

EXAM:
CT CHEST WITHOUT CONTRAST
TECHNIQUE: Multidetector CT imaging of the chest was performed following the
standard protocol without intravenous contrast. High resolution
imaging of the lungs, as well as inspiratory and expiratory imaging,
was performed.

[Series 4: high resolution · axial · 0.56mm/px · z∈[-169,+31]mm · 11 of 49 slices shown, 14 images]
[im 5/49  mediastinal]
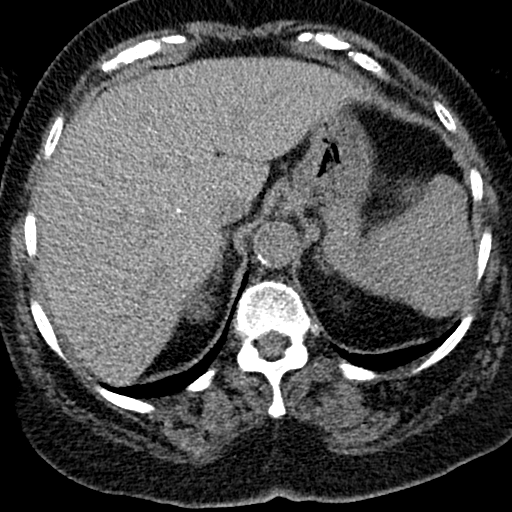
[im 5/49  lung]
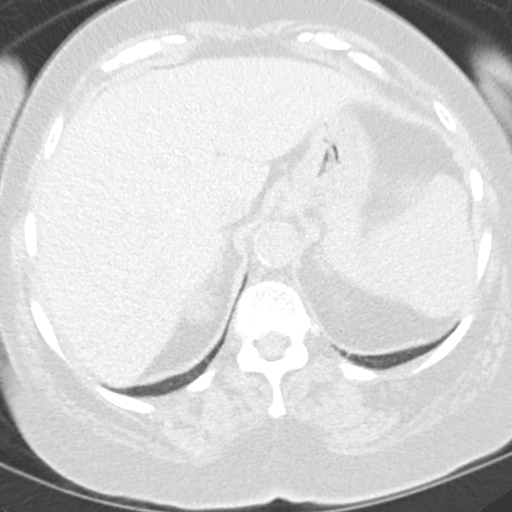
[im 9/49  lung]
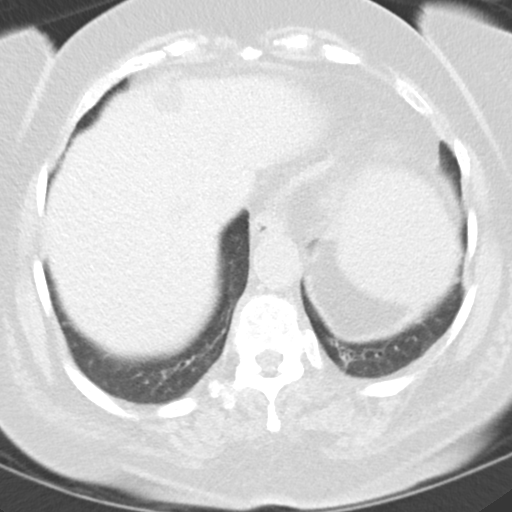
[im 13/49  lung]
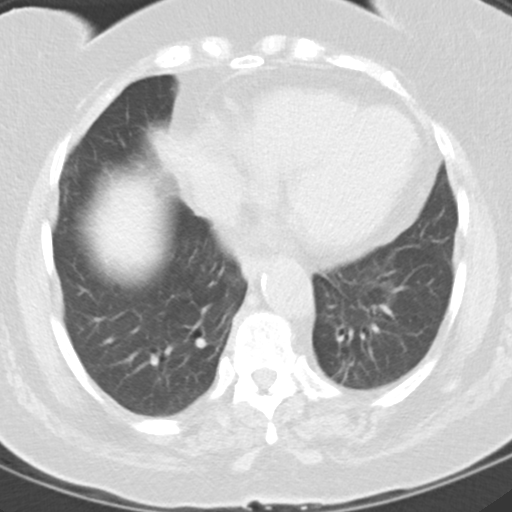
[im 17/49  lung]
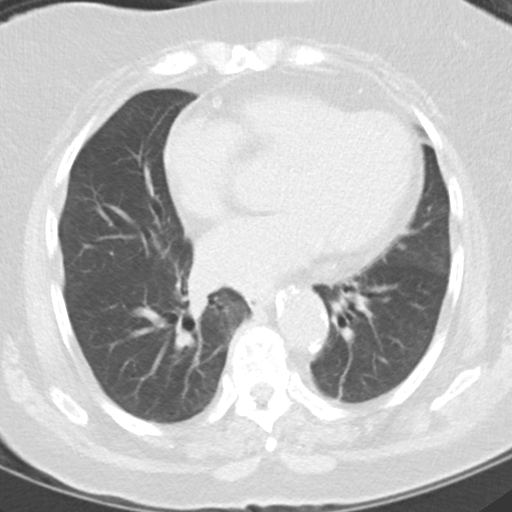
[im 21/49  mediastinal]
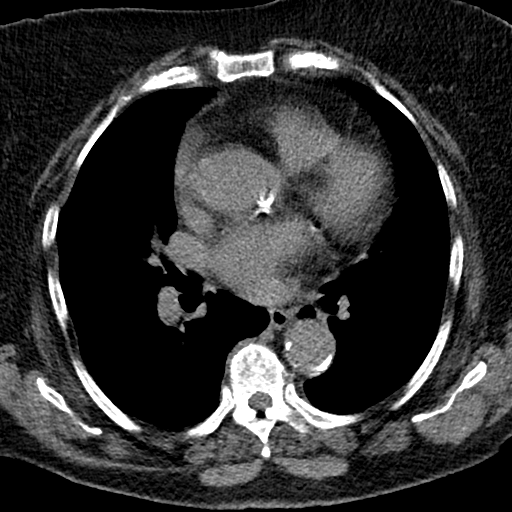
[im 21/49  lung]
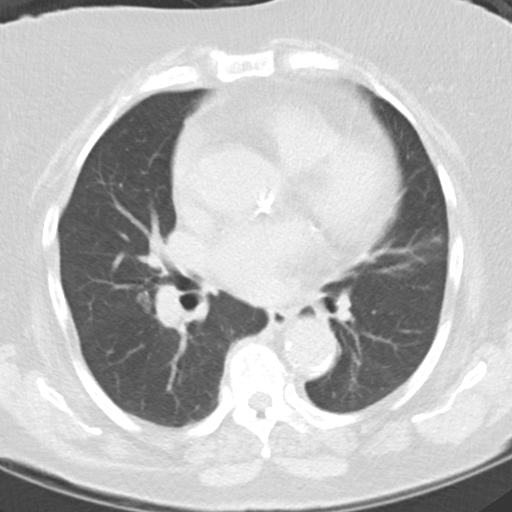
[im 25/49  lung]
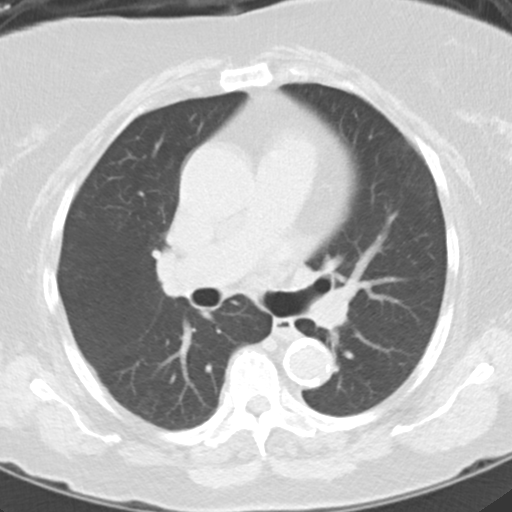
[im 29/49  lung]
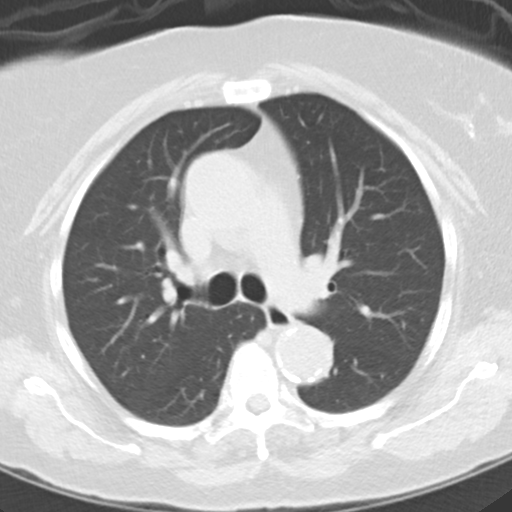
[im 33/49  lung]
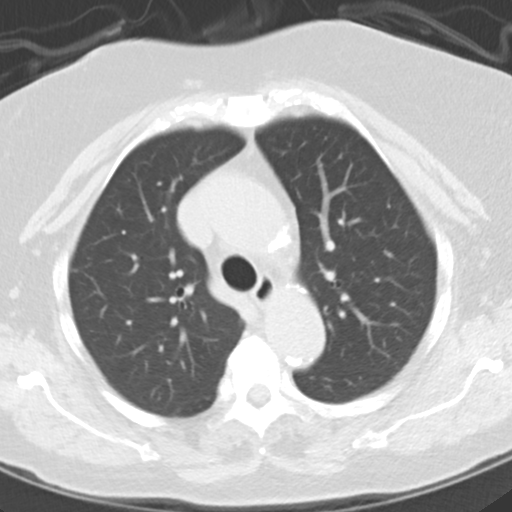
[im 37/49  mediastinal]
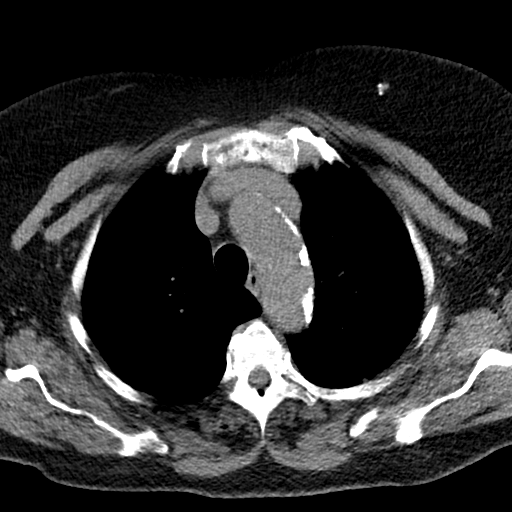
[im 37/49  lung]
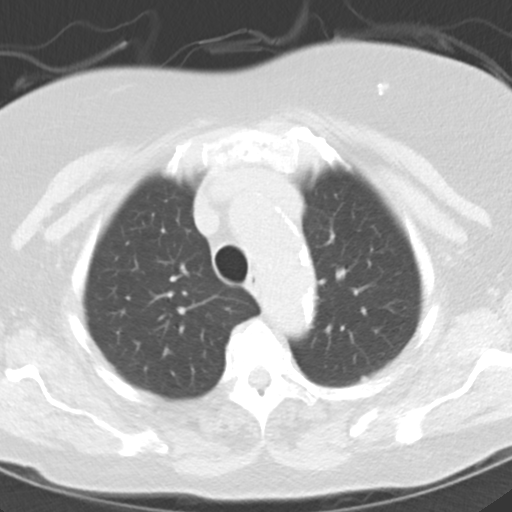
[im 41/49  lung]
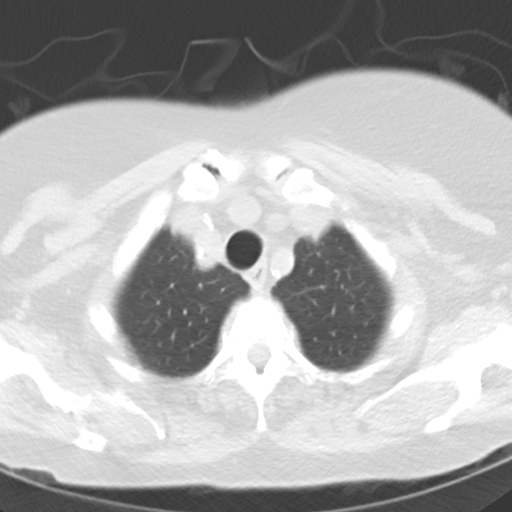
[im 45/49  lung]
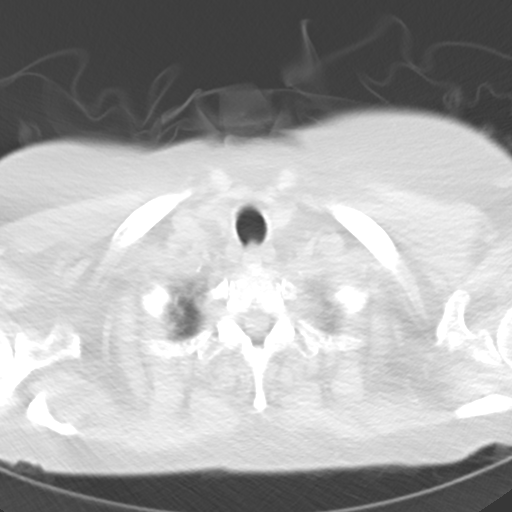

[Series 8: coronal · coronal · 0.55mm/px · 3 of 121 slices shown]
[im 25/121  lung]
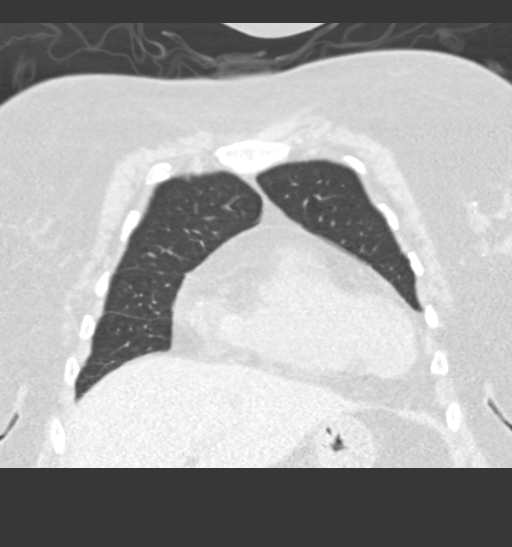
[im 49/121  lung]
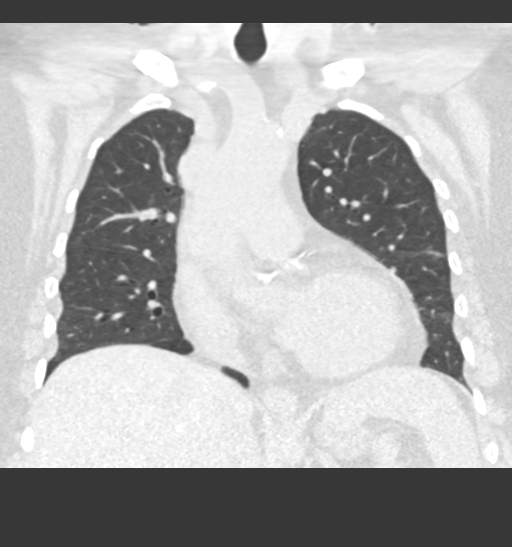
[im 73/121  lung]
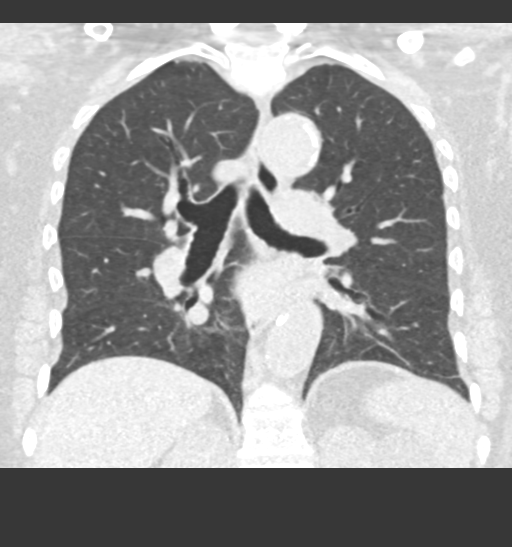

[14 of 36 positions shown; findings below may reference images not displayed]

FINDINGS: Mediastinum/Lymph Nodes: Heart size is normal. There is no
significant pericardial fluid, thickening or pericardial
calcification. There is atherosclerosis of the thoracic aorta, the
great vessels of the mediastinum and the coronary arteries,
including calcified atherosclerotic plaque in the left main, left
anterior descending, left circumflex and right coronary arteries. No
pathologically enlarged mediastinal or hilar lymph nodes. Please
note that accurate exclusion of hilar adenopathy is limited on
noncontrast CT scans. Esophagus is unremarkable in appearance. No
axillary lymphadenopathy.

Lungs/Pleura: Tiny calcified granuloma in the dependent portion of
the left lower lobe. No other suspicious appearing pulmonary nodules
or masses. No acute consolidative airspace disease. No pleural
effusions. High-resolution images demonstrate some mild lower lung
predominant cylindrical bronchiectasis, most severe in the left
lower lobe where there is also some thickening of the
peribronchovascular interstitium cut of focal architectural
distortion and volume loss, likely sequela of prior infection. There
are no more generalized areas of ground-glass attenuation,
subpleural reticulation or honeycombing to suggest underlying
interstitial lung disease. Inspiratory and expiratory imaging is
unremarkable.

Upper abdomen: In segment 4A of the liver there is again a
well-circumscribed 2.0 cm low-attenuation lesion which is
incompletely characterized on today's noncontrast CT examination,
but is similar to the prior study, statistically likely a small
cyst. Small calcified granuloma in the central aspect of the liver.
Calcification in the upper pole of the right kidney incompletely
visualized but measuring at least 3 mm, potentially a nonobstructive
calculus exophytic incompletely visualized low-attenuation lesion
associated with the upper pole of the left kidney measuring at least
4.4 cm in diameter is incompletely characterized, but likely a cyst.
Atherosclerosis.

Musculoskeletal: There are no aggressive appearing lytic or blastic
lesions noted in the visualized portions of the skeleton.
IMPRESSION: 1. Lower lobe predominant cylindrical bronchiectasis and post
infectious or inflammatory scarring, most severe in the left lower
lobe, as above.
2. No other findings to suggest interstitial lung disease.
3. No consolidative airspace disease to suggest active acute
infection.
4. Sequela of old granulomatous disease, as above.
5. Atherosclerosis, including left main and 3 vessel coronary artery
disease. Assessment for potential risk factor modification, dietary
therapy or pharmacologic therapy may be warranted, if clinically
indicated.
6. 3 mm calcification in the upper of the right kidney may represent
a nonobstructive calculus.
7. Additional incidental findings, as above.

## 2017-07-14 ENCOUNTER — Other Ambulatory Visit: Payer: Self-pay | Admitting: Emergency Medicine

## 2017-07-21 DIAGNOSIS — E559 Vitamin D deficiency, unspecified: Secondary | ICD-10-CM | POA: Diagnosis not present

## 2017-07-21 DIAGNOSIS — E039 Hypothyroidism, unspecified: Secondary | ICD-10-CM | POA: Diagnosis not present

## 2017-07-22 DIAGNOSIS — E039 Hypothyroidism, unspecified: Secondary | ICD-10-CM | POA: Diagnosis not present

## 2017-07-22 DIAGNOSIS — R05 Cough: Secondary | ICD-10-CM | POA: Diagnosis not present

## 2017-07-22 DIAGNOSIS — B0229 Other postherpetic nervous system involvement: Secondary | ICD-10-CM | POA: Diagnosis not present

## 2017-07-22 DIAGNOSIS — I10 Essential (primary) hypertension: Secondary | ICD-10-CM | POA: Diagnosis not present

## 2017-07-22 DIAGNOSIS — E559 Vitamin D deficiency, unspecified: Secondary | ICD-10-CM | POA: Diagnosis not present

## 2017-07-26 ENCOUNTER — Other Ambulatory Visit: Payer: Self-pay | Admitting: Emergency Medicine

## 2017-09-29 DIAGNOSIS — E785 Hyperlipidemia, unspecified: Secondary | ICD-10-CM | POA: Diagnosis not present

## 2017-09-29 DIAGNOSIS — E039 Hypothyroidism, unspecified: Secondary | ICD-10-CM | POA: Diagnosis not present

## 2017-09-29 DIAGNOSIS — N2581 Secondary hyperparathyroidism of renal origin: Secondary | ICD-10-CM | POA: Diagnosis not present

## 2017-09-29 DIAGNOSIS — D631 Anemia in chronic kidney disease: Secondary | ICD-10-CM | POA: Diagnosis not present

## 2017-09-29 DIAGNOSIS — N183 Chronic kidney disease, stage 3 (moderate): Secondary | ICD-10-CM | POA: Diagnosis not present

## 2017-11-17 ENCOUNTER — Other Ambulatory Visit: Payer: Self-pay | Admitting: Emergency Medicine

## 2017-11-18 ENCOUNTER — Other Ambulatory Visit: Payer: Self-pay | Admitting: Emergency Medicine

## 2017-11-28 DIAGNOSIS — R300 Dysuria: Secondary | ICD-10-CM | POA: Diagnosis not present

## 2017-11-28 DIAGNOSIS — E039 Hypothyroidism, unspecified: Secondary | ICD-10-CM | POA: Diagnosis not present

## 2017-11-28 DIAGNOSIS — E559 Vitamin D deficiency, unspecified: Secondary | ICD-10-CM | POA: Diagnosis not present

## 2017-11-28 DIAGNOSIS — I10 Essential (primary) hypertension: Secondary | ICD-10-CM | POA: Diagnosis not present

## 2017-12-19 DIAGNOSIS — I10 Essential (primary) hypertension: Secondary | ICD-10-CM | POA: Diagnosis not present

## 2017-12-19 DIAGNOSIS — E559 Vitamin D deficiency, unspecified: Secondary | ICD-10-CM | POA: Diagnosis not present

## 2017-12-19 DIAGNOSIS — E039 Hypothyroidism, unspecified: Secondary | ICD-10-CM | POA: Diagnosis not present

## 2017-12-26 DIAGNOSIS — R05 Cough: Secondary | ICD-10-CM | POA: Diagnosis not present

## 2017-12-26 DIAGNOSIS — K7689 Other specified diseases of liver: Secondary | ICD-10-CM | POA: Diagnosis not present

## 2017-12-26 DIAGNOSIS — E559 Vitamin D deficiency, unspecified: Secondary | ICD-10-CM | POA: Diagnosis not present

## 2017-12-26 DIAGNOSIS — I10 Essential (primary) hypertension: Secondary | ICD-10-CM | POA: Diagnosis not present

## 2018-01-20 DIAGNOSIS — R05 Cough: Secondary | ICD-10-CM | POA: Diagnosis not present

## 2018-01-21 ENCOUNTER — Ambulatory Visit (INDEPENDENT_AMBULATORY_CARE_PROVIDER_SITE_OTHER): Payer: Medicare Other | Admitting: Pulmonary Disease

## 2018-01-21 ENCOUNTER — Encounter: Payer: Self-pay | Admitting: Pulmonary Disease

## 2018-01-21 ENCOUNTER — Ambulatory Visit (INDEPENDENT_AMBULATORY_CARE_PROVIDER_SITE_OTHER)
Admission: RE | Admit: 2018-01-21 | Discharge: 2018-01-21 | Disposition: A | Discharge status: Home / Self Care | Payer: Medicare Other | Source: Ambulatory Visit | Attending: Pulmonary Disease | Admitting: Pulmonary Disease

## 2018-01-21 VITALS — BP 130/80 | HR 97 | Ht 65.5 in | Wt 237.0 lb

## 2018-01-21 DIAGNOSIS — J479 Bronchiectasis, uncomplicated: Secondary | ICD-10-CM

## 2018-01-21 DIAGNOSIS — R05 Cough: Secondary | ICD-10-CM | POA: Diagnosis not present

## 2018-01-21 DIAGNOSIS — R059 Cough, unspecified: Secondary | ICD-10-CM

## 2018-01-21 DIAGNOSIS — R0600 Dyspnea, unspecified: Secondary | ICD-10-CM | POA: Diagnosis not present

## 2018-01-21 MED ORDER — FLUTTER DEVI: 0 refills | Status: AC

## 2018-01-21 MED ORDER — DOXYCYCLINE HYCLATE 100 MG PO TABS: 100.0000 mg | ORAL_TABLET | Freq: Two times a day (BID) | ORAL | 0 refills | Status: DC

## 2018-01-21 NOTE — Patient Instructions (Signed)
Bronchiectasis with acute exacerbation: Please provide us with a sample of your mucus, we will test for bacteria, fungus, and AFB organisms Take doxycycline 100 mg twice a day times 7 days Use the flutter valve 4-5 breaths 4-5 times a day Take extra strength Mucinex 1200 mg twice a day Drink plenty of fluids We will get a chest x-ray  Follow-up with either Dr. Delton CoombesByrum or 1 of our nurse practitioners in 3-4 weeks to go over the results of the culture

## 2018-01-21 NOTE — Progress Notes (Signed)
Subjective:    Patient ID: [name], female    DOB: [birth year], 77 y.o.   MRN: [id number]  Synopsis: Patient of Dr. Ortencia Kick with bronchiectasis Dr. Corliss Blacker summarizes her clinical situation as follows:        [name] is a 77 year old woman with a history of hypertension, hypercholesterolemia, hypothyroidism, allergic rhinitis, and renal insufficiency followed by Dr. Elvis Coil. I saw her in 2007-8 for pulmonary nodules, mediastinal LAD and basilar bronchiectasis on CT scan. Bx of an axillary node showed no evidence for sarcoidosis.          HPI Chief Complaint  Patient presents with  . Follow-up    For 2-3 weeks has had cold was put on antibiotics on 01/14/18, SOB with activity,Productive cough green/yellow in color.    [name] has been wheezing and coughing up mucus for several weeks now.  In fact she was seen by her PCP several weeks ago and givne an antibiotic which ehlped, but not long after stopping the antibiotic she started coughing up green thick mucus again.  The antibiotic initially thinned out her mucus but now it's worse.  She's had chills, no fever.  Some leg swelling, that's been going on for a weeks.  She says that she has been around some children which have been sick recently.  She does feel a bit more short of breath than normal.  She does not have a flutter valve at home.  Past Medical History:  Diagnosis Date  . Allergic rhinitis   . Anxiety   . Back pain   . Bronchiectasis   . Dyslipidemia   . Dysuria 10/14/2013  . Fatigue 08/02/2013  . GERD (gastroesophageal reflux disease)   . History of Graves' disease   . Hx of colonic polyp   . Hypercalcemia   . Hyperlipidemia   . Hypertension   . Hypokalemia 11/15/2013  . 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 Murmur, heart   . Osteoarthritis, knee   . Renal insufficiency   . Thoracic aortic aneurysm (HCC)   . Thrombocytopenia, unspecified (HCC) 08/02/2013  . Thrush 10/14/2013  . Thrush, oral  09/02/2013  . Thyroid disease   . UTI (lower urinary tract infection) 10/14/2013      Review of Systems  Constitutional: Positive for fatigue. Negative for chills and fever.  HENT: Negative for postnasal drip, rhinorrhea and sinus pain.   Respiratory: Positive for cough, shortness of breath and wheezing.   Cardiovascular: Positive for leg swelling. Negative for chest pain and palpitations.       Objective:   Physical Exam Vitals:   01/21/18 1041  BP: 130/80  Pulse: 97  SpO2: 96%  Weight: 237 lb (107.5 kg)  Height: 5' 5.5" (1.664 m)   Room air  Gen: obese, chronically ill appearing HENT: OP clear, TM's clear, neck supple PULM: Some crackles bases, no wheezing, normal percussion CV: RRR, no mgr, trace edema GI: BS+, soft, nontender Derm: no cyanosis or rash Psyche: normal mood and affect   CBC    Component Value Date/Time   WBC 5.9 04/09/2017 0946   RBC 4.71 04/09/2017 0946   HGB 11.4 (L) 04/09/2017 0946   HGB 10.6 (L) 10/31/2016 1224   HCT 35.0 (L) 04/09/2017 0946   HCT 33.8 (L) 10/31/2016 1224   PLT 166 04/09/2017 0946   PLT 224 10/31/2016 1224   MCV 74.3 (L) 04/09/2017 0946   MCV 80.2 10/31/2016 1224   MCH 24.2 (L) 04/09/2017 0946   MCHC 32.6 04/09/2017  0946   RDW 17.5 (H) 04/09/2017 0946   RDW 17.0 (H) 10/31/2016 1224   LYMPHSABS 2.1 10/31/2016 1224   MONOABS 0.6 10/31/2016 1224   EOSABS 0.3 10/31/2016 1224   BASOSABS 0.1 10/31/2016 1224    BMET    Component Value Date/Time   NA 139 04/09/2017 0946   NA 141 02/05/2016 1124   K 3.8 04/09/2017 0946   K 3.7 02/05/2016 1124   CL 105 04/09/2017 0946   CO2 26 04/09/2017 0946   CO2 28 02/05/2016 1124   GLUCOSE 98 04/09/2017 0946   GLUCOSE 79 02/05/2016 1124   BUN 11 04/09/2017 0946   BUN 18.3 02/05/2016 1124   CREATININE 1.39 (H) 04/09/2017 0946   CREATININE 1.2 (H) 02/05/2016 1124   CALCIUM 10.7 (H) 04/09/2017 0946   CALCIUM 9.7 02/05/2016 1124   GFRNONAA 36 (L) 04/09/2017 0946   GFRAA 42 (L)  04/09/2017 0946    Records from her December 2017 visit with hematology and oncology reviewed, she is followed for ITP which is stable.  Chest imaging: 2017 CT chest images independently reviewed showing basilar bronchiectasis in both lower lobes     Assessment & Plan:   BRONCHIECTASIS - Plan: DG Chest 2 View, Fungus Culture & Smear, Respiratory or Resp and Sputum Culture, AFB Culture & Smear  Cough  Discussion: [name] is here to see [name] for the first time in 2 years.  She is having an exacerbation of chronic bronchiectasis.  She has bronchiectasis based on a high-resolution CT scan of her chest.  This is likely related to a recent infection, she is at high risk for bacterial infection given her bronchiectasis.  She is not using anything for mucociliary clearance right now.  Plan: Bronchiectasis with acute exacerbation: Please provide [name] with a sample of your mucus, we will test for bacteria, fungus, and AFB organisms Take doxycycline 100 mg twice a day times 7 days Use the flutter valve 4-5 breaths 4-5 times a day Take extra strength Mucinex 1200 mg twice a day Drink plenty of fluids We will get a chest x-ray  Follow-up with either Dr. Delton CoombesByrum or 1 of our nurse practitioners in 3-4 weeks to go over the results of the culture     Current Outpatient Medications:  .  acetaminophen (TYLENOL) 500 MG tablet, Take 500 mg by mouth every 6 (six) hours as needed., Disp: , Rfl:  .  amLODipine (NORVASC) 5 MG tablet, Take 5 mg by mouth 2 (two) times daily. Reported on 05/14/2016, Disp: , Rfl: 4 .  citalopram (CELEXA) 20 MG tablet, Take 20 mg by mouth daily., Disp: , Rfl:  .  furosemide (LASIX) 20 MG tablet, , Disp: , Rfl:  .  levothyroxine (SYNTHROID, LEVOTHROID) 100 MCG tablet, Take 100 mcg by mouth daily before breakfast. , Disp: , Rfl:  .  loratadine (CLARITIN) 10 MG tablet, TAKE 1 TABLET (10 MG TOTAL) BY MOUTH DAILY., Disp: 90 tablet, Rfl: 1 .  losartan (COZAAR) 100 MG tablet, Take 100 mg  by mouth daily., Disp: , Rfl:  .  ondansetron (ZOFRAN) 8 MG tablet, Take 1 tablet (8 mg total) by mouth every 8 (eight) hours as needed for nausea or vomiting., Disp: 20 tablet, Rfl: 0 .  pantoprazole (PROTONIX) 40 MG tablet, TAKE 1 TABLET BY MOUTH DAILY, Disp: 30 tablet, Rfl: 0 .  potassium chloride SA (K-DUR,KLOR-CON) 20 MEQ tablet, Take 1 tablet (20 mEq total) by mouth 2 (two) times daily., Disp: 20 tablet, Rfl: 0 .  BUTRANS  5 MCG/HR PTWK patch, Place 1 patch (5 mcg total) onto the skin once a week. Thursday (Patient not taking: Reported on 01/21/2018), Disp: 4 patch, Rfl: 0 .  doxycycline (VIBRA-TABS) 100 MG tablet, Take 1 tablet (100 mg total) by mouth 2 (two) times daily., Disp: 14 tablet, Rfl: 0 .  LYRICA 150 MG capsule, Take 150 mg by mouth 2 (two) times daily., Disp: , Rfl: 5 .  Respiratory Therapy Supplies (FLUTTER) DEVI, Use as directed, Disp: 1 each, Rfl: 0

## 2018-01-22 ENCOUNTER — Other Ambulatory Visit: Payer: Medicare Other

## 2018-01-22 DIAGNOSIS — J479 Bronchiectasis, uncomplicated: Secondary | ICD-10-CM

## 2018-02-13 ENCOUNTER — Ambulatory Visit: Payer: Medicare Other | Admitting: Pulmonary Disease

## 2018-02-13 ENCOUNTER — Ambulatory Visit (INDEPENDENT_AMBULATORY_CARE_PROVIDER_SITE_OTHER): Payer: Medicare Other | Admitting: Adult Health

## 2018-02-13 ENCOUNTER — Encounter: Payer: Self-pay | Admitting: Adult Health

## 2018-02-13 DIAGNOSIS — J479 Bronchiectasis, uncomplicated: Secondary | ICD-10-CM | POA: Diagnosis not present

## 2018-02-13 NOTE — Assessment & Plan Note (Signed)
Recent flare now resolving after abx Cont flutter valve.   Plan  Patient Instructions  Use Mucinex Twice daily  As needed  Cough/congestion  Flutter valve As needed  For cough  Follow up with Dr. Delton CoombesByrum  In 6 months and As needed

## 2018-02-13 NOTE — Progress Notes (Signed)
 @Patient  ID: [name] FantasiaGloria Wahlert, female    DOB: May 22, 1941, 77 y.o.   MRN: [id number]  Chief Complaint  Patient presents with  . Follow-up    Bronchiectasis     Referring provider: Pearson GrippeKim, James, MD  HPI: [id number] year old female former smoker followed for bronchiectasis   02/13/2018 Follow up ; Bronchiectasis  Patient returns for a 2-week follow-up.  Patient was recently seen in the office for an acute bronchiectatic exacerbation.  She was treated with a 7-day course of doxycycline and instructed to use her flutter valve several times a day. .  She feels that her cough and congestion have decreased.  She is now able to get up thick mucus that was difficult to cough up before.  She feels that her breathing is getting back to baseline.  She denies any chest pain orthopnea PND or leg swelling. Chest xray last visit was normal /nad. Sputum cx showed only yeast, neg AFB /fungus   Allergies  Allergen Reactions  . Cauliflower [Brassica Oleracea Italica]     Showed up on allergy test  . Codeine Nausea And Vomiting    REACTION: Nausea/vomiting  . Fish Allergy Nausea Only  . Lactose Intolerance (Gi) Diarrhea  . Morphine And Related Nausea And Vomiting  . Oxycodone Nausea And Vomiting  . Penicillins     REACTION: headache, tremors, Nausea Has patient had a PCN reaction causing immediate rash, facial/tongue/throat swelling, SOB or lightheadedness with hypotension: No Has patient had a PCN reaction causing severe rash involving mucus membranes or skin necrosis: No Has patient had a PCN reaction that required hospitalization No Has patient had a PCN reaction occurring within the last 10 years: No If all of the above answers are "NO", then may proceed with Cephalosporin use.   . Sulfonamide Derivatives Swelling    REACTION: mouth swells  . Tramadol Hcl Other (See Comments)    REACTION: GI Intolerance  . Triamcinolone Swelling    REACTION: angio edema    Immunization History  Administered Date(s)  Administered  . Influenza Whole 12/13/2003, 10/25/2009  . Influenza-Unspecified 08/31/2014  . PPD Test 07/04/2016  . Pneumococcal Polysaccharide-23 04/11/2006  . Td 10/25/2009    Past Medical History:  Diagnosis Date  . Allergic rhinitis   . Anxiety   . Back pain   . Bronchiectasis   . Dyslipidemia   . Dysuria 10/14/2013  . Fatigue 08/02/2013  . GERD (gastroesophageal reflux disease)   . History of Graves' disease   . Hx of colonic polyp   . Hypercalcemia   . Hyperlipidemia   . Hypertension   . Hypokalemia 11/15/2013  . 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 Murmur, heart   . Osteoarthritis, knee   . Renal insufficiency   . Thoracic aortic aneurysm (HCC)   . Thrombocytopenia, unspecified (HCC) 08/02/2013  . Thrush 10/14/2013  . Thrush, oral 09/02/2013  . Thyroid disease   . UTI (lower urinary tract infection) 10/14/2013    Tobacco History: Social History   Tobacco Use  Smoking Status Former Smoker  . Packs/day: 1.00  . Years: 10.00  . Pack years: 10.00  . Types: Cigarettes  . Last attempt to quit: 11/11/1976  . Years since quitting: 41.2  Smokeless Tobacco Never Used   Counseling given: Not Answered   Outpatient Encounter Medications as of 02/13/2018  Medication Sig  . acetaminophen (TYLENOL) 500 MG tablet Take 500 mg by mouth every 6 (six) hours as needed.  Marland Kitchen. amLODipine (NORVASC) 5 MG tablet Take 5 mg  by mouth 2 (two) times daily. Reported on 05/14/2016  . BUTRANS 5 MCG/HR PTWK patch Place 1 patch (5 mcg total) onto the skin once a week. Thursday (Patient not taking: Reported on 01/21/2018)  . citalopram (CELEXA) 20 MG tablet Take 20 mg by mouth daily.  Marland Kitchen doxycycline (VIBRA-TABS) 100 MG tablet Take 1 tablet (100 mg total) by mouth 2 (two) times daily.  . furosemide (LASIX) 20 MG tablet   . levothyroxine (SYNTHROID, LEVOTHROID) 100 MCG tablet Take 100 mcg by mouth daily before breakfast.   . loratadine (CLARITIN) 10 MG tablet TAKE 1 TABLET (10 MG TOTAL)  BY MOUTH DAILY.  Marland Kitchen losartan (COZAAR) 100 MG tablet Take 100 mg by mouth daily.  Marland Kitchen LYRICA 150 MG capsule Take 150 mg by mouth 2 (two) times daily.  . ondansetron (ZOFRAN) 8 MG tablet Take 1 tablet (8 mg total) by mouth every 8 (eight) hours as needed for nausea or vomiting.  . pantoprazole (PROTONIX) 40 MG tablet TAKE 1 TABLET BY MOUTH DAILY  . potassium chloride SA (K-DUR,KLOR-CON) 20 MEQ tablet Take 1 tablet (20 mEq total) by mouth 2 (two) times daily.  Marland Kitchen Respiratory Therapy Supplies (FLUTTER) DEVI Use as directed   No facility-administered encounter medications on file as of 02/13/2018.      Review of Systems  Constitutional:   No  weight loss, night sweats,  Fevers, chills, fatigue, or  lassitude.  HEENT:   No headaches,  Difficulty swallowing,  Tooth/dental problems, or  Sore throat,                No sneezing, itching, ear ache,  +nasal congestion, post nasal drip,   CV:  No chest pain,  Orthopnea, PND, swelling in lower extremities, anasarca, dizziness, palpitations, syncope.   GI  No heartburn, indigestion, abdominal pain, nausea, vomiting, diarrhea, change in bowel habits, loss of appetite, bloody stools.   Resp:  No chest wall deformity  Skin: no rash or lesions.  GU: no dysuria, change in color of urine, no urgency or frequency.  No flank pain, no hematuria   MS:  No joint pain or swelling.  No decreased range of motion.  No back pain.    Physical Exam  BP 126/64 (BP Location: Right Arm, Cuff Size: Normal)   Pulse 95   Ht 5\' 5"  (1.651 m)   Wt 222 lb 9.6 oz (101 kg)   SpO2 95%   BMI 37.04 kg/m   GEN: A/Ox3; pleasant , NAD, obese    HEENT:  [place]/AT,  EACs-clear, TMs-wnl, NOSE-clear, THROAT-clear, no lesions, no postnasal drip or exudate noted.   NECK:  Supple w/ fair ROM; no JVD; normal carotid impulses w/o bruits; no thyromegaly or nodules palpated; no lymphadenopathy.    RESP  Clear  P & A; w/o, wheezes/ rales/ or rhonchi. no accessory muscle use, no dullness  to percussion  CARD:  RRR, no m/r/g, no peripheral edema, pulses intact, no cyanosis or clubbing.  GI:   Soft & nt; nml bowel sounds; no organomegaly or masses detected.   Musco: Warm bil, no deformities or joint swelling noted.   Neuro: alert, no focal deficits noted.    Skin: Warm, no lesions or rashes    Lab Results:   BMET  ProBNP No results found for: PROBNP  Imaging: Dg Chest 2 View  Result Date: 01/21/2018 CLINICAL DATA:  Dyspnea. EXAM: CHEST - 2 VIEW COMPARISON:  Radiographs of July 22, 2017. FINDINGS: Stable cardiomegaly. Atherosclerosis of thoracic aorta is noted.  No pneumothorax or pleural effusion is noted. No acute pulmonary disease is noted. Bony thorax is unremarkable. IMPRESSION: No active cardiopulmonary disease. Aortic Atherosclerosis (ICD10-I70.0). Electronically Signed   By: Lupita Raider, M.D.   On: 01/21/2018 16:38     Assessment & Plan:   No problem-specific Assessment & Plan notes found for this encounter.     Rubye Oaks, NP 02/13/2018

## 2018-02-13 NOTE — Patient Instructions (Addendum)
Use Mucinex Twice daily  As needed  Cough/congestion  Flutter valve As needed  For cough  Follow up with Dr. Delton CoombesByrum  In 6 months and As needed

## 2018-02-13 NOTE — Progress Notes (Deleted)
Subjective:    Patient ID: [name], female    DOB: 01-Jul-1941, 77 y.o.   MRN: [id number]  Synopsis: Patient of Dr. Delton CoombesByrum with bronchiectasis Dr. Delton CoombesByrum summarizes her clinical situation as follows:        Ms. [name] is a 77 year old woman with a history of hypertension, hypercholesterolemia, hypothyroidism, allergic rhinitis, and renal insufficiency followed by Dr. Elvis CoilMartin Webb. I saw her in 2007-8 for pulmonary nodules, mediastinal LAD and basilar bronchiectasis on CT scan. Bx of an axillary node showed no evidence for sarcoidosis.          HPI No chief complaint on file.  ***  Past Medical History:  Diagnosis Date  . Allergic rhinitis   . Anxiety   . Back pain   . Bronchiectasis   . Dyslipidemia   . Dysuria 10/14/2013  . Fatigue 08/02/2013  . GERD (gastroesophageal reflux disease)   . History of Graves' disease   . Hx of colonic polyp   . Hypercalcemia   . Hyperlipidemia   . Hypertension   . Hypokalemia 11/15/2013  . 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 Murmur, heart   . Osteoarthritis, knee   . Renal insufficiency   . Thoracic aortic aneurysm (HCC)   . Thrombocytopenia, unspecified (HCC) 08/02/2013  . Thrush 10/14/2013  . Thrush, oral 09/02/2013  . Thyroid disease   . UTI (lower urinary tract infection) 10/14/2013      Review of Systems  Constitutional: Positive for fatigue. Negative for chills and fever.  HENT: Negative for postnasal drip, rhinorrhea and sinus pain.   Respiratory: Positive for cough, shortness of breath and wheezing.   Cardiovascular: Positive for leg swelling. Negative for chest pain and palpitations.       Objective:   Physical Exam There were no vitals filed for this visit. Room air  ***   CBC    Component Value Date/Time   WBC 5.9 04/09/2017 0946   RBC 4.71 04/09/2017 0946   HGB 11.4 (L) 04/09/2017 0946   HGB 10.6 (L) 10/31/2016 1224   HCT 35.0 (L) 04/09/2017 0946   HCT 33.8 (L) 10/31/2016 1224   PLT  166 04/09/2017 0946   PLT 224 10/31/2016 1224   MCV 74.3 (L) 04/09/2017 0946   MCV 80.2 10/31/2016 1224   MCH 24.2 (L) 04/09/2017 0946   MCHC 32.6 04/09/2017 0946   RDW 17.5 (H) 04/09/2017 0946   RDW 17.0 (H) 10/31/2016 1224   LYMPHSABS 2.1 10/31/2016 1224   MONOABS 0.6 10/31/2016 1224   EOSABS 0.3 10/31/2016 1224   BASOSABS 0.1 10/31/2016 1224    BMET    Component Value Date/Time   NA 139 04/09/2017 0946   NA 141 02/05/2016 1124   K 3.8 04/09/2017 0946   K 3.7 02/05/2016 1124   CL 105 04/09/2017 0946   CO2 26 04/09/2017 0946   CO2 28 02/05/2016 1124   GLUCOSE 98 04/09/2017 0946   GLUCOSE 79 02/05/2016 1124   BUN 11 04/09/2017 0946   BUN 18.3 02/05/2016 1124   CREATININE 1.39 (H) 04/09/2017 0946   CREATININE 1.2 (H) 02/05/2016 1124   CALCIUM 10.7 (H) 04/09/2017 0946   CALCIUM 9.7 02/05/2016 1124   GFRNONAA 36 (L) 04/09/2017 0946   GFRAA 42 (L) 04/09/2017 0946    Records from her December 2017 visit with hematology and oncology reviewed, she is followed for ITP which is stable.  Chest imaging: 2017 CT chest images independently reviewed showing basilar bronchiectasis in both lower lobes  Assessment & Plan:   No diagnosis found.  ***     Current Outpatient Medications:  .  acetaminophen (TYLENOL) 500 MG tablet, Take 500 mg by mouth every 6 (six) hours as needed., Disp: , Rfl:  .  amLODipine (NORVASC) 5 MG tablet, Take 5 mg by mouth 2 (two) times daily. Reported on 05/14/2016, Disp: , Rfl: 4 .  BUTRANS 5 MCG/HR PTWK patch, Place 1 patch (5 mcg total) onto the skin once a week. Thursday (Patient not taking: Reported on 01/21/2018), Disp: 4 patch, Rfl: 0 .  citalopram (CELEXA) 20 MG tablet, Take 20 mg by mouth daily., Disp: , Rfl:  .  doxycycline (VIBRA-TABS) 100 MG tablet, Take 1 tablet (100 mg total) by mouth 2 (two) times daily., Disp: 14 tablet, Rfl: 0 .  furosemide (LASIX) 20 MG tablet, , Disp: , Rfl:  .  levothyroxine (SYNTHROID, LEVOTHROID) 100 MCG tablet,  Take 100 mcg by mouth daily before breakfast. , Disp: , Rfl:  .  loratadine (CLARITIN) 10 MG tablet, TAKE 1 TABLET (10 MG TOTAL) BY MOUTH DAILY., Disp: 90 tablet, Rfl: 1 .  losartan (COZAAR) 100 MG tablet, Take 100 mg by mouth daily., Disp: , Rfl:  .  LYRICA 150 MG capsule, Take 150 mg by mouth 2 (two) times daily., Disp: , Rfl: 5 .  ondansetron (ZOFRAN) 8 MG tablet, Take 1 tablet (8 mg total) by mouth every 8 (eight) hours as needed for nausea or vomiting., Disp: 20 tablet, Rfl: 0 .  pantoprazole (PROTONIX) 40 MG tablet, TAKE 1 TABLET BY MOUTH DAILY, Disp: 30 tablet, Rfl: 0 .  potassium chloride SA (K-DUR,KLOR-CON) 20 MEQ tablet, Take 1 tablet (20 mEq total) by mouth 2 (two) times daily., Disp: 20 tablet, Rfl: 0 .  Respiratory Therapy Supplies (FLUTTER) DEVI, Use as directed, Disp: 1 each, Rfl: 0

## 2018-03-09 LAB — FUNGUS CULTURE W SMEAR
MICRO NUMBER: 90325575
SPECIMEN QUALITY:: ADEQUATE

## 2018-03-09 LAB — RESPIRATORY CULTURE OR RESPIRATORY AND SPUTUM CULTURE
MICRO NUMBER:: 90325577
RESULT: NORMAL
SPECIMEN QUALITY:: ADEQUATE

## 2018-03-09 LAB — MYCOBACTERIA,CULT W/FLUOROCHROME SMEAR
MICRO NUMBER: 90325576
SMEAR: NONE SEEN
SPECIMEN QUALITY: ADEQUATE

## 2018-03-16 DIAGNOSIS — E785 Hyperlipidemia, unspecified: Secondary | ICD-10-CM | POA: Diagnosis not present

## 2018-03-16 DIAGNOSIS — N2581 Secondary hyperparathyroidism of renal origin: Secondary | ICD-10-CM | POA: Diagnosis not present

## 2018-03-16 DIAGNOSIS — N183 Chronic kidney disease, stage 3 (moderate): Secondary | ICD-10-CM | POA: Diagnosis not present

## 2018-03-16 DIAGNOSIS — E039 Hypothyroidism, unspecified: Secondary | ICD-10-CM | POA: Diagnosis not present

## 2018-03-16 DIAGNOSIS — D631 Anemia in chronic kidney disease: Secondary | ICD-10-CM | POA: Diagnosis not present

## 2018-03-23 DIAGNOSIS — K7689 Other specified diseases of liver: Secondary | ICD-10-CM | POA: Diagnosis not present

## 2018-03-23 DIAGNOSIS — E039 Hypothyroidism, unspecified: Secondary | ICD-10-CM | POA: Diagnosis not present

## 2018-03-23 DIAGNOSIS — E559 Vitamin D deficiency, unspecified: Secondary | ICD-10-CM | POA: Diagnosis not present

## 2018-03-23 DIAGNOSIS — I10 Essential (primary) hypertension: Secondary | ICD-10-CM | POA: Diagnosis not present

## 2018-03-27 DIAGNOSIS — Z79899 Other long term (current) drug therapy: Secondary | ICD-10-CM | POA: Diagnosis not present

## 2018-03-27 DIAGNOSIS — I10 Essential (primary) hypertension: Secondary | ICD-10-CM | POA: Diagnosis not present

## 2018-03-27 DIAGNOSIS — E559 Vitamin D deficiency, unspecified: Secondary | ICD-10-CM | POA: Diagnosis not present

## 2018-03-27 DIAGNOSIS — E039 Hypothyroidism, unspecified: Secondary | ICD-10-CM | POA: Diagnosis not present

## 2018-03-27 DIAGNOSIS — M810 Age-related osteoporosis without current pathological fracture: Secondary | ICD-10-CM | POA: Diagnosis not present

## 2018-04-01 DIAGNOSIS — E039 Hypothyroidism, unspecified: Secondary | ICD-10-CM | POA: Diagnosis not present

## 2018-04-01 DIAGNOSIS — M545 Low back pain: Secondary | ICD-10-CM | POA: Diagnosis not present

## 2018-04-01 DIAGNOSIS — I10 Essential (primary) hypertension: Secondary | ICD-10-CM | POA: Diagnosis not present

## 2018-04-01 DIAGNOSIS — E559 Vitamin D deficiency, unspecified: Secondary | ICD-10-CM | POA: Diagnosis not present

## 2018-04-20 DIAGNOSIS — M79602 Pain in left arm: Secondary | ICD-10-CM | POA: Diagnosis not present

## 2018-04-20 DIAGNOSIS — M19019 Primary osteoarthritis, unspecified shoulder: Secondary | ICD-10-CM | POA: Diagnosis not present

## 2018-04-20 DIAGNOSIS — M47817 Spondylosis without myelopathy or radiculopathy, lumbosacral region: Secondary | ICD-10-CM | POA: Diagnosis not present

## 2018-04-20 DIAGNOSIS — M75100 Unspecified rotator cuff tear or rupture of unspecified shoulder, not specified as traumatic: Secondary | ICD-10-CM | POA: Diagnosis not present

## 2018-05-04 DIAGNOSIS — M47817 Spondylosis without myelopathy or radiculopathy, lumbosacral region: Secondary | ICD-10-CM | POA: Diagnosis not present

## 2018-05-05 ENCOUNTER — Ambulatory Visit (HOSPITAL_COMMUNITY)
Admission: RE | Admit: 2018-05-05 | Discharge: 2018-05-05 | Disposition: A | Discharge status: Home / Self Care | Payer: Medicare Other | Source: Ambulatory Visit | Attending: Pain Medicine | Admitting: Pain Medicine

## 2018-05-05 ENCOUNTER — Other Ambulatory Visit (HOSPITAL_COMMUNITY): Payer: Self-pay | Admitting: Pain Medicine

## 2018-05-05 DIAGNOSIS — M545 Low back pain: Principal | ICD-10-CM

## 2018-05-05 DIAGNOSIS — M5136 Other intervertebral disc degeneration, lumbar region: Secondary | ICD-10-CM | POA: Insufficient documentation

## 2018-05-05 DIAGNOSIS — G8929 Other chronic pain: Secondary | ICD-10-CM

## 2018-05-07 DIAGNOSIS — M19019 Primary osteoarthritis, unspecified shoulder: Secondary | ICD-10-CM | POA: Diagnosis not present

## 2018-06-01 DIAGNOSIS — M47817 Spondylosis without myelopathy or radiculopathy, lumbosacral region: Secondary | ICD-10-CM | POA: Diagnosis not present

## 2018-06-22 DIAGNOSIS — R1011 Right upper quadrant pain: Secondary | ICD-10-CM | POA: Diagnosis not present

## 2018-06-22 DIAGNOSIS — R1010 Upper abdominal pain, unspecified: Secondary | ICD-10-CM | POA: Diagnosis not present

## 2018-06-22 DIAGNOSIS — R112 Nausea with vomiting, unspecified: Secondary | ICD-10-CM | POA: Diagnosis not present

## 2018-06-22 DIAGNOSIS — N390 Urinary tract infection, site not specified: Secondary | ICD-10-CM | POA: Diagnosis not present

## 2018-06-22 DIAGNOSIS — R198 Other specified symptoms and signs involving the digestive system and abdomen: Secondary | ICD-10-CM | POA: Diagnosis not present

## 2018-06-22 DIAGNOSIS — R140 Abdominal distension (gaseous): Secondary | ICD-10-CM | POA: Diagnosis not present

## 2018-06-23 ENCOUNTER — Other Ambulatory Visit: Payer: Self-pay | Admitting: Internal Medicine

## 2018-06-23 DIAGNOSIS — R1010 Upper abdominal pain, unspecified: Secondary | ICD-10-CM

## 2018-06-24 ENCOUNTER — Encounter (HOSPITAL_COMMUNITY): Payer: Self-pay | Admitting: Emergency Medicine

## 2018-06-24 ENCOUNTER — Emergency Department (HOSPITAL_COMMUNITY)
Admission: EM | Admit: 2018-06-24 | Discharge: 2018-06-24 | Disposition: A | Discharge status: Home / Self Care | Payer: Medicare Other | Attending: Emergency Medicine | Admitting: Emergency Medicine

## 2018-06-24 ENCOUNTER — Emergency Department (HOSPITAL_COMMUNITY): Payer: Medicare Other

## 2018-06-24 ENCOUNTER — Other Ambulatory Visit: Payer: Self-pay

## 2018-06-24 DIAGNOSIS — I129 Hypertensive chronic kidney disease with stage 1 through stage 4 chronic kidney disease, or unspecified chronic kidney disease: Secondary | ICD-10-CM | POA: Insufficient documentation

## 2018-06-24 DIAGNOSIS — Z79899 Other long term (current) drug therapy: Secondary | ICD-10-CM | POA: Diagnosis not present

## 2018-06-24 DIAGNOSIS — R000 Tachycardia, unspecified: Secondary | ICD-10-CM | POA: Diagnosis not present

## 2018-06-24 DIAGNOSIS — N183 Chronic kidney disease, stage 3 (moderate): Secondary | ICD-10-CM | POA: Insufficient documentation

## 2018-06-24 DIAGNOSIS — K8020 Calculus of gallbladder without cholecystitis without obstruction: Secondary | ICD-10-CM

## 2018-06-24 DIAGNOSIS — R112 Nausea with vomiting, unspecified: Secondary | ICD-10-CM | POA: Insufficient documentation

## 2018-06-24 DIAGNOSIS — K828 Other specified diseases of gallbladder: Secondary | ICD-10-CM | POA: Diagnosis not present

## 2018-06-24 DIAGNOSIS — E876 Hypokalemia: Secondary | ICD-10-CM | POA: Insufficient documentation

## 2018-06-24 DIAGNOSIS — Z87891 Personal history of nicotine dependence: Secondary | ICD-10-CM | POA: Diagnosis not present

## 2018-06-24 DIAGNOSIS — R1011 Right upper quadrant pain: Secondary | ICD-10-CM | POA: Insufficient documentation

## 2018-06-24 LAB — MAGNESIUM: Magnesium: 1.8 mg/dL (ref 1.7–2.4)

## 2018-06-24 LAB — URINALYSIS, ROUTINE W REFLEX MICROSCOPIC
BILIRUBIN URINE: NEGATIVE
Glucose, UA: NEGATIVE mg/dL
KETONES UR: 20 mg/dL — AB
LEUKOCYTES UA: NEGATIVE
NITRITE: NEGATIVE
PH: 6 (ref 5.0–8.0)
Protein, ur: 30 mg/dL — AB
SPECIFIC GRAVITY, URINE: 1.012 (ref 1.005–1.030)

## 2018-06-24 LAB — COMPREHENSIVE METABOLIC PANEL
ALT: 9 U/L (ref 0–44)
AST: 15 U/L (ref 15–41)
Albumin: 3.2 g/dL — ABNORMAL LOW (ref 3.5–5.0)
Alkaline Phosphatase: 91 U/L (ref 38–126)
Anion gap: 9 (ref 5–15)
BILIRUBIN TOTAL: 0.7 mg/dL (ref 0.3–1.2)
BUN: 12 mg/dL (ref 8–23)
CHLORIDE: 104 mmol/L (ref 98–111)
CO2: 26 mmol/L (ref 22–32)
Calcium: 10.3 mg/dL (ref 8.9–10.3)
Creatinine, Ser: 1.12 mg/dL — ABNORMAL HIGH (ref 0.44–1.00)
GFR, EST AFRICAN AMERICAN: 53 mL/min — AB (ref >60)
GFR, EST NON AFRICAN AMERICAN: 46 mL/min — AB (ref >60)
Glucose, Bld: 94 mg/dL (ref 70–99)
POTASSIUM: 2.7 mmol/L — AB (ref 3.5–5.1)
Sodium: 139 mmol/L (ref 135–145)
TOTAL PROTEIN: 6.7 g/dL (ref 6.5–8.1)

## 2018-06-24 LAB — LIPASE, BLOOD: LIPASE: 25 U/L (ref 11–51)

## 2018-06-24 LAB — CBC
HEMATOCRIT: 35.5 % — AB (ref 36.0–46.0)
Hemoglobin: 11.4 g/dL — ABNORMAL LOW (ref 12.0–15.0)
MCH: 24.9 pg — ABNORMAL LOW (ref 26.0–34.0)
MCHC: 32.1 g/dL (ref 30.0–36.0)
MCV: 77.7 fL — ABNORMAL LOW (ref 78.0–100.0)
PLATELETS: 283 10*3/uL (ref 150–400)
RBC: 4.57 MIL/uL (ref 3.87–5.11)
RDW: 17.6 % — AB (ref 11.5–15.5)
WBC: 8.1 10*3/uL (ref 4.0–10.5)

## 2018-06-24 MED ORDER — POTASSIUM CHLORIDE CRYS ER 20 MEQ PO TBCR
40.0000 meq | EXTENDED_RELEASE_TABLET | Freq: Once | ORAL | Status: AC
  Administered 2018-06-24: 40 meq via ORAL
  Filled 2018-06-24: qty 2

## 2018-06-24 MED ORDER — SODIUM CHLORIDE 0.9 % IV BOLUS
1000.0000 mL | Freq: Once | INTRAVENOUS | Status: AC
  Administered 2018-06-24: 1000 mL via INTRAVENOUS

## 2018-06-24 MED ORDER — HYDROCODONE-ACETAMINOPHEN 5-325 MG PO TABS: 1.0000 | ORAL_TABLET | ORAL | 0 refills | Status: DC | PRN

## 2018-06-24 MED ORDER — ONDANSETRON 4 MG PO TBDP: 4.0000 mg | ORAL_TABLET | Freq: Three times a day (TID) | ORAL | 0 refills | Status: DC | PRN

## 2018-06-24 MED ORDER — FENTANYL CITRATE (PF) 100 MCG/2ML IJ SOLN
50.0000 ug | Freq: Once | INTRAMUSCULAR | Status: AC
  Administered 2018-06-24: 50 ug via INTRAVENOUS
  Filled 2018-06-24: qty 2

## 2018-06-24 MED ORDER — POTASSIUM CHLORIDE CRYS ER 20 MEQ PO TBCR: 20.0000 meq | EXTENDED_RELEASE_TABLET | Freq: Two times a day (BID) | ORAL | 0 refills | Status: AC

## 2018-06-24 MED ORDER — MAGNESIUM SULFATE 2 GM/50ML IV SOLN
2.0000 g | Freq: Once | INTRAVENOUS | Status: AC
  Administered 2018-06-24: 2 g via INTRAVENOUS
  Filled 2018-06-24: qty 50

## 2018-06-24 MED ORDER — ONDANSETRON HCL 4 MG/2ML IJ SOLN
4.0000 mg | Freq: Once | INTRAMUSCULAR | Status: AC
  Administered 2018-06-24: 4 mg via INTRAVENOUS
  Filled 2018-06-24: qty 2

## 2018-06-24 NOTE — ED Provider Notes (Signed)
[facility] Provider Note   CSN: [id number] Arrival date & time: 06/24/18  0803     History   Chief Complaint Chief Complaint  Patient presents with  . Abdominal Pain    HPI [name] is a 77 y.o. female.  Pt presents to the ED today with abdominal pain, n/v. The pt said she's had pain for 2 days.  She called her pcp who ordered an us, but that is not until Friday.  Pain is worsening.  She denies f/c.  She denies f/c.     Past Medical History:  Diagnosis Date  . Allergic rhinitis   . Anxiety   . Back pain   . Bronchiectasis   . Dyslipidemia   . Dysuria 10/14/2013  . Fatigue 08/02/2013  . GERD (gastroesophageal reflux disease)   . History of Graves' disease   . Hx of colonic polyp   . Hypercalcemia   . Hyperlipidemia   . Hypertension   . Hypokalemia 11/15/2013  . 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 Murmur, heart   . Osteoarthritis, knee   . Renal insufficiency   . Thoracic aortic aneurysm (HCC)   . Thrombocytopenia, unspecified (HCC) 08/02/2013  . Thrush 10/14/2013  . Thrush, oral 09/02/2013  . Thyroid disease   . UTI (lower urinary tract infection) 10/14/2013    Patient Active Problem List   Diagnosis Date Noted  . Chronic constipation 11/01/2016  . CKD (chronic kidney disease) stage 3, GFR 30-59 ml/min (HCC) 08/31/2016  . Chronic pain syndrome 08/31/2016  . Neuropathy 08/31/2016  . Arthritis 05/15/2016  . Chest pain 05/14/2016  . Hypokalemia 11/15/2013  . Dysuria 10/14/2013  . Anemia of chronic disease 09/13/2013  . Family history of malignant neoplasm of gastrointestinal tract 09/13/2013  . Idiopathic thrombocytopenic purpura (HCC) 08/11/2013  . Fatigue 08/02/2013  . COLONIC POLYPS, HX OF 06/19/2010  . COUGH 04/06/2010  . ANEMIA 04/02/2010  . GASTROENTERITIS, HX OF 12/18/2008  . PULMONARY NODULE, LEFT LOWER LOBE 09/02/2007  . GASTROESOPHAGEAL REFLUX DISEASE 09/02/2007  . IRRITABLE BOWEL  SYNDROME 09/02/2007  . ANXIETY DISORDER, GENERALIZED 07/14/2007  . Hypothyroidism 07/09/2007  . DYSLIPIDEMIA 07/09/2007  . HYPERCALCEMIA 07/09/2007  . Essential hypertension 07/09/2007  . ALLERGIC RHINITIS 07/09/2007  . BRONCHIECTASIS 07/09/2007  . RENAL INSUFFICIENCY 07/09/2007  . OSTEOARTHRITIS, KNEES, BILATERAL 07/09/2007  . Backache 07/09/2007  . GRAVE'S DISEASE, HX OF 07/09/2007  . HEART MURMUR, HX OF 07/09/2007    Past Surgical History:  Procedure Laterality Date  . ABDOMINAL HYSTERECTOMY     b/c fibroids  . APPENDECTOMY    . KNEE ARTHROPLASTY Right 10/2007  . TOTAL KNEE ARTHROPLASTY Bilateral      OB History   None      Home Medications    Prior to Admission medications   Medication Sig Start Date End Date Taking? Authorizing Provider  acetaminophen (TYLENOL) 500 MG tablet Take 500 mg by mouth every 6 (six) hours as needed.    [provider]  amLODipine (NORVASC) 5 MG tablet Take 5 mg by mouth 2 (two) times daily. Reported on 05/14/2016 01/23/16   [provider]  BUTRANS 5 MCG/HR PTWK patch Place 1 patch (5 mcg total) onto the skin once a week. Thursday Patient not taking: Reported on 01/21/2018 07/19/16   Kirt Boysarter, Monica, DO  citalopram (CELEXA) 20 MG tablet Take 20 mg by mouth daily.    [provider]  doxycycline (VIBRA-TABS) 100 MG tablet Take 1 tablet (100 mg total) by  mouth 2 (two) times daily. 01/21/18   Lupita Leash, MD  furosemide (LASIX) 20 MG tablet  10/18/16   [provider]  HYDROcodone-acetaminophen (NORCO/VICODIN) 5-325 MG tablet Take 1 tablet by mouth every 4 (four) hours as needed. 06/24/18   Jacalyn Lefevre, MD  levothyroxine (SYNTHROID, LEVOTHROID) 100 MCG tablet Take 100 mcg by mouth daily before breakfast.     [provider]  loratadine (CLARITIN) 10 MG tablet TAKE 1 TABLET (10 MG TOTAL) BY MOUTH DAILY. 02/13/17   Leslye Peer, MD  losartan (COZAAR) 100 MG tablet Take 100 mg by mouth daily.     [provider]  LYRICA 150 MG capsule Take 150 mg by mouth 2 (two) times daily. 12/27/17   [provider]  ondansetron (ZOFRAN ODT) 4 MG disintegrating tablet Take 1 tablet (4 mg total) by mouth every 8 (eight) hours as needed. 06/24/18   Jacalyn Lefevre, MD  ondansetron (ZOFRAN) 8 MG tablet Take 1 tablet (8 mg total) by mouth every 8 (eight) hours as needed for nausea or vomiting. 06/20/16   Mancel Bale, MD  pantoprazole (PROTONIX) 40 MG tablet TAKE 1 TABLET BY MOUTH DAILY 05/27/17   Leslye Peer, MD  potassium chloride SA (K-DUR,KLOR-CON) 20 MEQ tablet Take 1 tablet (20 mEq total) by mouth 2 (two) times daily. 06/24/18   Jacalyn Lefevre, MD  Respiratory Therapy Supplies (FLUTTER) DEVI Use as directed 01/21/18   Lupita Leash, MD    Family History Family History  Problem Relation Age of Onset  . Kidney cancer Brother 24  . Prostate cancer Brother   . Colon cancer Brother   . Breast cancer Maternal Aunt   . Liver cancer Maternal Uncle   . Colon polyps Brother   . Heart disease Mother   . Kidney disease Mother        on dialysis  . Irritable bowel syndrome Maternal Aunt     Social History Social History   Tobacco Use  . Smoking status: Former Smoker    Packs/day: 1.00    Years: 10.00    Pack years: 10.00    Types: Cigarettes    Last attempt to quit: 11/11/1976    Years since quitting: 41.6  . Smokeless tobacco: Never Used  Substance Use Topics  . Alcohol use: No  . Drug use: No     Allergies   Cauliflower [brassica oleracea italica]; Codeine; Fish allergy; Lactose intolerance (gi); Morphine and related; Oxycodone; Penicillins; Sulfonamide derivatives; Tramadol hcl; and Triamcinolone   Review of Systems Review of Systems  Gastrointestinal: Positive for abdominal pain, nausea and vomiting.  All other systems reviewed and are negative.    Physical Exam Updated Vital Signs BP (!) 143/78   Pulse 97   Temp 97.8 F (36.6 C) (Oral)   Resp 14    Ht 5' 5.5" (1.664 m)   Wt 96.6 kg   SpO2 95%   BMI 34.91 kg/m   Physical Exam  Constitutional: She is oriented to person, place, and time. She appears well-developed and well-nourished.  HENT:  Head: Normocephalic and atraumatic.  Mouth/Throat: Oropharynx is clear and moist.  Eyes: Pupils are equal, round, and reactive to light. EOM are normal.  Cardiovascular: Normal rate, regular rhythm, normal heart sounds and intact distal pulses.  Pulmonary/Chest: Effort normal and breath sounds normal.  Abdominal: Normal appearance and bowel sounds are normal. There is tenderness in the right upper quadrant and epigastric area.  Neurological: She is alert and oriented to person,  place, and time.  Skin: Skin is warm. Capillary refill takes less than 2 seconds.  Psychiatric: She has a normal mood and affect. Her behavior is normal.  Nursing note and vitals reviewed.    ED Treatments / Results  Labs (all labs ordered are listed, but only abnormal results are displayed) Labs Reviewed  COMPREHENSIVE METABOLIC PANEL - Abnormal; Notable for the following components:      Result Value   Potassium 2.7 (*)    Creatinine, Ser 1.12 (*)    Albumin 3.2 (*)    GFR calc non Af Amer 46 (*)    GFR calc Af Amer 53 (*)    All other components within normal limits  CBC - Abnormal; Notable for the following components:   Hemoglobin 11.4 (*)    HCT 35.5 (*)    MCV 77.7 (*)    MCH 24.9 (*)    RDW 17.6 (*)    All other components within normal limits  URINALYSIS, ROUTINE W REFLEX MICROSCOPIC - Abnormal; Notable for the following components:   Hgb urine dipstick SMALL (*)    Ketones, ur 20 (*)    Protein, ur 30 (*)    Bacteria, UA RARE (*)    All other components within normal limits  LIPASE, BLOOD  MAGNESIUM    EKG EKG Interpretation  Date/Time:  Wednesday June 24 2018 08:14:56 EDT Ventricular Rate:  109 PR Interval:  146 QRS Duration: 72 QT Interval:  322 QTC Calculation: 433 R  Axis:   21 Text Interpretation:  Sinus tachycardia ST & T wave abnormality, consider inferior ischemia Abnormal ECG Since last tracing rate faster Confirmed by Jacalyn LefevreHaviland, Niah Heinle [phone]([id number]) on 06/24/2018 8:21:55 AM   Radiology [place] Abdomen Limited Ruq  Result Date: 06/24/2018 CLINICAL DATA:  Right upper quadrant pain EXAM: ULTRASOUND ABDOMEN LIMITED RIGHT UPPER QUADRANT COMPARISON:  CT abdomen and pelvis Apr 09, 2017 FINDINGS: Gallbladder: Within the gallbladder, there are multiple echogenic foci which move and shadow consistent with cholelithiasis. Sludge is also noted in the gallbladder. The largest gallstone measures 3 mm in length. Gallbladder wall is slightly thickened along its anterior aspect. No edema or pericholecystic fluid is noted in the gallbladder wall. No sonographic Murphy sign noted by sonographer. Common bile duct: Diameter: 3 mm. No intrahepatic or extrahepatic biliary duct dilatation. Liver: No focal lesion identified. A cyst seen in the right lobe of the liver anteriorly on prior CT is not currently visualized by ultrasound. Within normal limits in parenchymal echogenicity. Portal vein is patent on color Doppler imaging with normal direction of blood flow towards the liver. IMPRESSION: 1. Cholelithiasis and sludge within the gallbladder. Slight thickening of a portion of the anterior aspect of the gallbladder wall. No pericholecystic fluid. A degree of early cholecystitis may be present given these findings. In this regard, it may be prudent to correlate with nuclear medicine hepatobiliary imaging study to assess for cystic duct patency. 2. Study otherwise unremarkable. Note that a previously noted cyst in the anterior aspect of the right lobe of the liver seen on CT is not appreciable on this current sonographic examination. Electronically Signed   By: Bretta BangWilliam  Woodruff III M.D.   On: 06/24/2018 09:56    Procedures Procedures (including critical care time)  Medications Ordered in  ED Medications  magnesium sulfate IVPB 2 g 50 mL (2 g Intravenous New Bag/Given 06/24/18 1019)  ondansetron (ZOFRAN) injection 4 mg (4 mg Intravenous Given 06/24/18 0911)  fentaNYL (SUBLIMAZE) injection 50 mcg (50 mcg Intravenous Given  06/24/18 0911)  sodium chloride 0.9 % bolus 1,000 mL (0 mLs Intravenous Stopped 06/24/18 0956)  potassium chloride SA (K-DUR,KLOR-CON) CR tablet 40 mEq (40 mEq Oral Given 06/24/18 1009)     Initial Impression / Assessment and Plan / ED Course  I have reviewed the triage vital signs and the nursing notes.  Pertinent labs & imaging results that were available during my care of the patient were reviewed by me and considered in my medical decision making (see chart for details).    Pt is feeling much better.  She is tolerating po fluids and crackers.  She is instructed to modify her diet (avoid fatty and greasy foods).  Return if worse.  F/u with pcp and with surgery.  Final Clinical Impressions(s) / ED Diagnoses   Final diagnoses:  RUQ pain  Hypokalemia  Calculus of gallbladder without cholecystitis without obstruction    ED Discharge Orders         Ordered    potassium chloride SA (K-DUR,KLOR-CON) 20 MEQ tablet  2 times daily     06/24/18 1101    HYDROcodone-acetaminophen (NORCO/VICODIN) 5-325 MG tablet  Every 4 hours PRN     06/24/18 1101    ondansetron (ZOFRAN ODT) 4 MG disintegrating tablet  Every 8 hours PRN     06/24/18 1101           Jacalyn Lefevre, MD 06/24/18 1102

## 2018-06-24 NOTE — ED Notes (Signed)
Patient transported to Ultrasound 

## 2018-06-24 NOTE — ED Notes (Signed)
Date and time results received: 06/24/18    Test: K Critical Value: 2.7  Name of Provider Notified: Haviland  Orders Received? Or Actions Taken?:

## 2018-06-24 NOTE — ED Triage Notes (Signed)
Pt to ER for evaluation of epigastric/RUQ/LUQ abdominal pain onset 2 days ago with small amounts of diarrhea and vomiting, reports abdominal distention. States was seen by PCP and told they were going to schedule and US to evaluate her gallbladder but that if the pain continued she should report here for workup. NAD. HR 109, other vitals stable. Reports hx of appendectomy and hysterectomy.

## 2018-06-26 ENCOUNTER — Other Ambulatory Visit: Payer: Medicare Other

## 2018-07-06 DIAGNOSIS — M19019 Primary osteoarthritis, unspecified shoulder: Secondary | ICD-10-CM | POA: Diagnosis not present

## 2018-07-06 DIAGNOSIS — M47817 Spondylosis without myelopathy or radiculopathy, lumbosacral region: Secondary | ICD-10-CM | POA: Diagnosis not present

## 2018-07-06 DIAGNOSIS — M75100 Unspecified rotator cuff tear or rupture of unspecified shoulder, not specified as traumatic: Secondary | ICD-10-CM | POA: Diagnosis not present

## 2018-07-06 DIAGNOSIS — B0229 Other postherpetic nervous system involvement: Secondary | ICD-10-CM | POA: Diagnosis not present

## 2018-08-11 DIAGNOSIS — E039 Hypothyroidism, unspecified: Secondary | ICD-10-CM | POA: Diagnosis not present

## 2018-08-11 DIAGNOSIS — R1011 Right upper quadrant pain: Secondary | ICD-10-CM | POA: Diagnosis not present

## 2018-08-11 DIAGNOSIS — Z23 Encounter for immunization: Secondary | ICD-10-CM | POA: Diagnosis not present

## 2018-08-11 DIAGNOSIS — I10 Essential (primary) hypertension: Secondary | ICD-10-CM | POA: Diagnosis not present

## 2018-08-11 DIAGNOSIS — R7309 Other abnormal glucose: Secondary | ICD-10-CM | POA: Diagnosis not present

## 2018-08-11 DIAGNOSIS — Z0000 Encounter for general adult medical examination without abnormal findings: Secondary | ICD-10-CM | POA: Diagnosis not present

## 2018-08-11 DIAGNOSIS — K7689 Other specified diseases of liver: Secondary | ICD-10-CM | POA: Diagnosis not present

## 2018-08-11 DIAGNOSIS — D509 Iron deficiency anemia, unspecified: Secondary | ICD-10-CM | POA: Diagnosis not present

## 2018-08-11 DIAGNOSIS — E559 Vitamin D deficiency, unspecified: Secondary | ICD-10-CM | POA: Diagnosis not present

## 2018-08-18 ENCOUNTER — Ambulatory Visit: Payer: Self-pay | Admitting: Surgery

## 2018-08-18 DIAGNOSIS — K8010 Calculus of gallbladder with chronic cholecystitis without obstruction: Secondary | ICD-10-CM | POA: Diagnosis not present

## 2018-08-18 NOTE — H&P (Signed)
[name] Documented: 08/18/2018 8:53 AM Location: [facility] Patient #: [id number] DOB: 11-Jan-1941 Single / Language: [place] / Race: Black or African American Female  History of Present Illness [name] MD; 08/18/2018 9:38 AM) The patient is a 77 year old female who presents for evaluation of gall stones. Note for "Gall stones": ` ` ` Patient sent for surgical consultation at the request of [name], MD, [facility]  Chief Complaint: Upper abdominal pain and gallstones. ` ` The patient is a pleasant woman comes in today with her son. She had episode of severe upper abdominal pain with nausea vomiting and bloating after having some fried chicken. Went to the ER in August. Most of work up [employer] except for gallstones noted. Rumination to follow-up with primary care physician and consider surgery referral. Patient do not hear anything so she went to her primary care office. Mentioned the attack. She actually recalls a similar episode a few years before. Some intermittent discomfort despite trying to eat better. Because of gallstones, surgical referral requested. Patient denies any heartburn or reflux. 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 That seems to be under much better control now. Not on any immunosuppressive. She does get short of breath walking a couple blocks with a cane but has not had any dysrhythmias or coronary events that she recalls. She has seen her cardiologist, Dr. Nanetta Batty, in the past. Patient usually moves her bowels most days. Had a perforated appendix that required open surgery when she was 77 years old. She's had an abdominal hysterectomy. No other abdominal surgeries.   No personal nor family history of GI/colon cancer, inflammatory bowel disease, irritable bowel syndrome, allergy such as Celiac Sprue, dietary/dairy problems, colitis, ulcers nor gastritis. No  recent sick contacts/gastroenteritis. No travel outside the country. No changes in diet. No dysphagia to solids or liquids. No significant heartburn or reflux. No hematochezia, hematemesis, coffee ground emesis. No evidence of prior gastric/peptic ulceration.  (Review of systems as stated in this history (HPI) or in the review of systems. Otherwise all other 12 point ROS are negative) ` ` `   Past Surgical History Ethlyn Gallery, CMA; 08/18/2018 8:54 AM) Appendectomy  Diagnostic Studies History [name], CMA; 08/18/2018 8:54 AM) Mammogram >3 years ago  Allergies [name], CMA; 08/18/2018 8:57 AM) Codeine/Codeine Derivatives Morphine Derivatives Penicillins Sulfa Drugs Lactose Fish OxyCODONE HCl (Abuse Deter) *ANALGESICS - OPIOID* Nausea, Vomiting. traMADol HCl *ANALGESICS - OPIOID* Triamcinolone *CHEMICALS* Swelling.  Medication History ([name], CMA; 08/18/2018 9:00 AM) amLODIPine Besylate (10MG  Tablet, Oral) Active. Citalopram Hydrobromide (20MG  Tablet, Oral) Active. HYDROcodone-Acetaminophen (5-325MG  Tablet, Oral) Active. Levothyroxine Sodium ( Tablet, Oral) Active. Loratadine (10MG  Tablet, Oral) Active. Losartan Potassium (100MG  Tablet, Oral) Active. Lyrica (150MG  Capsule, Oral) Active. Ondansetron (4MG  Tablet Disint, Oral) Active. Pantoprazole Sodium (40MG  Tablet DR, Oral) Active. Klor-Con M20 ( Tablet ER, Oral) Active. Furosemide (20MG  Tablet, Oral) Active. Lyrica (100MG  Capsule, Oral) Active. Tylenol PM Extra Strength (500-25MG  Tablet, Oral) Active. Medications Reconciled  Social History Ethlyn Gallery, CMA; 08/18/2018 8:54 AM) Alcohol use Remotely quit alcohol use. No drug use  Pregnancy / Birth History Ethlyn Gallery, CMA; 08/18/2018 8:54 AM) Age at menarche 9 years. Age of menopause <44 Gravida 1 Irregular periods Maternal age 29-35 Para 1  Other Problems Ethlyn Gallery, CMA;  08/18/2018 8:54 AM) Anxiety Disorder Arthritis Back Pain Bladder Problems Gastroesophageal Reflux Disease Heart murmur Hemorrhoids Hypercholesterolemia     Review of Systems [name] CMA; 08/18/2018  8:54 AM) General Present- Weight Loss. Not Present- Appetite Loss, Chills, Fatigue, Fever, Night Sweats and Weight Gain. Skin Not Present- Change in Wart/Mole, Dryness, Hives, Jaundice, New Lesions, Non-Healing Wounds, Rash and Ulcer. HEENT Present- Seasonal Allergies and Wears glasses/contact lenses. Not Present- Earache, Hearing Loss, Hoarseness, Nose Bleed, Oral Ulcers, Ringing in the Ears, Sinus Pain, Sore Throat, Visual Disturbances and Yellow Eyes. Respiratory Present- Snoring. Not Present- Bloody sputum, Chronic Cough, Difficulty Breathing and Wheezing. Cardiovascular Present- Swelling of Extremities. Not Present- Chest Pain, Difficulty Breathing Lying Down, Leg Cramps, Palpitations, Rapid Heart Rate and Shortness of Breath. Gastrointestinal Present- Constipation. Not Present- Abdominal Pain, Bloating, Bloody Stool, Change in Bowel Habits, Chronic diarrhea, Difficulty Swallowing, Excessive gas, Gets full quickly at meals, Hemorrhoids, Indigestion, Nausea, Rectal Pain and Vomiting. Musculoskeletal Present- Muscle Pain. Not Present- Back Pain, Joint Pain, Joint Stiffness, Muscle Weakness and Swelling of Extremities. Neurological Present- Trouble walking. Not Present- Decreased Memory, Fainting, Headaches, Numbness, Seizures, Tingling, Tremor and Weakness.  Vitals ([name] CMA; 08/18/2018 8:55 AM) 08/18/2018 8:54 AM Weight: 216 lb Height: 66in Body Surface Area: 2.07 m Body Mass Index: 34.86 kg/m  Pulse: 68 (Regular)  BP: 142/70 (Sitting, Left Arm, Standard)      Physical Exam [name] MD; 08/18/2018 9:36 AM)  General Mental Status-Alert. General Appearance-Not in acute distress, Not Sickly. Orientation-Oriented X3. Hydration-Well  hydrated. Voice-Normal.  Integumentary Global Assessment Upon inspection and palpation of skin surfaces of the - Axillae: non-tender, no inflammation or ulceration, no drainage. and Distribution of scalp and body hair is normal. General Characteristics Temperature - normal warmth is noted.  Head and Neck Head-normocephalic, atraumatic with no lesions or palpable masses. Face Global Assessment - atraumatic, no absence of expression. Neck Global Assessment - no abnormal movements, no bruit auscultated on the right, no bruit auscultated on the left, no decreased range of motion, non-tender. Trachea-midline. Thyroid Gland Characteristics - non-tender.  Eye Eyeball - Left-Extraocular movements intact, No Nystagmus. Eyeball - Right-Extraocular movements intact, No Nystagmus. Cornea - Left-No Hazy. Cornea - Right-No Hazy. Sclera/Conjunctiva - Left-No scleral icterus, No Discharge. Sclera/Conjunctiva - Right-No scleral icterus, No Discharge. Pupil - Left-Direct reaction to light normal. Pupil - Right-Direct reaction to light normal. Note: Wears glasses. Vision corrected  ENMT Ears Pinna - Left - no drainage observed, no generalized tenderness observed. Right - no drainage observed, no generalized tenderness observed. Nose and Sinuses External Inspection of the Nose - no destructive lesion observed. Inspection of the nares - Left - quiet respiration. Right - quiet respiration. Mouth and Throat Lips - Upper Lip - no fissures observed, no pallor noted. Lower Lip - no fissures observed, no pallor noted. Nasopharynx - no discharge present. Oral Cavity/Oropharynx - Tongue - no dryness observed. Oral Mucosa - no cyanosis observed. Hypopharynx - no evidence of airway distress observed.  Chest and Lung Exam Inspection Movements - Normal and Symmetrical. Accessory muscles - No use of accessory muscles in breathing. Palpation Palpation of the chest reveals -  Non-tender. Auscultation Breath sounds - Normal and Clear.  Cardiovascular Auscultation Rhythm - Regular. Murmurs & Other Heart Sounds - Auscultation of the heart reveals - No Murmurs and No Systolic Clicks.  Abdomen Inspection Inspection of the abdomen reveals - No Visible peristalsis and No Abnormal pulsations. Umbilicus - No Bleeding, No Urine drainage. Palpation/Percussion Palpation and Percussion of the abdomen reveal - Soft, Non Tender, No Rebound tenderness, No Rigidity (guarding) and No Cutaneous hyperesthesia. Note: Abdomen obese but soft. Mild epigastric and right upper quadrant discomfort.  Not severely distended. No distasis recti. No umbilical or other anterior abdominal wall hernias  Female Genitourinary Sexual Maturity Tanner 5 - Adult hair pattern. Note: No vaginal bleeding nor discharge  Peripheral Vascular Upper Extremity Inspection - Left - No Cyanotic nailbeds, Not Ischemic. Right - No Cyanotic nailbeds, Not Ischemic.  Neurologic Neurologic evaluation reveals -normal attention span and ability to concentrate, able to name objects and repeat phrases. Appropriate fund of knowledge , normal sensation and normal coordination. Mental Status Affect - not angry, not paranoid. Cranial Nerves-Normal Bilaterally. Gait-Normal.  Neuropsychiatric Mental status exam performed with findings of-able to articulate well with normal speech/language, rate, volume and coherence, thought content normal with ability to perform basic computations and apply abstract reasoning and no evidence of hallucinations, delusions, obsessions or homicidal/suicidal ideation.  Musculoskeletal Global Assessment Spine, Ribs and Pelvis - no instability, subluxation or laxity. Right Upper Extremity - no instability, subluxation or laxity.  Lymphatic Head & Neck  General Head & Neck Lymphatics: Bilateral - Description - No Localized lymphadenopathy. Axillary  General Axillary Region:  Bilateral - Description - No Localized lymphadenopathy. Femoral & Inguinal  Generalized Femoral & Inguinal Lymphatics: Left - Description - No Localized lymphadenopathy. Right - Description - No Localized lymphadenopathy.    Assessment & Plan [name] MD; 08/18/2018 9:36 AM)  CHRONIC CHOLECYSTITIS WITH CALCULUS (K80.10) Impression: Rather classic story biliary colic with gallstones. The rest of the differential diagnosis seemed unlikely.  I think she would benefit from cholecystectomy. Reasonable start out single site since she has not had any upper abdominal surgery.  Because of her shortness of breath and limited activity level with a cane, I think it would be a good idea to reach out her cardiologist to make sure she safe for surgery. Had underwhelming Myoview 5 years ago but has been a while. I believe its Dr. Allyson Sabal.  Current Plans You are being scheduled for surgery- Our schedulers will call you.  You should hear from our office's scheduling department within 5 working days about the location, date, and time of surgery. We try to make accommodations for patient's preferences in scheduling surgery, but sometimes the OR schedule or the surgeon's schedule prevents [place] from making those accommodations.  If you have not heard from our office [phone]) in 5 working days, call the office and ask for your surgeon's nurse.  If you have other questions about your diagnosis, plan, or surgery, call the office and ask for your surgeon's nurse.  Written instructions provided I recommended obtaining preoperative cardiac clearance. I am concerned about the health of the patient and the ability to tolerate the operation. Therefore, we will request clearance by cardiology to better assess operative risk & see if a reevaluation, further workup, etc is needed. Also recommendations on how medications such as for anticoagulation and blood pressure should be managed/held/restarted after  surgery. Pt Education - Pamphlet Given - Laparoscopic Gallbladder Surgery: discussed with patient and provided information. The anatomy & physiology of hepatobiliary & pancreatic function was discussed. The pathophysiology of gallbladder dysfunction was discussed. Natural history risks without surgery was discussed. I feel the risks of no intervention will lead to serious problems that outweigh the operative risks; therefore, I recommended cholecystectomy to remove the pathology. I explained laparoscopic techniques with possible need for an open approach. Probable cholangiogram to evaluate the bilary tract was explained as well.  Risks such as bleeding, infection, abscess, leak, injury to other organs, need for further treatment, heart attack, death, and other risks were  discussed. I noted a good likelihood this will help address the problem. Possibility that this will not correct all abdominal symptoms was explained. Goals of post-operative recovery were discussed as well. We will work to minimize complications. An educational handout further explaining the pathology and treatment options was given as well. Questions were answered. The patient expresses understanding & wishes to proceed with surgery.  Pt Education - [facility] Laparosopic Post Op HCI (Avontae Burkhead) Pt Education - [facility] Good Bowel Health (Jasmine Maceachern) Pt Education - Laparoscopic Cholecystectomy: gallbladder  [name], MD, FACS, MASCRS Gastrointestinal and Minimally Invasive Surgery    [address], [address] [place], [place] [address] [phone] Main / Paging [phone] Fax

## 2018-09-07 ENCOUNTER — Telehealth: Payer: Self-pay

## 2018-09-07 NOTE — Telephone Encounter (Signed)
   [facility] Pre-operative Risk Assessment    Request for surgical clearance:  1. What type of surgery is being performed? Cholecystectomy    2. When is this surgery scheduled? TBD   3. What type of clearance is required (medical clearance vs. Pharmacy clearance to hold med vs. Both)? Medical clearance   4. Are there any medications that need to be held prior to surgery and how long?None requested    5. Practice name and name of physician performing surgery? Dr. Michael Boston at [facility]   6. What is your office phone number  [phone]    7.   What is your office fax number   [phone]  8.   Anesthesia type (None, local, MAC, general) ? Not specified   Per note received from Dr. Johney Maine, Pt with SOB and limited activity level.  Unsure who Pt cardiologist is, note states Dr. Gwenlyn Found.   2012 stress test ordered by Dr. Gwenlyn Found.  Outreach made to all Pt phone numbers listed with no answer and unable to leave any messages.    [name] 09/07/2018, 10:34 AM  _________________________________________________________________   (provider comments below)

## 2018-09-07 NOTE — Telephone Encounter (Signed)
SPOKE WITH PT WHO  HAS BEEN  SCHEDULE WITH  DR BERRY FOR SURGICAL CLEARANCE.

## 2018-09-07 NOTE — Telephone Encounter (Signed)
   Primary Cardiologist: Will need new provider appointment   Chart reviewed as part of pre-operative protocol coverage. Because of Yavonne Kiss past medical history and time since last visit, he/she will require a follow-up visit in order to better assess preoperative cardiovascular risk.  Pre-op covering staff: - Please schedule appointment and call patient to inform them. - Please contact requesting surgeon's office via preferred method (i.e, phone, fax) to inform them of need for appointment prior to surgery.  [place], [place]  09/07/2018, 3:16 PM

## 2018-09-08 ENCOUNTER — Encounter: Payer: Self-pay | Admitting: Cardiovascular Disease

## 2018-09-08 ENCOUNTER — Ambulatory Visit (INDEPENDENT_AMBULATORY_CARE_PROVIDER_SITE_OTHER): Payer: Medicare Other | Admitting: Cardiovascular Disease

## 2018-09-08 VITALS — BP 112/64 | HR 63 | Ht 65.0 in | Wt 210.0 lb

## 2018-09-08 DIAGNOSIS — I10 Essential (primary) hypertension: Secondary | ICD-10-CM | POA: Diagnosis not present

## 2018-09-08 DIAGNOSIS — Z01818 Encounter for other preprocedural examination: Secondary | ICD-10-CM | POA: Diagnosis not present

## 2018-09-08 NOTE — Assessment & Plan Note (Signed)
This posture was referred for preoperative clearance for elective cholecystectomy by Dr. gross.  She has minimal cardiac risk factors.  She is never had a heart attack or stroke.  She denies chest pain or shortness of breath.  She did have a negative Myoview back in 2012 and a 2D echo 2007 that showed normal LV function with trace AI and MR.  She is cleared at low risk without the need for functional testing.

## 2018-09-08 NOTE — Assessment & Plan Note (Signed)
History of essential hypertension blood pressure measured today 112/64.  She is on amlodipine and losartan.

## 2018-09-08 NOTE — Patient Instructions (Signed)
Medication Instructions:  Your physician recommends that you continue on your current medications as directed. Please refer to the Current Medication list given to you today.  If you need a refill on your cardiac medications before your next appointment, please call your pharmacy.   Lab work: NONE If you have labs (blood work) drawn today and your tests are completely normal, you will receive your results only by: . MyChart Message (if you have MyChart) OR . A paper copy in the mail If you have any lab test that is abnormal or we need to change your treatment, we will call you to review the results.  Testing/Procedures: NONE  Follow-Up: At [facility], you and your health needs are our priority.  As part of our continuing mission to provide you with exceptional heart care, we have created designated Provider Care Teams.  These Care Teams include your primary Cardiologist (physician) and Advanced Practice Providers (APPs -  Physician Assistants and Nurse Practitioners) who all work together to provide you with the care you need, when you need it.  Follow up with DR. BERRY as needed.  Any Other Special Instructions Will Be Listed Below (If Applicable).    

## 2018-09-08 NOTE — Assessment & Plan Note (Signed)
History of dyslipidemia not on statin therapy lipid profile performed 03/23/2018 revealing total cholesterol 220, LDL 151 and HDL 50.

## 2018-09-08 NOTE — Progress Notes (Signed)
09/08/2018 [name]   1940-11-13  [id number]  Primary Physician [name], MD Primary Cardiologist: Runell Gess MD Nicholes Calamity, [place]  HPI:  [name] is a 77 y.o. moderately overweight single African-American female mother of one son who was referred by Dr. Selena Batten, her PCP, for preoperative clearance before elective cholecystectomy by Dr. Michaell Cowing.  Her only risk factor for ischemic heart disease is treated hypertension.  She does have untreated hyperlipidemia.  She is never had a heart attack or stroke and denies chest pain or shortness of breath.  There is no family history for heart disease.  She did have a negative Myoview stress test 06/07/2011 and a 2D echo performed in 2007 showed normal LV systolic function with trace AI, MR and TR.   Current Meds  Medication Sig  . acetaminophen (TYLENOL) 500 MG tablet Take 500 mg by mouth every 6 (six) hours as needed.  Marland Kitchen amLODipine (NORVASC) 5 MG tablet Take 5 mg by mouth 2 (two) times daily. Reported on 05/14/2016  . citalopram (CELEXA) 20 MG tablet Take 20 mg by mouth daily.  . furosemide (LASIX) 20 MG tablet Take 20 mg by mouth daily.   Marland Kitchen levothyroxine (SYNTHROID, LEVOTHROID) 100 MCG tablet Take 100 mcg by mouth daily before breakfast.   . loratadine (CLARITIN) 10 MG tablet TAKE 1 TABLET (10 MG TOTAL) BY MOUTH DAILY.  Marland Kitchen losartan (COZAAR) 100 MG tablet Take 100 mg by mouth daily.  . ondansetron (ZOFRAN ODT) 4 MG disintegrating tablet Take 1 tablet (4 mg total) by mouth every 8 (eight) hours as needed.  . ondansetron (ZOFRAN) 8 MG tablet Take 1 tablet (8 mg total) by mouth every 8 (eight) hours as needed for nausea or vomiting.  . pantoprazole (PROTONIX) 40 MG tablet TAKE 1 TABLET BY MOUTH DAILY  . potassium chloride SA (K-DUR,KLOR-CON) 20 MEQ tablet Take 1 tablet (20 mEq total) by mouth 2 (two) times daily.  . pregabalin (LYRICA) 150 MG capsule Take 150 mg by mouth 2 (two) times daily.  Marland Kitchen Respiratory Therapy Supplies (FLUTTER)  DEVI Use as directed     Allergies  Allergen Reactions  . Cauliflower [Brassica Oleracea Italica]     Showed up on allergy test  . Codeine Nausea And Vomiting    REACTION: Nausea/vomiting  . Fish Allergy Nausea Only  . Lactose Intolerance (Gi) Diarrhea  . Morphine And Related Nausea And Vomiting  . Oxycodone Nausea And Vomiting  . Penicillins     REACTION: headache, tremors, Nausea Has patient had a PCN reaction causing immediate rash, facial/tongue/throat swelling, SOB or lightheadedness with hypotension: No Has patient had a PCN reaction causing severe rash involving mucus membranes or skin necrosis: No Has patient had a PCN reaction that required hospitalization No Has patient had a PCN reaction occurring within the last 10 years: No If all of the above answers are "NO", then may proceed with Cephalosporin use.   . Sulfonamide Derivatives Swelling    REACTION: mouth swells  . Tramadol Hcl Other (See Comments)    REACTION: GI Intolerance  . Triamcinolone Swelling    REACTION: angio edema    Social History   Socioeconomic History  . Marital status: Single    Spouse name: Not on file  . Number of children: Not on file  . Years of education: Not on file  . Highest education level: Not on file  Occupational History  . Not on file  Social Needs  . Financial resource strain: Not  on file  . Food insecurity:    Worry: Not on file    Inability: Not on file  . Transportation needs:    Medical: Not on file    Non-medical: Not on file  Tobacco Use  . Smoking status: Former Smoker    Packs/day: 1.00    Years: 10.00    Pack years: 10.00    Types: Cigarettes    Last attempt to quit: 11/11/1976    Years since quitting: 41.8  . Smokeless tobacco: Never Used  Substance and Sexual Activity  . Alcohol use: No  . Drug use: No  . Sexual activity: Not on file  Lifestyle  . Physical activity:    Days per week: Not on file    Minutes per session: Not on file  . Stress: Not on  file  Relationships  . Social connections:    Talks on phone: Not on file    Gets together: Not on file    Attends religious service: Not on file    Active member of club or organization: Not on file    Attends meetings of clubs or organizations: Not on file    Relationship status: Not on file  . Intimate partner violence:    Fear of current or ex partner: Not on file    Emotionally abused: Not on file    Physically abused: Not on file    Forced sexual activity: Not on file  Other Topics Concern  . Not on file  Social History Narrative  . Not on file     Review of Systems: General: negative for chills, fever, night sweats or weight changes.  Cardiovascular: negative for chest pain, dyspnea on exertion, edema, orthopnea, palpitations, paroxysmal nocturnal dyspnea or shortness of breath Dermatological: negative for rash Respiratory: negative for cough or wheezing Urologic: negative for hematuria Abdominal: negative for nausea, vomiting, diarrhea, bright red blood per rectum, melena, or hematemesis Neurologic: negative for visual changes, syncope, or dizziness All other systems reviewed and are otherwise negative except as noted above.    Blood pressure 112/64, pulse 63, height 5\' 5"  (1.651 m), weight 210 lb (95.3 kg).  General appearance: alert and no distress Neck: no adenopathy, no carotid bruit, no JVD, supple, symmetrical, trachea midline and thyroid not enlarged, symmetric, no tenderness/mass/nodules Lungs: clear to auscultation bilaterally Heart: regular rate and rhythm, S1, S2 normal, no murmur, click, rub or gallop Extremities: extremities normal, atraumatic, no cyanosis or edema Pulses: 2+ and symmetric Skin: Skin color, texture, turgor normal. No rashes or lesions Neurologic: Alert and oriented X 3, normal strength and tone. Normal symmetric reflexes. Normal coordination and gait  EKG sinus rhythm at 63 with poor R wave progression.  I personally reviewed this  EKG.  ASSESSMENT AND PLAN:   DYSLIPIDEMIA History of dyslipidemia not on statin therapy lipid profile performed 03/23/2018 revealing total cholesterol 220, LDL 151 and HDL 50.  Essential hypertension History of essential hypertension blood pressure measured today 112/64.  She is on amlodipine and losartan.  Preoperative clearance This posture was referred for preoperative clearance for elective cholecystectomy by Dr. gross.  She has minimal cardiac risk factors.  She is never had a heart attack or stroke.  She denies chest pain or shortness of breath.  She did have a negative Myoview back in 2012 and a 2D echo 2007 that showed normal LV function with trace AI and MR.  She is cleared at low risk without the need for functional testing.  Runell Gess MD FACP,FACC,FAHA, [facility] 09/08/2018 11:41 AM

## 2018-10-01 DIAGNOSIS — N189 Chronic kidney disease, unspecified: Secondary | ICD-10-CM | POA: Diagnosis not present

## 2018-10-01 DIAGNOSIS — E0500 Thyrotoxicosis with diffuse goiter without thyrotoxic crisis or storm: Secondary | ICD-10-CM | POA: Diagnosis not present

## 2018-10-01 DIAGNOSIS — E785 Hyperlipidemia, unspecified: Secondary | ICD-10-CM | POA: Diagnosis not present

## 2018-10-01 DIAGNOSIS — N2581 Secondary hyperparathyroidism of renal origin: Secondary | ICD-10-CM | POA: Diagnosis not present

## 2018-10-01 DIAGNOSIS — D631 Anemia in chronic kidney disease: Secondary | ICD-10-CM | POA: Diagnosis not present

## 2018-10-01 DIAGNOSIS — N6310 Unspecified lump in the right breast, unspecified quadrant: Secondary | ICD-10-CM | POA: Diagnosis not present

## 2018-10-01 DIAGNOSIS — N183 Chronic kidney disease, stage 3 (moderate): Secondary | ICD-10-CM | POA: Diagnosis not present

## 2018-10-15 NOTE — Progress Notes (Signed)
CARDIAC CLEARANCE DR. Erlene QuanJ. BERRY 09-08-18 Epic   EKG 09-08-18 Epic   CXR 01-21-18 Epic

## 2018-10-16 ENCOUNTER — Other Ambulatory Visit: Payer: Self-pay

## 2018-10-16 ENCOUNTER — Encounter (HOSPITAL_COMMUNITY)
Admission: RE | Admit: 2018-10-16 | Discharge: 2018-10-16 | Disposition: A | Discharge status: Home / Self Care | Payer: Medicare Other | Source: Ambulatory Visit | Attending: Surgery | Admitting: Surgery

## 2018-10-16 ENCOUNTER — Encounter (HOSPITAL_COMMUNITY): Payer: Self-pay

## 2018-10-16 DIAGNOSIS — Z01812 Encounter for preprocedural laboratory examination: Secondary | ICD-10-CM | POA: Insufficient documentation

## 2018-10-16 DIAGNOSIS — K811 Chronic cholecystitis: Secondary | ICD-10-CM | POA: Diagnosis not present

## 2018-10-16 HISTORY — DX: Zoster without complications: B02.9

## 2018-10-16 LAB — BASIC METABOLIC PANEL
Anion gap: 9 (ref 5–15)
BUN: 18 mg/dL (ref 8–23)
CO2: 29 mmol/L (ref 22–32)
Calcium: 10.5 mg/dL — ABNORMAL HIGH (ref 8.9–10.3)
Chloride: 102 mmol/L (ref 98–111)
Creatinine, Ser: 1.28 mg/dL — ABNORMAL HIGH (ref 0.44–1.00)
GFR calc Af Amer: 46 mL/min — ABNORMAL LOW (ref >60)
GFR, EST NON AFRICAN AMERICAN: 40 mL/min — AB (ref >60)
GLUCOSE: 84 mg/dL (ref 70–99)
Potassium: 2.7 mmol/L — CL (ref 3.5–5.1)
Sodium: 140 mmol/L (ref 135–145)

## 2018-10-16 LAB — CBC
HCT: 33.6 % — ABNORMAL LOW (ref 36.0–46.0)
HEMOGLOBIN: 10.5 g/dL — AB (ref 12.0–15.0)
MCH: 24.1 pg — AB (ref 26.0–34.0)
MCHC: 31.3 g/dL (ref 30.0–36.0)
MCV: 77.1 fL — ABNORMAL LOW (ref 80.0–100.0)
Platelets: 280 10*3/uL (ref 150–400)
RBC: 4.36 MIL/uL (ref 3.87–5.11)
RDW: 18.8 % — ABNORMAL HIGH (ref 11.5–15.5)
WBC: 6.4 10*3/uL (ref 4.0–10.5)
nRBC: 0 % (ref 0.0–0.2)

## 2018-10-16 NOTE — Patient Instructions (Addendum)
[name]  10/16/2018   Your procedure is scheduled on: 10-22-18   Report to [facility] Main  Entrance    Report to admitting at 8:00AM    Call this number if you have problems the morning of surgery [phone]     Remember: NO SOLID FOOD AFTER MIDNIGHT THE NIGHT PRIOR TO SURGERY. NOTHING BY MOUTH EXCEPT CLEAR LIQUIDS UNTIL 3 HOURS PRIOR TO SCHEULED SURGERY. PLEASE FINISH ENSURE DRINK PER SURGEON ORDER 3 HOURS PRIOR TO SCHEDULED SURGERY TIME WHICH NEEDS TO BE COMPLETED AT ____7:00AM______.  BRUSH YOUR TEETH MORNING OF SURGERY AND RINSE YOUR MOUTH OUT, NO CHEWING GUM CANDY OR MINTS.     CLEAR LIQUID DIET   Foods Allowed                                                                     Foods Excluded  Coffee and tea, regular and decaf                             liquids that you cannot  Plain Jell-O in any flavor                                             see through such as: Fruit ices (not with fruit pulp)                                     milk, soups, orange juice  Iced Popsicles                                    All solid food Carbonated beverages, regular and diet                                    Cranberry, grape and apple juices Sports drinks like Gatorade Lightly seasoned clear broth or consume(fat free) Sugar, honey syrup  Sample Menu Breakfast                                Lunch                                     Supper Cranberry juice                    Beef broth                            Chicken broth Jell-O  Grape juice                           Apple juice Coffee or tea                        Jell-O                                      Popsicle                                                Coffee or tea                        Coffee or tea  _____________________________________________________________________       Take these medicines the morning of surgery with A SIP OF WATER:  AMLODIPINE, CELEXA, LEVOTHYROXINE, CLARITIN, PROTONIX, TYLENOL IF NEEDED                                You may not have any metal on your body including hair pins and              piercings  Do not wear jewelry, make-up, lotions, powders or perfumes, deodorant             Do not wear nail polish.  Do not shave  48 hours prior to surgery.               Do not bring valuables to the hospital. [facility] IS NOT             RESPONSIBLE   FOR VALUABLES.  Contacts, dentures or bridgework may not be worn into surgery.      Patients discharged the day of surgery will not be allowed to drive home.  Name and phone number of your driver:  Special Instructions: N/A              Please read over the following fact sheets you were given: _____________________________________________________________________             [facility] Before surgery, you can play an important role.  Because skin is not sterile, your skin needs to be as free of germs as possible.  You can reduce the number of germs on your skin by washing with CHG (chlorahexidine gluconate) soap before surgery.  CHG is an antiseptic cleaner which kills germs and bonds with the skin to continue killing germs even after washing. Please DO NOT use if you have an allergy to CHG or antibacterial soaps.  If your skin becomes reddened/irritated stop using the CHG and inform your nurse when you arrive at Short Stay. Do not shave (including legs and underarms) for at least 48 hours prior to the first CHG shower.  You may shave your face/neck. Please follow these instructions carefully:  1.  Shower with CHG Soap the night before surgery and the  morning of Surgery.  2.  If you choose to wash your hair, wash your hair first as usual with your  normal  shampoo.  3.  After you shampoo, rinse your hair and body  thoroughly to remove the  shampoo.                           4.  Use CHG as you would any other liquid soap.  You can  apply chg directly  to the skin and wash                       Gently with a scrungie or clean washcloth.  5.  Apply the CHG Soap to your body ONLY FROM THE NECK DOWN.   Do not use on face/ open                           Wound or open sores. Avoid contact with eyes, ears mouth and genitals (private parts).                       Wash face,  Genitals (private parts) with your normal soap.             6.  Wash thoroughly, paying special attention to the area where your surgery  will be performed.  7.  Thoroughly rinse your body with warm water from the neck down.  8.  DO NOT shower/wash with your normal soap after using and rinsing off  the CHG Soap.                9.  Pat yourself dry with a clean towel.            10.  Wear clean pajamas.            11.  Place clean sheets on your bed the night of your first shower and do not  sleep with pets. Day of Surgery : Do not apply any lotions/deodorants the morning of surgery.  Please wear clean clothes to the hospital/surgery center.  FAILURE TO FOLLOW THESE INSTRUCTIONS MAY RESULT IN THE CANCELLATION OF YOUR SURGERY PATIENT SIGNATURE_________________________________  NURSE SIGNATURE__________________________________  ________________________________________________________________________

## 2018-10-16 NOTE — Progress Notes (Signed)
This RN called [facility] and spoke with nurse [name]. This RN requested to speak with [name] [name] if available, or if not another provider such as a PA or NP. [name] asked this RN what the purpose of the call was. This RN stated that it was to report a critical lab for [name]. RN also provided [name] on request, [name]. [name] stated that she could see the lab results in epic and then put this RN on hold after stating "I'll let [name] [name] know you need to speak with him". This RN was on hold for approx. 5 min before [name] picked up the phone again and stated "So [name] [name] wants y'all to call the patient's primary care doctor [name] to report the potassium". RN replied to [name] explaining that the pre-op nurse is responsible for reporting the critical lab results to the SURGEON as the patient is under the care of the surgeon for this procedure. [name] replied "[name] can't even call her doctor's office; [name] [name] is [name] be upset to hear that." RN replied that there is no provider such as an MD or midlevel in the pre-op department that can put in any orders or organize care for a patient in that matter and nurse only communicates the abnormal lab results to the surgeon's office since patient is coming in for pre-op for the surgeon."  [name] replied that "y'all are always passing the buck". [name] also states that " [name]. Michaell Gould doesn't understand why anesthesia can't put in orders and take care of this". RN replied that pre-op nurse, on occasion, may consult with anesthesia for issues such as abnormal EKGs or to advise if the patient may need clearance before proceeding with surgery; but anesthesia doctors will not order any tests as the pre-op patients are NOT their patients. RN suggested that [name] [name] could order a redraw of the abnormal lab and pre-op nurse can ask patient if she will come back to pre-op to have lab redrawn and if lab still critical, may encourage patient to seek care  in the ER. [name] replied, let me ask [name] [name] what he wants to do since you all will not do anything". RN was on hold for approx. 2-3 min. Upon returning, [name] states "[name]. Michaell Gould said he will just route a note to [name] but he's not going to come to the phone to speak with you but he told me to document all this in epic." RN replied that the reason for calling was to make the surgeon aware of the critical lab, however I cannot make [name] [name] speak with me but I will document that the attempt was made.  Physiological scientistAssistant Director and charge nurse made aware of the above.

## 2018-10-16 NOTE — Progress Notes (Addendum)
CRITICAL VALUE ALERT  Critical Value:  Potassium 2.7  Date & Time Notied:  10/16/2018 ; 1318  Provider Notified: Dr Karie SodaSteven Gross   Orders Received/Actions taken:   Spoke with nurse Toniann FailWendy at [facility]. She made Dr Michaell CowingGross aware that this RN needed to speak to him about critical lab, per Toniann FailWendy RN, Dr Michaell CowingGross declines to speak with RN.

## 2018-10-21 MED ORDER — BUPIVACAINE LIPOSOME 1.3 % IJ SUSP
20.0000 mL | INTRAMUSCULAR | Status: DC
  Filled 2018-10-21: qty 20

## 2018-10-22 ENCOUNTER — Ambulatory Visit (HOSPITAL_COMMUNITY): Payer: Medicare Other | Admitting: Anesthesiology

## 2018-10-22 ENCOUNTER — Encounter (HOSPITAL_COMMUNITY): Payer: Self-pay | Admitting: Anesthesiology

## 2018-10-22 ENCOUNTER — Encounter (HOSPITAL_COMMUNITY)
Admission: RE | Disposition: A | Discharge status: Home / Self Care | Payer: Self-pay | Source: Ambulatory Visit | Attending: Surgery

## 2018-10-22 ENCOUNTER — Ambulatory Visit (HOSPITAL_COMMUNITY)
Admission: RE | Admit: 2018-10-22 | Discharge: 2018-10-23 | Disposition: A | Discharge status: Home / Self Care | Payer: Medicare Other | Source: Ambulatory Visit | Attending: Surgery | Admitting: Surgery

## 2018-10-22 ENCOUNTER — Other Ambulatory Visit: Payer: Self-pay

## 2018-10-22 ENCOUNTER — Ambulatory Visit (HOSPITAL_COMMUNITY): Payer: Medicare Other

## 2018-10-22 DIAGNOSIS — IMO0002 Reserved for concepts with insufficient information to code with codable children: Secondary | ICD-10-CM | POA: Diagnosis present

## 2018-10-22 DIAGNOSIS — Z87891 Personal history of nicotine dependence: Secondary | ICD-10-CM | POA: Insufficient documentation

## 2018-10-22 DIAGNOSIS — Z882 Allergy status to sulfonamides status: Secondary | ICD-10-CM | POA: Diagnosis not present

## 2018-10-22 DIAGNOSIS — D649 Anemia, unspecified: Secondary | ICD-10-CM | POA: Insufficient documentation

## 2018-10-22 DIAGNOSIS — M1710 Unilateral primary osteoarthritis, unspecified knee: Secondary | ICD-10-CM | POA: Diagnosis present

## 2018-10-22 DIAGNOSIS — N183 Chronic kidney disease, stage 3 unspecified: Secondary | ICD-10-CM | POA: Diagnosis present

## 2018-10-22 DIAGNOSIS — E7800 Pure hypercholesterolemia, unspecified: Secondary | ICD-10-CM | POA: Diagnosis not present

## 2018-10-22 DIAGNOSIS — D693 Immune thrombocytopenic purpura: Secondary | ICD-10-CM | POA: Insufficient documentation

## 2018-10-22 DIAGNOSIS — K5900 Constipation, unspecified: Secondary | ICD-10-CM | POA: Insufficient documentation

## 2018-10-22 DIAGNOSIS — K219 Gastro-esophageal reflux disease without esophagitis: Secondary | ICD-10-CM | POA: Insufficient documentation

## 2018-10-22 DIAGNOSIS — Z79899 Other long term (current) drug therapy: Secondary | ICD-10-CM | POA: Diagnosis not present

## 2018-10-22 DIAGNOSIS — E039 Hypothyroidism, unspecified: Secondary | ICD-10-CM | POA: Diagnosis not present

## 2018-10-22 DIAGNOSIS — R2681 Unsteadiness on feet: Secondary | ICD-10-CM | POA: Insufficient documentation

## 2018-10-22 DIAGNOSIS — Z419 Encounter for procedure for purposes other than remedying health state, unspecified: Secondary | ICD-10-CM

## 2018-10-22 DIAGNOSIS — I129 Hypertensive chronic kidney disease with stage 1 through stage 4 chronic kidney disease, or unspecified chronic kidney disease: Secondary | ICD-10-CM | POA: Diagnosis not present

## 2018-10-22 DIAGNOSIS — K581 Irritable bowel syndrome with constipation: Secondary | ICD-10-CM | POA: Insufficient documentation

## 2018-10-22 DIAGNOSIS — K8010 Calculus of gallbladder with chronic cholecystitis without obstruction: Secondary | ICD-10-CM | POA: Diagnosis not present

## 2018-10-22 DIAGNOSIS — D638 Anemia in other chronic diseases classified elsewhere: Secondary | ICD-10-CM | POA: Diagnosis present

## 2018-10-22 DIAGNOSIS — M1990 Unspecified osteoarthritis, unspecified site: Secondary | ICD-10-CM | POA: Diagnosis not present

## 2018-10-22 DIAGNOSIS — Z885 Allergy status to narcotic agent status: Secondary | ICD-10-CM | POA: Diagnosis not present

## 2018-10-22 DIAGNOSIS — E876 Hypokalemia: Secondary | ICD-10-CM | POA: Diagnosis not present

## 2018-10-22 DIAGNOSIS — Z888 Allergy status to other drugs, medicaments and biological substances status: Secondary | ICD-10-CM | POA: Insufficient documentation

## 2018-10-22 DIAGNOSIS — I712 Thoracic aortic aneurysm, without rupture: Secondary | ICD-10-CM | POA: Diagnosis not present

## 2018-10-22 DIAGNOSIS — F419 Anxiety disorder, unspecified: Secondary | ICD-10-CM | POA: Diagnosis not present

## 2018-10-22 DIAGNOSIS — G894 Chronic pain syndrome: Secondary | ICD-10-CM | POA: Diagnosis present

## 2018-10-22 DIAGNOSIS — I10 Essential (primary) hypertension: Secondary | ICD-10-CM | POA: Diagnosis not present

## 2018-10-22 HISTORY — PX: LAPAROSCOPIC CHOLECYSTECTOMY SINGLE SITE WITH INTRAOPERATIVE CHOLANGIOGRAM: SHX6538

## 2018-10-22 LAB — POTASSIUM: Potassium: 3.9 mmol/L (ref 3.5–5.1)

## 2018-10-22 SURGERY — LAPAROSCOPIC CHOLECYSTECTOMY SINGLE SITE WITH INTRAOPERATIVE CHOLANGIOGRAM
Anesthesia: General | Site: Abdomen

## 2018-10-22 MED ORDER — SUGAMMADEX SODIUM 200 MG/2ML IV SOLN
INTRAVENOUS | Status: DC | PRN
  Administered 2018-10-22: 200 mg via INTRAVENOUS

## 2018-10-22 MED ORDER — PROPOFOL 10 MG/ML IV BOLUS
INTRAVENOUS | Status: AC
  Filled 2018-10-22: qty 20

## 2018-10-22 MED ORDER — LIDOCAINE 2% (20 MG/ML) 5 ML SYRINGE
INTRAMUSCULAR | Status: DC | PRN
  Administered 2018-10-22: 100 mg via INTRAVENOUS

## 2018-10-22 MED ORDER — METOPROLOL TARTRATE 5 MG/5ML IV SOLN: 5.0000 mg | Freq: Four times a day (QID) | INTRAVENOUS | Status: DC | PRN

## 2018-10-22 MED ORDER — SODIUM CHLORIDE 0.9 % IV SOLN
250.0000 mL | INTRAVENOUS | Status: DC | PRN
  Administered 2018-10-22: 250 mL via INTRAVENOUS

## 2018-10-22 MED ORDER — MAGIC MOUTHWASH
15.0000 mL | Freq: Four times a day (QID) | ORAL | Status: DC | PRN
  Filled 2018-10-22: qty 15

## 2018-10-22 MED ORDER — LACTATED RINGERS IV BOLUS: 1000.0000 mL | Freq: Three times a day (TID) | INTRAVENOUS | Status: DC | PRN

## 2018-10-22 MED ORDER — 0.9 % SODIUM CHLORIDE (POUR BTL) OPTIME
TOPICAL | Status: DC | PRN
  Administered 2018-10-22: 1000 mL

## 2018-10-22 MED ORDER — ZOLPIDEM TARTRATE 5 MG PO TABS: 5.0000 mg | ORAL_TABLET | Freq: Every evening | ORAL | Status: DC | PRN

## 2018-10-22 MED ORDER — CLOTRIMAZOLE 1 % EX CREA
1.0000 "application " | TOPICAL_CREAM | Freq: Every day | CUTANEOUS | Status: DC | PRN
  Filled 2018-10-22: qty 15

## 2018-10-22 MED ORDER — BISACODYL 10 MG RE SUPP: 10.0000 mg | Freq: Every day | RECTAL | Status: DC | PRN

## 2018-10-22 MED ORDER — BUPIVACAINE-EPINEPHRINE (PF) 0.25% -1:200000 IJ SOLN
INTRAMUSCULAR | Status: AC
  Filled 2018-10-22: qty 60

## 2018-10-22 MED ORDER — ALBUMIN HUMAN 5 % IV SOLN
INTRAVENOUS | Status: AC
  Filled 2018-10-22: qty 250

## 2018-10-22 MED ORDER — ACETAMINOPHEN 650 MG RE SUPP: 650.0000 mg | Freq: Four times a day (QID) | RECTAL | Status: DC | PRN

## 2018-10-22 MED ORDER — FENTANYL CITRATE (PF) 100 MCG/2ML IJ SOLN
INTRAMUSCULAR | Status: AC
  Filled 2018-10-22: qty 2

## 2018-10-22 MED ORDER — PROPOFOL 10 MG/ML IV BOLUS
INTRAVENOUS | Status: DC | PRN
  Administered 2018-10-22: 160 mg via INTRAVENOUS

## 2018-10-22 MED ORDER — ONDANSETRON HCL 4 MG/2ML IJ SOLN: 4.0000 mg | Freq: Four times a day (QID) | INTRAMUSCULAR | Status: DC | PRN

## 2018-10-22 MED ORDER — BUPIVACAINE-EPINEPHRINE 0.25% -1:200000 IJ SOLN
INTRAMUSCULAR | Status: DC | PRN
  Administered 2018-10-22: 30 mL

## 2018-10-22 MED ORDER — EPHEDRINE SULFATE-NACL 50-0.9 MG/10ML-% IV SOSY
PREFILLED_SYRINGE | INTRAVENOUS | Status: DC | PRN
  Administered 2018-10-22: 5 mg via INTRAVENOUS

## 2018-10-22 MED ORDER — ONDANSETRON HCL 4 MG/2ML IJ SOLN
INTRAMUSCULAR | Status: DC | PRN
  Administered 2018-10-22: 4 mg via INTRAVENOUS

## 2018-10-22 MED ORDER — DEXAMETHASONE SODIUM PHOSPHATE 10 MG/ML IJ SOLN
INTRAMUSCULAR | Status: DC | PRN
  Administered 2018-10-22: 8 mg via INTRAVENOUS

## 2018-10-22 MED ORDER — GABAPENTIN 300 MG PO CAPS
300.0000 mg | ORAL_CAPSULE | ORAL | Status: AC
  Administered 2018-10-22: 300 mg via ORAL
  Filled 2018-10-22: qty 1

## 2018-10-22 MED ORDER — POLYETHYLENE GLYCOL 3350 17 G PO PACK: 17.0000 g | PACK | Freq: Every day | ORAL | Status: DC | PRN

## 2018-10-22 MED ORDER — CHLORHEXIDINE GLUCONATE CLOTH 2 % EX PADS: 6.0000 | MEDICATED_PAD | Freq: Once | CUTANEOUS | Status: DC

## 2018-10-22 MED ORDER — SODIUM CHLORIDE 0.9% FLUSH: 3.0000 mL | INTRAVENOUS | Status: DC | PRN

## 2018-10-22 MED ORDER — HYDROCODONE-ACETAMINOPHEN 5-325 MG PO TABS: 1.0000 | ORAL_TABLET | Freq: Four times a day (QID) | ORAL | 0 refills | Status: AC | PRN

## 2018-10-22 MED ORDER — FENTANYL CITRATE (PF) 100 MCG/2ML IJ SOLN: 25.0000 ug | INTRAMUSCULAR | Status: DC | PRN

## 2018-10-22 MED ORDER — CITALOPRAM HYDROBROMIDE 20 MG PO TABS
20.0000 mg | ORAL_TABLET | Freq: Every day | ORAL | Status: DC
  Administered 2018-10-23: 20 mg via ORAL
  Filled 2018-10-22: qty 1

## 2018-10-22 MED ORDER — IOPAMIDOL (ISOVUE-300) INJECTION 61%
INTRAVENOUS | Status: DC | PRN
  Administered 2018-10-22: 50 mL via INTRAVENOUS

## 2018-10-22 MED ORDER — LEVOTHYROXINE SODIUM 125 MCG PO TABS
125.0000 ug | ORAL_TABLET | Freq: Every day | ORAL | Status: DC
  Administered 2018-10-23: 125 ug via ORAL
  Filled 2018-10-22: qty 1

## 2018-10-22 MED ORDER — GABAPENTIN 300 MG PO CAPS
300.0000 mg | ORAL_CAPSULE | Freq: Two times a day (BID) | ORAL | Status: DC
  Administered 2018-10-22: 300 mg via ORAL
  Filled 2018-10-22 (×2): qty 1

## 2018-10-22 MED ORDER — LIP MEDEX EX OINT
1.0000 "application " | TOPICAL_OINTMENT | Freq: Two times a day (BID) | CUTANEOUS | Status: DC
  Administered 2018-10-22 – 2018-10-23 (×2): 1 via TOPICAL
  Filled 2018-10-22: qty 7

## 2018-10-22 MED ORDER — STERILE WATER FOR IRRIGATION IR SOLN
Status: DC | PRN
  Administered 2018-10-22: 1000 mL

## 2018-10-22 MED ORDER — LACTATED RINGERS IR SOLN
Status: DC | PRN
  Administered 2018-10-22: 1000 mL

## 2018-10-22 MED ORDER — IOPAMIDOL (ISOVUE-300) INJECTION 61%
INTRAVENOUS | Status: AC
  Filled 2018-10-22: qty 50

## 2018-10-22 MED ORDER — LORATADINE 10 MG PO TABS
10.0000 mg | ORAL_TABLET | Freq: Every day | ORAL | Status: DC
  Administered 2018-10-23: 10 mg via ORAL
  Filled 2018-10-22: qty 1

## 2018-10-22 MED ORDER — PREGABALIN 100 MG PO CAPS
100.0000 mg | ORAL_CAPSULE | Freq: Two times a day (BID) | ORAL | Status: DC
  Administered 2018-10-22 – 2018-10-23 (×2): 100 mg via ORAL
  Filled 2018-10-22 (×2): qty 1

## 2018-10-22 MED ORDER — FENTANYL CITRATE (PF) 100 MCG/2ML IJ SOLN
INTRAMUSCULAR | Status: DC | PRN
  Administered 2018-10-22: 100 ug via INTRAVENOUS

## 2018-10-22 MED ORDER — BUPIVACAINE LIPOSOME 1.3 % IJ SUSP
INTRAMUSCULAR | Status: DC | PRN
  Administered 2018-10-22: 20 mL

## 2018-10-22 MED ORDER — DIPHENHYDRAMINE HCL 12.5 MG/5ML PO ELIX: 12.5000 mg | ORAL_SOLUTION | Freq: Four times a day (QID) | ORAL | Status: DC | PRN

## 2018-10-22 MED ORDER — POTASSIUM CHLORIDE CRYS ER 20 MEQ PO TBCR
20.0000 meq | EXTENDED_RELEASE_TABLET | Freq: Two times a day (BID) | ORAL | Status: DC
  Administered 2018-10-22 – 2018-10-23 (×2): 20 meq via ORAL
  Filled 2018-10-22 (×2): qty 1

## 2018-10-22 MED ORDER — LACTATED RINGERS IV SOLN: 1000.0000 mL | Freq: Three times a day (TID) | INTRAVENOUS | Status: DC | PRN

## 2018-10-22 MED ORDER — METHOCARBAMOL 500 MG PO TABS: 750.0000 mg | ORAL_TABLET | Freq: Four times a day (QID) | ORAL | 1 refills | Status: DC | PRN

## 2018-10-22 MED ORDER — HYDRALAZINE HCL 20 MG/ML IJ SOLN: 5.0000 mg | INTRAMUSCULAR | Status: DC | PRN

## 2018-10-22 MED ORDER — ONDANSETRON 4 MG PO TBDP: 4.0000 mg | ORAL_TABLET | Freq: Four times a day (QID) | ORAL | Status: DC | PRN

## 2018-10-22 MED ORDER — ALBUMIN HUMAN 5 % IV SOLN
INTRAVENOUS | Status: DC | PRN
  Administered 2018-10-22: 10:00:00 via INTRAVENOUS

## 2018-10-22 MED ORDER — ROCURONIUM BROMIDE 10 MG/ML (PF) SYRINGE
PREFILLED_SYRINGE | INTRAVENOUS | Status: DC | PRN
  Administered 2018-10-22: 55 mg via INTRAVENOUS

## 2018-10-22 MED ORDER — SUGAMMADEX SODIUM 200 MG/2ML IV SOLN
INTRAVENOUS | Status: AC
  Filled 2018-10-22: qty 2

## 2018-10-22 MED ORDER — FUROSEMIDE 20 MG PO TABS
20.0000 mg | ORAL_TABLET | Freq: Every day | ORAL | Status: DC
  Administered 2018-10-23: 20 mg via ORAL
  Filled 2018-10-22: qty 1

## 2018-10-22 MED ORDER — SODIUM CHLORIDE 0.9% FLUSH
3.0000 mL | Freq: Two times a day (BID) | INTRAVENOUS | Status: DC
  Administered 2018-10-22: 3 mL via INTRAVENOUS

## 2018-10-22 MED ORDER — ACETAMINOPHEN 325 MG PO TABS: 325.0000 mg | ORAL_TABLET | Freq: Four times a day (QID) | ORAL | Status: DC | PRN

## 2018-10-22 MED ORDER — KETAMINE HCL 10 MG/ML IJ SOLN
INTRAMUSCULAR | Status: DC | PRN
  Administered 2018-10-22: 20 mg via INTRAVENOUS
  Administered 2018-10-22: 10 mg via INTRAVENOUS

## 2018-10-22 MED ORDER — ENALAPRILAT 1.25 MG/ML IV SOLN
0.6250 mg | Freq: Four times a day (QID) | INTRAVENOUS | Status: DC | PRN
  Filled 2018-10-22: qty 1

## 2018-10-22 MED ORDER — DIPHENHYDRAMINE HCL 50 MG/ML IJ SOLN: 12.5000 mg | Freq: Four times a day (QID) | INTRAMUSCULAR | Status: DC | PRN

## 2018-10-22 MED ORDER — ENOXAPARIN SODIUM 40 MG/0.4ML ~~LOC~~ SOLN
40.0000 mg | SUBCUTANEOUS | Status: DC
  Administered 2018-10-23: 40 mg via SUBCUTANEOUS
  Filled 2018-10-22: qty 0.4

## 2018-10-22 MED ORDER — LACTATED RINGERS IV SOLN
INTRAVENOUS | Status: DC
  Administered 2018-10-22: 09:00:00 via INTRAVENOUS

## 2018-10-22 MED ORDER — HYDROCODONE-ACETAMINOPHEN 5-325 MG PO TABS
1.0000 | ORAL_TABLET | ORAL | Status: DC | PRN
  Administered 2018-10-22 – 2018-10-23 (×3): 1 via ORAL
  Filled 2018-10-22 (×3): qty 1

## 2018-10-22 MED ORDER — HYDROMORPHONE HCL 1 MG/ML IJ SOLN: 0.5000 mg | INTRAMUSCULAR | Status: DC | PRN

## 2018-10-22 MED ORDER — PROCHLORPERAZINE EDISYLATE 10 MG/2ML IJ SOLN: 5.0000 mg | Freq: Four times a day (QID) | INTRAMUSCULAR | Status: DC | PRN

## 2018-10-22 MED ORDER — ESMOLOL HCL 100 MG/10ML IV SOLN
INTRAVENOUS | Status: DC | PRN
  Administered 2018-10-22: 10 mg via INTRAVENOUS

## 2018-10-22 MED ORDER — LOSARTAN POTASSIUM 50 MG PO TABS
100.0000 mg | ORAL_TABLET | Freq: Every day | ORAL | Status: DC
  Administered 2018-10-23: 100 mg via ORAL
  Filled 2018-10-22: qty 2

## 2018-10-22 MED ORDER — PANTOPRAZOLE SODIUM 40 MG PO TBEC
40.0000 mg | DELAYED_RELEASE_TABLET | Freq: Every day | ORAL | Status: DC
  Administered 2018-10-22 – 2018-10-23 (×2): 40 mg via ORAL
  Filled 2018-10-22 (×2): qty 1

## 2018-10-22 MED ORDER — METHOCARBAMOL 500 MG PO TABS
750.0000 mg | ORAL_TABLET | Freq: Four times a day (QID) | ORAL | Status: DC | PRN
  Administered 2018-10-23: 750 mg via ORAL
  Filled 2018-10-22: qty 2

## 2018-10-22 MED ORDER — ACETAMINOPHEN 500 MG PO TABS
1000.0000 mg | ORAL_TABLET | ORAL | Status: AC
  Administered 2018-10-22: 1000 mg via ORAL
  Filled 2018-10-22: qty 2

## 2018-10-22 MED ORDER — AMLODIPINE BESYLATE 10 MG PO TABS
10.0000 mg | ORAL_TABLET | Freq: Two times a day (BID) | ORAL | Status: DC
  Administered 2018-10-22 – 2018-10-23 (×2): 10 mg via ORAL
  Filled 2018-10-22 (×2): qty 1

## 2018-10-22 MED ORDER — EPHEDRINE 5 MG/ML INJ
INTRAVENOUS | Status: AC
  Filled 2018-10-22: qty 10

## 2018-10-22 MED ORDER — SIMETHICONE 80 MG PO CHEW: 40.0000 mg | CHEWABLE_TABLET | Freq: Four times a day (QID) | ORAL | Status: DC | PRN

## 2018-10-22 MED ORDER — PROCHLORPERAZINE MALEATE 10 MG PO TABS
10.0000 mg | ORAL_TABLET | Freq: Four times a day (QID) | ORAL | Status: DC | PRN
  Filled 2018-10-22: qty 1

## 2018-10-22 SURGICAL SUPPLY — 42 items
APPLIER CLIP 5 13 M/L LIGAMAX5 (MISCELLANEOUS) ×3
APR CLP MED LRG 5 ANG JAW (MISCELLANEOUS) ×1
BAG SPEC RTRVL 10 TROC 200 (ENDOMECHANICALS) ×1
CABLE HIGH FREQUENCY MONO STRZ (ELECTRODE) ×3 IMPLANT
CHLORAPREP W/TINT 26ML (MISCELLANEOUS) ×3 IMPLANT
CLIP APPLIE 5 13 M/L LIGAMAX5 (MISCELLANEOUS) ×1 IMPLANT
COVER MAYO STAND STRL (DRAPES) ×3 IMPLANT
COVER SURGICAL LIGHT HANDLE (MISCELLANEOUS) ×3 IMPLANT
COVER WAND RF STERILE (DRAPES) ×2 IMPLANT
DECANTER SPIKE VIAL GLASS SM (MISCELLANEOUS) ×1 IMPLANT
DRAIN CHANNEL 19F RND (DRAIN) IMPLANT
DRAPE C-ARM 42X120 X-RAY (DRAPES) ×3 IMPLANT
DRAPE WARM FLUID 44X44 (DRAPE) ×3 IMPLANT
DRSG TEGADERM 4X4.75 (GAUZE/BANDAGES/DRESSINGS) ×3 IMPLANT
ELECT REM PT RETURN 15FT ADLT (MISCELLANEOUS) ×3 IMPLANT
ENDOLOOP SUT PDS II  0 18 (SUTURE) ×2
ENDOLOOP SUT PDS II 0 18 (SUTURE) IMPLANT
EVACUATOR SILICONE 100CC (DRAIN) IMPLANT
GAUZE SPONGE 2X2 8PLY STRL LF (GAUZE/BANDAGES/DRESSINGS) ×1 IMPLANT
GLOVE ECLIPSE 8.0 STRL XLNG CF (GLOVE) ×3 IMPLANT
GLOVE INDICATOR 8.0 STRL GRN (GLOVE) ×3 IMPLANT
GOWN STRL REUS W/TWL XL LVL3 (GOWN DISPOSABLE) ×8 IMPLANT
IRRIG SUCT STRYKERFLOW 2 WTIP (MISCELLANEOUS) ×3
IRRIGATION SUCT STRKRFLW 2 WTP (MISCELLANEOUS) ×1 IMPLANT
KIT BASIN OR (CUSTOM PROCEDURE TRAY) ×3 IMPLANT
PAD POSITIONING PINK XL (MISCELLANEOUS) ×3 IMPLANT
POUCH RETRIEVAL ECOSAC 10 (ENDOMECHANICALS) IMPLANT
POUCH RETRIEVAL ECOSAC 10MM (ENDOMECHANICALS) ×2
PROTECTOR NERVE ULNAR (MISCELLANEOUS) IMPLANT
SCISSORS LAP 5X35 DISP (ENDOMECHANICALS) ×3 IMPLANT
SET CHOLANGIOGRAPH MIX (MISCELLANEOUS) ×3 IMPLANT
SHEARS HARMONIC ACE PLUS 36CM (ENDOMECHANICALS) ×3 IMPLANT
SPONGE GAUZE 2X2 STER 10/PKG (GAUZE/BANDAGES/DRESSINGS) ×2
SUT MNCRL AB 4-0 PS2 18 (SUTURE) ×3 IMPLANT
SUT PDS AB 1 CT1 27 (SUTURE) ×10 IMPLANT
SYR 20CC LL (SYRINGE) ×3 IMPLANT
TOWEL OR 17X26 10 PK STRL BLUE (TOWEL DISPOSABLE) ×3 IMPLANT
TOWEL OR NON WOVEN STRL DISP B (DISPOSABLE) ×3 IMPLANT
TRAY LAPAROSCOPIC (CUSTOM PROCEDURE TRAY) ×3 IMPLANT
TROCAR BLADELESS OPT 5 100 (ENDOMECHANICALS) ×3 IMPLANT
TROCAR BLADELESS OPT 5 150 (ENDOMECHANICALS) ×3 IMPLANT
TUBING INSUF HEATED (TUBING) ×3 IMPLANT

## 2018-10-22 NOTE — H&P (Signed)
[name] DOB: February 28, 1941  Patient Care Team: [name], MD as PCP - General (Internal Medicine) Artis Delay, MD as Consulting Physician (Hematology and Oncology) Karie Soda, MD as Consulting Physician (General Surgery) Elvis Coil, MD as Consulting Physician (Nephrology) Runell Gess, MD as Consulting Physician (Cardiology)  ` Patient sent for surgical consultation at the request of [name], MD, [facility]  Chief Complaint: Upper abdominal pain and gallstones. ` ` The patient is a pleasant woman comes in today with her son. She had episode of severe upper abdominal pain with nausea vomiting and bloating after having some fried chicken. Went to the ER in August. Most of work up [employer] except for gallstones noted. Rumination to follow-up with primary care physician and consider surgery referral. Patient do not hear anything so she went to her primary care office. Mentioned the attack. She actually recalls a similar episode a few years before. Some intermittent discomfort despite trying to eat better. Because of gallstones, surgical referral requested. Patient denies any heartburn or reflux. 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 That seems to be under much better control now. Not on any immunosuppressive. She does get short of breath walking a couple blocks with a cane but has not had any dysrhythmias or coronary events that she recalls. She has seen her cardiologist, Dr. Nanetta Batty, in the past. Patient usually moves her bowels most days. Had a perforated appendix that required open surgery when she was 77 years old. She's had an abdominal hysterectomy. No other abdominal surgeries.   No personal nor family history of GI/colon cancer, inflammatory bowel disease, irritable bowel syndrome, allergy such as Celiac Sprue, dietary/dairy problems, colitis, ulcers nor gastritis. No recent sick  contacts/gastroenteritis. No travel outside the country. No changes in diet. No dysphagia to solids or liquids. No significant heartburn or reflux. No hematochezia, hematemesis, coffee ground emesis. No evidence of prior gastric/peptic ulceration.  No new events.  Cleared by cardiology Dr Allyson Sabal  (Review of systems as stated in this history (HPI) or in the review of systems. Otherwise all other 12 point ROS are negative) ` ` `   Past Surgical History Ethlyn Gallery, CMA; 08/18/2018 8:54 AM) Appendectomy  Diagnostic Studies History [name], CMA; 08/18/2018 8:54 AM) Mammogram >3 years ago  Allergies [name], CMA; 08/18/2018 8:57 AM) Codeine/Codeine Derivatives Morphine Derivatives Penicillins Sulfa Drugs Lactose Fish OxyCODONE HCl (Abuse Deter) *ANALGESICS - OPIOID* Nausea, Vomiting. traMADol HCl *ANALGESICS - OPIOID* Triamcinolone *CHEMICALS* Swelling.  Medication History ([name], CMA; 08/18/2018 9:00 AM) amLODIPine Besylate (10MG  Tablet, Oral) Active. Citalopram Hydrobromide (20MG  Tablet, Oral) Active. HYDROcodone-Acetaminophen (5-325MG  Tablet, Oral) Active. Levothyroxine Sodium ( Tablet, Oral) Active. Loratadine (10MG  Tablet, Oral) Active. Losartan Potassium (100MG  Tablet, Oral) Active. Lyrica (150MG  Capsule, Oral) Active. Ondansetron (4MG  Tablet Disint, Oral) Active. Pantoprazole Sodium (40MG  Tablet DR, Oral) Active. Klor-Con M20 ( Tablet ER, Oral) Active. Furosemide (20MG  Tablet, Oral) Active. Lyrica (100MG  Capsule, Oral) Active. Tylenol PM Extra Strength (500-25MG  Tablet, Oral) Active. Medications Reconciled  Social History Ethlyn Gallery, CMA; 08/18/2018 8:54 AM) Alcohol use Remotely quit alcohol use. No drug use  Pregnancy / Birth History Ethlyn Gallery, CMA; 08/18/2018 8:54 AM) Age at menarche 9 years. Age of menopause <44 Gravida 1 Irregular periods Maternal age  8-35 Para 1  Other Problems Ethlyn Gallery, CMA; 08/18/2018 8:54 AM) Anxiety Disorder Arthritis Back Pain Bladder Problems Gastroesophageal Reflux Disease Heart murmur Hemorrhoids Hypercholesterolemia     Review of Systems [name] CMA;  08/18/2018 8:54 AM) General Present- Weight Loss. Not Present- Appetite Loss, Chills, Fatigue, Fever, Night Sweats and Weight Gain. Skin Not Present- Change in Wart/Mole, Dryness, Hives, Jaundice, New Lesions, Non-Healing Wounds, Rash and Ulcer. HEENT Present- Seasonal Allergies and Wears glasses/contact lenses. Not Present- Earache, Hearing Loss, Hoarseness, Nose Bleed, Oral Ulcers, Ringing in the Ears, Sinus Pain, Sore Throat, Visual Disturbances and Yellow Eyes. Respiratory Present- Snoring. Not Present- Bloody sputum, Chronic Cough, Difficulty Breathing and Wheezing. Cardiovascular Present- Swelling of Extremities. Not Present- Chest Pain, Difficulty Breathing Lying Down, Leg Cramps, Palpitations, Rapid Heart Rate and Shortness of Breath. Gastrointestinal Present- Constipation. Not Present- Abdominal Pain, Bloating, Bloody Stool, Change in Bowel Habits, Chronic diarrhea, Difficulty Swallowing, Excessive gas, Gets full quickly at meals, Hemorrhoids, Indigestion, Nausea, Rectal Pain and Vomiting. Musculoskeletal Present- Muscle Pain. Not Present- Back Pain, Joint Pain, Joint Stiffness, Muscle Weakness and Swelling of Extremities. Neurological Present- Trouble walking. Not Present- Decreased Memory, Fainting, Headaches, Numbness, Seizures, Tingling, Tremor and Weakness.  Vitals ([name] CMA; 08/18/2018 8:55 AM) 08/18/2018 8:54 AM Weight: 216 lb Height: 66in Body Surface Area: 2.07 m Body Mass Index: 34.86 kg/m  Pulse: 68 (Regular)  BP: 142/70 (Sitting, Left Arm, Standard)    BP (!) 187/93   Temp 98.2 F (36.8 C) (Oral)   Resp 13   Ht 5' 5.5" (1.664 m)   Wt 90.7 kg   SpO2 98%   BMI 32.78 kg/m     Physical Exam [name] MD; 08/18/2018 9:36 AM)  General Mental Status-Alert. General Appearance-Not in acute distress, Not Sickly. Orientation-Oriented X3. Hydration-Well hydrated. Voice-Normal.  Integumentary Global Assessment Upon inspection and palpation of skin surfaces of the - Axillae: non-tender, no inflammation or ulceration, no drainage. and Distribution of scalp and body hair is normal. General Characteristics Temperature - normal warmth is noted.  Head and Neck Head-normocephalic, atraumatic with no lesions or palpable masses. Face Global Assessment - atraumatic, no absence of expression. Neck Global Assessment - no abnormal movements, no bruit auscultated on the right, no bruit auscultated on the left, no decreased range of motion, non-tender. Trachea-midline. Thyroid Gland Characteristics - non-tender.  Eye Eyeball - Left-Extraocular movements intact, No Nystagmus. Eyeball - Right-Extraocular movements intact, No Nystagmus. Cornea - Left-No Hazy. Cornea - Right-No Hazy. Sclera/Conjunctiva - Left-No scleral icterus, No Discharge. Sclera/Conjunctiva - Right-No scleral icterus, No Discharge. Pupil - Left-Direct reaction to light normal. Pupil - Right-Direct reaction to light normal. Note: Wears glasses. Vision corrected  ENMT Ears Pinna - Left - no drainage observed, no generalized tenderness observed. Right - no drainage observed, no generalized tenderness observed. Nose and Sinuses External Inspection of the Nose - no destructive lesion observed. Inspection of the nares - Left - quiet respiration. Right - quiet respiration. Mouth and Throat Lips - Upper Lip - no fissures observed, no pallor noted. Lower Lip - no fissures observed, no pallor noted. Nasopharynx - no discharge present. Oral Cavity/Oropharynx - Tongue - no dryness observed. Oral Mucosa - no cyanosis observed. Hypopharynx - no evidence of airway  distress observed.  Chest and Lung Exam Inspection Movements - Normal and Symmetrical. Accessory muscles - No use of accessory muscles in breathing. Palpation Palpation of the chest reveals - Non-tender. Auscultation Breath sounds - Normal and Clear.  Cardiovascular Auscultation Rhythm - Regular. Murmurs & Other Heart Sounds - Auscultation of the heart reveals - No Murmurs and No Systolic Clicks.  Abdomen Inspection Inspection of the abdomen reveals - No Visible peristalsis and No Abnormal pulsations. Umbilicus - No  Bleeding, No Urine drainage. Palpation/Percussion Palpation and Percussion of the abdomen reveal - Soft, Non Tender, No Rebound tenderness, No Rigidity (guarding) and No Cutaneous hyperesthesia. Note: Abdomen obese but soft. Mild epigastric and right upper quadrant discomfort. Not severely distended. No distasis recti. No umbilical or other anterior abdominal wall hernias  Female Genitourinary Sexual Maturity Tanner 5 - Adult hair pattern. Note: No vaginal bleeding nor discharge  Peripheral Vascular Upper Extremity Inspection - Left - No Cyanotic nailbeds, Not Ischemic. Right - No Cyanotic nailbeds, Not Ischemic.  Neurologic Neurologic evaluation reveals -normal attention span and ability to concentrate, able to name objects and repeat phrases. Appropriate fund of knowledge , normal sensation and normal coordination. Mental Status Affect - not angry, not paranoid. Cranial Nerves-Normal Bilaterally. Gait-Normal.  Neuropsychiatric Mental status exam performed with findings of-able to articulate well with normal speech/language, rate, volume and coherence, thought content normal with ability to perform basic computations and apply abstract reasoning and no evidence of hallucinations, delusions, obsessions or homicidal/suicidal ideation.  Musculoskeletal Global Assessment Spine, Ribs and Pelvis - no instability, subluxation or laxity. Right Upper  Extremity - no instability, subluxation or laxity.  Lymphatic Head & Neck  General Head & Neck Lymphatics: Bilateral - Description - No Localized lymphadenopathy. Axillary  General Axillary Region: Bilateral - Description - No Localized lymphadenopathy. Femoral & Inguinal  Generalized Femoral & Inguinal Lymphatics: Left - Description - No Localized lymphadenopathy. Right - Description - No Localized lymphadenopathy.    Assessment & Plan [name] MD; 08/18/2018 9:36 AM)  CHRONIC CHOLECYSTITIS WITH CALCULUS (K80.10) Impression: Rather classic story biliary colic with gallstones. The rest of the differential diagnosis seemed unlikely.  I think she would benefit from cholecystectomy. Reasonable start out single site since she has not had any upper abdominal surgery.  Because of her shortness of breath and limited activity level with a cane, I think it would be a good idea to reach out her cardiologist to make sure she safe for surgery. Had underwhelming Myoview 5 years ago but has been a while. ADDENDUM:  Cleared by Dr Allyson Sabal  The anatomy & physiology of hepatobiliary & pancreatic function was discussed. The pathophysiology of gallbladder dysfunction was discussed. Natural history risks without surgery was discussed. I feel the risks of no intervention will lead to serious problems that outweigh the operative risks; therefore, I recommended cholecystectomy to remove the pathology. I explained laparoscopic techniques with possible need for an open approach. Probable cholangiogram to evaluate the bilary tract was explained as well.  Risks such as bleeding, infection, abscess, leak, injury to other organs, need for further treatment, heart attack, death, and other risks were discussed. I noted a good likelihood this will help address the problem. Possibility that this will not correct all abdominal symptoms was explained. Goals of post-operative recovery were discussed  as well. We will work to minimize complications. An educational handout further explaining the pathology and treatment options was given as well. Questions were answered. The patient expresses understanding & wishes to proceed with surgery.  I have re-reviewed the the patient's records, history, medications, and allergies.  I have re-examined the patient.  I again discussed intraoperative plans and goals of post-operative recovery.  The patient agrees to proceed.  [name]  January 19, 1941 [id number]  Patient Care Team: [name], MD as PCP - General (Internal Medicine) Artis Delay, MD as Consulting Physician (Hematology and Oncology) Karie Soda, MD as Consulting Physician (General Surgery) Elvis Coil, MD as Consulting Physician (Nephrology) Runell Gess,  MD as Consulting Physician (Cardiology)  Patient Active Problem List   Diagnosis Date Noted  . Idiopathic thrombocytopenic purpura (HCC) 08/11/2013    Priority: Medium  . Preoperative clearance 09/08/2018  . Chronic constipation 11/01/2016  . CKD (chronic kidney disease) stage 3, GFR 30-59 ml/min (HCC) 08/31/2016  . Chronic pain syndrome 08/31/2016  . Neuropathy 08/31/2016  . Arthritis 05/15/2016  . Chest pain 05/14/2016  . Hypokalemia 11/15/2013  . Dysuria 10/14/2013  . Anemia of chronic disease 09/13/2013  . Family history of malignant neoplasm of gastrointestinal tract 09/13/2013  . Fatigue 08/02/2013  . COLONIC POLYPS, HX OF 06/19/2010  . COUGH 04/06/2010  . ANEMIA 04/02/2010  . GASTROENTERITIS, HX OF 12/18/2008  . PULMONARY NODULE, LEFT LOWER LOBE 09/02/2007  . GASTROESOPHAGEAL REFLUX DISEASE 09/02/2007  . IRRITABLE BOWEL SYNDROME 09/02/2007  . ANXIETY DISORDER, GENERALIZED 07/14/2007  . Hypothyroidism 07/09/2007  . DYSLIPIDEMIA 07/09/2007  . HYPERCALCEMIA 07/09/2007  . Essential hypertension 07/09/2007  . ALLERGIC RHINITIS 07/09/2007  . BRONCHIECTASIS 07/09/2007  . RENAL INSUFFICIENCY 07/09/2007  .  OSTEOARTHRITIS, KNEES, BILATERAL 07/09/2007  . Backache 07/09/2007  . GRAVE'S DISEASE, HX OF 07/09/2007  . HEART MURMUR, HX OF 07/09/2007    Past Medical History:  Diagnosis Date  . Allergic rhinitis   . Anxiety   . Back pain   . Bronchiectasis   . Dyslipidemia   . Dysuria 10/14/2013  . Fatigue 08/02/2013  . GERD (gastroesophageal reflux disease)    REPORTS BECAUSE OF THIS SHE HAS TO SLEEP WITH HEAD ELEVATED   . History of Graves' disease   . Hx of colonic polyp   . Hypercalcemia   . Hyperlipidemia   . Hypertension   . Hypokalemia 11/15/2013  . 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 Murmur, heart   . Osteoarthritis, knee   . Renal insufficiency   . Shingles 2018  . Thoracic aortic aneurysm (HCC)   . Thrombocytopenia, unspecified (HCC) 08/02/2013   REPORTS AFTER 2012 , SHE WAS TOLD HER PLATELETS WERE VERY LOW AND WAS MADE TO STAY IN THE HOSPITAL FOR 3 DAYS WITH  . Thrush 10/14/2013  . Thrush, oral 09/02/2013  . Thyroid disease   . UTI (lower urinary tract infection) 10/14/2013    Past Surgical History:  Procedure Laterality Date  . ABDOMINAL HYSTERECTOMY     b/c fibroids  . APPENDECTOMY    . KNEE ARTHROPLASTY Right 10/2007  . TOTAL KNEE ARTHROPLASTY Bilateral    2008, 2012    Social History   Socioeconomic History  . Marital status: Single    Spouse name: Not on file  . Number of children: Not on file  . Years of education: Not on file  . Highest education level: Not on file  Occupational History  . Not on file  Social Needs  . Financial resource strain: Not on file  . Food insecurity:    Worry: Not on file    Inability: Not on file  . Transportation needs:    Medical: Not on file    Non-medical: Not on file  Tobacco Use  . Smoking status: Former Smoker    Packs/day: 1.00    Years: 10.00    Pack years: 10.00    Types: Cigarettes    Last attempt to quit: 11/11/1976    Years since quitting: 41.9  . Smokeless tobacco: Never Used  Substance  and Sexual Activity  . Alcohol use: No  . Drug use: No  . Sexual activity: Not on file  Lifestyle  .  Physical activity:    Days per week: Not on file    Minutes per session: Not on file  . Stress: Not on file  Relationships  . Social connections:    Talks on phone: Not on file    Gets together: Not on file    Attends religious service: Not on file    Active member of club or organization: Not on file    Attends meetings of clubs or organizations: Not on file    Relationship status: Not on file  . Intimate partner violence:    Fear of current or ex partner: Not on file    Emotionally abused: Not on file    Physically abused: Not on file    Forced sexual activity: Not on file  Other Topics Concern  . Not on file  Social History Narrative  . Not on file    Family History  Problem Relation Age of Onset  . Kidney cancer Brother 20  . Prostate cancer Brother   . Colon cancer Brother   . Breast cancer Maternal Aunt   . Liver cancer Maternal Uncle   . Colon polyps Brother   . Heart disease Mother   . Kidney disease Mother        on dialysis  . Irritable bowel syndrome Maternal Aunt     Medications Prior to Admission  Medication Sig Dispense Refill Last Dose  . amLODipine (NORVASC) 10 MG tablet Take 10 mg by mouth 2 (two) times daily.   4 10/22/2018 at 0600  . citalopram (CELEXA) 20 MG tablet Take 20 mg by mouth daily.   10/22/2018 at 0600  . clotrimazole (LOTRIMIN) 1 % cream Apply 1 application topically daily as needed (for irritation).     Marland Kitchen ergocalciferol (VITAMIN D2) 1.25 MG (50000 UT) capsule Take 50,000 Units by mouth every Friday.     . furosemide (LASIX) 20 MG tablet Take 20 mg by mouth daily.    Taking  . levothyroxine (SYNTHROID, LEVOTHROID) 125 MCG tablet Take 125 mcg by mouth daily before breakfast.    10/22/2018 at 0600  . loratadine (CLARITIN) 10 MG tablet TAKE 1 TABLET (10 MG TOTAL) BY MOUTH DAILY. (Patient taking differently: Take 10 mg by mouth daily. ) 90  tablet 1 10/22/2018 at 0600  . losartan (COZAAR) 100 MG tablet Take 100 mg by mouth daily.   10/21/2018 at Unknown time  . pantoprazole (PROTONIX) 40 MG tablet TAKE 1 TABLET BY MOUTH DAILY (Patient taking differently: Take 40 mg by mouth daily. ) 30 tablet 0 10/21/2018 at Unknown time  . potassium chloride SA (K-DUR,KLOR-CON) 20 MEQ tablet Take 1 tablet (20 mEq total) by mouth 2 (two) times daily. 20 tablet 0 10/21/2018 at Unknown time  . acetaminophen (TYLENOL) 500 MG tablet Take 500 mg by mouth every 6 (six) hours as needed.   Taking  . pregabalin (LYRICA) 100 MG capsule Take 100 mg by mouth 2 (two) times daily.    Unknown at Unknown time  . Respiratory Therapy Supplies (FLUTTER) DEVI Use as directed (Patient not taking: Reported on 10/14/2018) 1 each 0 Not Taking at Unknown time    Current Facility-Administered Medications  Medication Dose Route Frequency Provider Last Rate Last Dose  . bupivacaine liposome (EXPAREL) 1.3 % injection 266 mg  20 mL Infiltration On Call to OR Karie Soda, MD      . Chlorhexidine Gluconate Cloth 2 % PADS 6 each  6 each Topical Once Karie Soda, MD  And  . Chlorhexidine Gluconate Cloth 2 % PADS 6 each  6 each Topical Once Karie Soda, MD      . lactated ringers infusion   Intravenous Continuous Elmer Picker, MD 20 mL/hr at 10/22/18 0839       Allergies  Allergen Reactions  . Cauliflower [Brassica Oleracea Italica] Other (See Comments)    Showed up on allergy test  . Codeine Nausea And Vomiting  . Fish Allergy Nausea Only  . Lactose Intolerance (Gi) Diarrhea  . Morphine And Related Nausea And Vomiting  . Oxycodone Nausea And Vomiting  . Penicillins Nausea Only and Other (See Comments)    REACTION: headache, tremors, Nausea Has patient had a PCN reaction causing immediate rash, facial/tongue/throat swelling, SOB or lightheadedness with hypotension: No Has patient had a PCN reaction causing severe rash involving mucus membranes or skin  necrosis: No Has patient had a PCN reaction that required hospitalization No Has patient had a PCN reaction occurring within the last 10 years: No If all of the above answers are "NO", then may proceed with Cephalosporin use.   . Sulfonamide Derivatives Swelling and Other (See Comments)    REACTION: mouth swells  . Tramadol Hcl Other (See Comments)    REACTION: GI Intolerance  . Triamcinolone Swelling and Other (See Comments)    REACTION: angio edema    BP (!) 187/93   Temp 98.2 F (36.8 C) (Oral)   Resp 13   Ht 5' 5.5" (1.664 m)   Wt 90.7 kg   SpO2 98%   BMI 32.78 kg/m   Labs: Results for orders placed or performed during the hospital encounter of 10/22/18 (from the past 48 hour(s))  Potassium     Status: None   Collection Time: 10/22/18  8:33 AM  Result Value Ref Range   Potassium 3.9 3.5 - 5.1 mmol/L    Comment: Performed at [facility], [address], [place], [place] [address]    Imaging / Studies: No results found.   [name], M.D., F.A.C.S. Gastrointestinal and Minimally Invasive Surgery [facility] [address], [address] [place], [place] [address] [phone] Main / Paging  10/22/2018 9:21 AM    [name], MD, FACS, MASCRS Gastrointestinal and Minimally Invasive Surgery    [address], [address] [place], [place] [address] [phone] Main / Paging [phone] Fax

## 2018-10-22 NOTE — Transfer of Care (Signed)
Immediate Anesthesia Transfer of Care Note  Patient: [name]  Procedure(s) Performed: Procedure(s): LAPAROSCOPIC CHOLECYSTECTOMY SINGLE SITE (N/A)  Patient Location: PACU  Anesthesia Type:General  Level of Consciousness:  sedated, patient cooperative and responds to stimulation  Airway & Oxygen Therapy:Patient Spontanous Breathing and Patient connected to face mask oxgen  Post-op Assessment:  Report given to PACU RN and Post -op Vital signs reviewed and stable  Post vital signs:  Reviewed and stable  Last Vitals:  Vitals:   10/22/18 0815  BP: (!) 187/93  Resp: 13  Temp: 36.8 C  SpO2: 98%    Complications: No apparent anesthesia complications

## 2018-10-22 NOTE — Discharge Instructions (Signed)
LAPAROSCOPIC SURGERY: POST OP INSTRUCTIONS ° °###################################################################### ° °EAT °Gradually transition to a high fiber diet with a fiber supplement over the next few weeks after discharge.  Start with a pureed / full liquid diet (see below) ° °WALK °Walk an hour a day.  Control your pain to do that.   ° °CONTROL PAIN °Control pain so that you can walk, sleep, tolerate sneezing/coughing, go up/down stairs. ° °HAVE A BOWEL MOVEMENT DAILY °Keep your bowels regular to avoid problems.  OK to try a laxative to override constipation.  OK to use an antidairrheal to slow down diarrhea.  Call if not better after 2 tries ° °CALL IF YOU HAVE PROBLEMS/CONCERNS °Call if you are still struggling despite following these instructions. °Call if you have concerns not answered by these instructions ° °###################################################################### ° ° ° °1. DIET: Follow a light bland diet the first 24 hours after arrival home, such as soup, liquids, crackers, etc.  Be sure to include lots of fluids daily.  Avoid fast food or heavy meals as your are more likely to get nauseated.  Eat a low fat the next few days after surgery.   ° °2. Take your usually prescribed home medications unless otherwise directed. ° °3. PAIN CONTROL: °a. Pain is best controlled by a usual combination of three different methods TOGETHER: °i. Ice/Heat °ii. Over the counter pain medication °iii. Prescription pain medication °b. Most patients will experience some swelling and bruising around the incisions.  Ice packs or heating pads (30-60 minutes up to 6 times a day) will help. Use ice for the first few days to help decrease swelling and bruising, then switch to heat to help relax tight/sore spots and speed recovery.  Some people prefer to use ice alone, heat alone, alternating between ice & heat.  Experiment to what works for you.  Swelling and bruising can take several weeks to resolve.   °c. It  is helpful to take an over-the-counter pain medication regularly for the first few weeks.  Choose one of the following that works best for you: °i. Naproxen (Aleve, etc)  Two 220mg tabs twice a day °ii. Ibuprofen (Advil, etc) Three 200mg tabs four times a day (every meal & bedtime) °iii. Acetaminophen (Tylenol, etc) 500-650mg four times a day (every meal & bedtime) °d. A  prescription for pain medication (such as oxycodone, hydrocodone, tramadol, gabapentin, methocarbamol, etc) should be given to you upon discharge.  Take your pain medication as prescribed.  °i. If you are having problems/concerns with the prescription medicine (does not control pain, nausea, vomiting, rash, itching, etc), please call us [phone] to see if we need to switch you to a different pain medicine that will work better for you and/or control your side effect better. °ii. If you need a refill on your pain medication, please give us 48 hour notice.  contact your pharmacy.  They will contact our office to request authorization. Prescriptions will not be filled after 5 pm or on week-ends ° °4. Avoid getting constipated.   °a. Between the surgery and the pain medications, it is common to experience some constipation.   °b. Increasing fluid intake and taking a fiber supplement (such as Metamucil, Citrucel, FiberCon, MiraLax, etc) 1-2 times a day regularly will usually help prevent this problem from occurring.   °c. A mild laxative (prune juice, Milk of Magnesia, MiraLax, etc) should be taken according to package directions if there are no bowel movements after 48 hours.   °5. Watch out for   diarrhea.   °a. If you have many loose bowel movements, simplify your diet to bland foods & liquids for a few days.   °b. Stop any stool softeners and decrease your fiber supplement.   °c. Switching to mild anti-diarrheal medications (Kayopectate, Pepto Bismol) can help.   °d. If this worsens or does not improve, please call us. ° °6. Wash / shower every  day.  You may shower over the dressings as they are waterproof.  Continue to shower over incision(s) after the dressing is off. ° °7. Remove your waterproof bandages 5 days after surgery.  You may leave the incision open to air.  You may replace a dressing/Band-Aid to cover the incision for comfort if you wish.  ° °8. ACTIVITIES as tolerated:   °a. You may resume regular (light) daily activities beginning the next day--such as daily self-care, walking, climbing stairs--gradually increasing activities as tolerated.  If you can walk 30 minutes without difficulty, it is safe to try more intense activity such as jogging, treadmill, bicycling, low-impact aerobics, swimming, etc. °b. Save the most intensive and strenuous activity for last such as sit-ups, heavy lifting, contact sports, etc  Refrain from any heavy lifting or straining until you are off narcotics for pain control.   °c. DO NOT PUSH THROUGH PAIN.  Let pain be your guide: If it hurts to do something, don't do it.  Pain is your body warning you to avoid that activity for another week until the pain goes down. °d. You may drive when you are no longer taking prescription pain medication, you can comfortably wear a seatbelt, and you can safely maneuver your car and apply brakes. °e. You may have sexual intercourse when it is comfortable. ° °9. FOLLOW UP in our office °a. Please call [facility] at [phone] to set up an appointment to see your surgeon in the office for a follow-up appointment approximately 2-3 weeks after your surgery. °b. Make sure that you call for this appointment the day you arrive home to insure a convenient appointment time. ° °10. IF YOU HAVE DISABILITY OR FAMILY LEAVE FORMS, BRING THEM TO THE OFFICE FOR PROCESSING.  DO NOT GIVE THEM TO YOUR DOCTOR. ° ° °WHEN TO CALL US [phone]: °1. Poor pain control °2. Reactions / problems with new medications (rash/itching, nausea, etc)  °3. Fever over 101.5 F (38.5 C) °4. Inability to  urinate °5. Nausea and/or vomiting °6. Worsening swelling or bruising °7. Continued bleeding from incision. °8. Increased pain, redness, or drainage from the incision ° ° The clinic staff is available to answer your questions during regular business hours (8:30am-5pm).  Please don’t hesitate to call and ask to speak to one of our nurses for clinical concerns.  ° If you have a medical emergency, go to the nearest emergency room or call 911. ° A surgeon from [facility] is always on call at the hospitals ° ° °[facility] °[address], [address], [place], [place]  [address] ? °MAIN: [phone] ? TOLL FREE: [phone] ?  °FAX [phone] °[email or web address] ° ° ° ° °Cholecystitis °Cholecystitis is inflammation of the gallbladder. It is often called a gallbladder attack. The gallbladder is a pear-shaped organ that lies beneath the liver on the right side of the body. The gallbladder stores bile, which is a fluid that helps the body to digest fats. If bile builds up in your gallbladder, your gallbladder becomes inflamed. This condition may occur suddenly (be acute). Repeat   episodes of acute cholecystitis or prolonged episodes may lead to a long-term (chronic) condition. Cholecystitis is serious and it requires treatment. °What are the causes? °The most common cause of this condition is gallstones. Gallstones can block the tube (duct) that carries bile out of your gallbladder. This causes bile to build up. Other causes of this condition include: °· Damage to the gallbladder due to a decrease in blood flow. °· Infections in the bile ducts. °· Scars or kinks in the bile ducts. °· Tumors in the liver, pancreas, or gallbladder. ° °What increases the risk? °This condition is more likely to develop in: °· People who have sickle cell disease. °· People who take birth control pills or use estrogen. °· People who have alcoholic liver disease. °· People who have liver  cirrhosis. °· People who have their nutrition delivered through a vein (parenteral nutrition). °· People who do not eat or drink (do fasting) for a long period of time. °· People who are obese. °· People who have rapid weight loss. °· People who are pregnant. °· People who have increased triglyceride levels. °· People who have pancreatitis. ° °What are the signs or symptoms? °Symptoms of this condition include: °· Abdominal pain, especially in the upper right area of the abdomen. °· Abdominal tenderness or bloating. °· Nausea. °· Vomiting. °· Fever. °· Chills. °· Yellowing of the skin and the whites of the eyes (jaundice). ° °How is this diagnosed? °This condition is diagnosed with a medical history and physical exam. You may also have other tests, including: °· Imaging tests, such as: °? An ultrasound of the gallbladder. °? A CT scan of the abdomen. °? A gallbladder nuclear scan (HIDA scan). This scan allows your health care provider to see the bile moving from your liver to your gallbladder and to your small intestine. °? MRI. °· Blood tests, such as: °? A complete blood count, because the white blood cell count may be higher than normal. °? Liver function tests, because some levels may be higher than normal with certain types of gallstones. ° °How is this treated? °Treatment may include: °· Fasting for a certain amount of time. °· IV fluids. °· Medicine to treat pain or vomiting. °· Antibiotic medicine. °· Surgery to remove your gallbladder (cholecystectomy). This may happen immediately or at a later time. ° °Follow these instructions at home: °Home care will depend on your treatment. In general: °· Take over-the-counter and prescription medicines only as told by your health care provider. °· If you were prescribed an antibiotic medicine, take it as told by your health care provider. Do not stop taking the antibiotic even if you start to feel better. °· Follow instructions from your health care provider about  what to eat or drink. When you are allowed to eat, avoid eating or drinking anything that triggers your symptoms. °· Keep all follow-up visits as told by your health care provider. This is important. ° °Contact a health care provider if: °· Your pain is not controlled with medicine. °· You have a fever. °Get help right away if: °· Your pain moves to another part of your abdomen or to your back. °· You continue to have symptoms or you develop new symptoms even with treatment. °This information is not intended to replace advice given to you by your health care provider. Make sure you discuss any questions you have with your health care provider. °Document Released: 10/28/2005 Document Revised: 03/07/2016 Document Reviewed: 02/08/2015 °Elsevier Interactive Patient Education © 2018   [employer] ° °

## 2018-10-22 NOTE — Op Note (Signed)
10/22/2018  PATIENT:  [name]  77 y.o. female  Patient Care Team: [name], MD as PCP - General (Internal Medicine) Artis Delay, MD as Consulting Physician (Hematology and Oncology) Karie Soda, MD as Consulting Physician (General Surgery) Elvis Coil, MD as Consulting Physician (Nephrology) Runell Gess, MD as Consulting Physician (Cardiology)  PRE-OPERATIVE DIAGNOSIS:    Chronic Calculus cholecystitis  POST-OPERATIVE DIAGNOSIS:   Chronic Calculus cholecystitis  Liver: normal  PROCEDURE:  SINGLE SITE Laparoscopic cholecystectomy  SURGEON:  [name], MD, FACS.  ASSISTANT: OR Staff   ANESTHESIA:    General with endotracheal intubation Local anesthetic as a field block  EBL:  (See Anesthesia Intraoperative Record) Total I/O In: 850 [I.V.:600; IV Piggyback:250] Out: 15 [Blood:15]  Delay start of Pharmacological VTE agent (>24hrs) due to surgical blood loss or risk of bleeding:  no  DRAINS: None   SPECIMEN: Gallbladder    DISPOSITION OF SPECIMEN:  PATHOLOGY  COUNTS:  YES  PLAN OF CARE: Admit for overnight observation  PATIENT DISPOSITION:  PACU - hemodynamically stable.  INDICATION: Pleasant elderly woman with episodes of abdominal pain and pericolic suspicious for chronic cholecystitis.  For cholecystectomy.  Evaluated by cardiology and cleared.  Hypokalemia preoperatively noted and corrected.  The anatomy & physiology of hepatobiliary & pancreatic function was discussed.  The pathophysiology of gallbladder dysfunction was discussed.  Natural history risks without surgery was discussed.   I feel the risks of no intervention will lead to serious problems that outweigh the operative risks; therefore, I recommended cholecystectomy to remove the pathology.  I explained laparoscopic techniques with possible need for an open approach.  Probable cholangiogram to evaluate the bilary tract was explained as well.    Risks such as bleeding, infection,  abscess, leak, injury to other organs, need for further treatment, heart attack, death, and other risks were discussed.  I noted a good likelihood this will help address the problem.  Possibility that this will not correct all abdominal symptoms was explained.  Goals of post-operative recovery were discussed as well.  We will work to minimize complications.  An educational handout further explaining the pathology and treatment options was given as well.  Questions were answered.  The patient expresses understanding & wishes to proceed with surgery.  OR FINDINGS: Distended large stretched out gallbladder with chronic scarring and gallbladder wall thickening changes and adhesions consistent with chronic cholecystitis.  Cholangiography aborted due to very fragile narrow cystic duct Critical view.  Suspicion for choledocholithiasis low preoperatively  Liver: normal  DESCRIPTION:   The patient was identified & brought in the operating room. The patient was positioned supine with arms tucked. SCDs were active during the entire case. The patient underwent general anesthesia without any difficulty.  The abdomen was prepped and draped in a sterile fashion. A Surgical Timeout confirmed our plan.  I made a transverse curvilinear incision through the superior umbilical fold.  I placed a 5mm long port through the supraumbilical fascia using a modified Hassan cutdown technique with umbilical stalk fascial countertraction. I began carbon dioxide insufflation.  No change in end tidal CO2 measurement.   Camera inspection revealed no injury. There were no adhesions to the anterior abdominal wall supraumbilically.  I proceeded to continue with single site technique. I placed a #5 port in left upper aspect of the wound. I placed a 5 mm atraumatic grasper in the right inferior aspect of the wound.  I turned attention to the right upper quadrant.  Gallbladder was quite boggy with  white discoloration and thickening  consistent with chronic cholecystitis.  The gallbladder fundus was elevated cephalad. I freed adhesions to the ventral surface of the gallbladder off carefully.  He has a moderately dense but thin greater omental and obesity: Adhesions to the ventral surface of the gallbladder that were carefully freed off.  I freed the peritoneal coverings between the gallbladder and the liver on the posteriolateral and anteriomedial walls. I alternated between Harmonic & blunt Maryland dissection to help get a good critical view of the cystic artery and cystic duct.  did further dissection to free 80%of the gallbladder off the liver bed to get a good critical view of the infundibulum and cystic duct. I dissected out the cystic artery; and, after getting a good 360 view, ligated the anterior & posterior branches of the cystic artery close on the infundibulum using the Harmonic ultrasonic dissection.  I skeletonized the cystic duct.  It was extremely thin and fragile.  I decided to hold off on cholangiogram.  I freed the gallbladder from its last few attachments to the liver.  I ligated the cystic duct by using a 0 PDS to the last set around the gallbladder and come proximal to the infundibulum and ligate the cystic duct near its base.  I placed clips on the cystic duct x4.  I completed cystic duct transection. I freed the gallbladder from its remaining attachments to the liver. I ensured hemostasis on the gallbladder fossa of the liver and elsewhere. I inspected the rest of the abdomen & detected no injury nor bleeding elsewhere.  I placed the gallbladder inside and eco-sac given the very thin fragile tissues.  Removed it removed the gallbladder out the supraumbilical fascia.  There were thickened and boggy with a resulting 25 mm fascial defect to remove it safely..  I closed the fascia transversely using #1 PDS interrupted stitches. I closed the skin using 4-0 monocryl stitch.  Sterile dressing was applied. The patient was  extubated & arrived in the PACU in stable condition..  I had discussed postoperative care with the patient and her nephew in the holding area.  Called and left a message with her nephew.  His request.  [phone].  It went to voicemail.  Left a message.  We will watch the patient overnight.   Ardeth SportsmanSteven C. Truxton Stupka, M.D., F.A.C.S. Gastrointestinal and Minimally Invasive Surgery [facility] [address] [place], [place] [id number] [phone] Main / Paging  10/22/2018 11:12 AM

## 2018-10-22 NOTE — Interval H&P Note (Signed)
History and Physical Interval Note:  10/22/2018 9:21 AM  [name]  has presented today for surgery, with the diagnosis of Symptomatic biliary colic, probable chronic cholecystitis  The various methods of treatment have been discussed with the patient and family. After consideration of risks, benefits and other options for treatment, the patient has consented to  Procedure(s): LAPAROSCOPIC CHOLECYSTECTOMY SINGLE SITE WITH INTRAOPERATIVE CHOLANGIOGRAM (N/A) as a surgical intervention .  The patient's history has been reviewed, patient examined, no change in status, stable for surgery.  I have reviewed the patient's chart and labs.  Questions were answered to the patient's satisfaction.    HypoK noted & corrected  I have re-reviewed the the patient's records, history, medications, and allergies.  I have re-examined the patient.  I again discussed intraoperative plans and goals of post-operative recovery.  The patient agrees to proceed.  [name]  Sep 03, 1941 [id number]  Patient Care Team: [name], MD as PCP - General (Internal Medicine) Artis Delay, MD as Consulting Physician (Hematology and Oncology) Karie Soda, MD as Consulting Physician (General Surgery) Elvis Coil, MD as Consulting Physician (Nephrology) Runell Gess, MD as Consulting Physician (Cardiology)  Patient Active Problem List   Diagnosis Date Noted  . Idiopathic thrombocytopenic purpura (HCC) 08/11/2013    Priority: Medium  . Preoperative clearance 09/08/2018  . Chronic constipation 11/01/2016  . CKD (chronic kidney disease) stage 3, GFR 30-59 ml/min (HCC) 08/31/2016  . Chronic pain syndrome 08/31/2016  . Neuropathy 08/31/2016  . Arthritis 05/15/2016  . Chest pain 05/14/2016  . Hypokalemia 11/15/2013  . Dysuria 10/14/2013  . Anemia of chronic disease 09/13/2013  . Family history of malignant neoplasm of gastrointestinal tract 09/13/2013  . Fatigue 08/02/2013  . COLONIC POLYPS, HX OF 06/19/2010  .  COUGH 04/06/2010  . ANEMIA 04/02/2010  . GASTROENTERITIS, HX OF 12/18/2008  . PULMONARY NODULE, LEFT LOWER LOBE 09/02/2007  . GASTROESOPHAGEAL REFLUX DISEASE 09/02/2007  . IRRITABLE BOWEL SYNDROME 09/02/2007  . ANXIETY DISORDER, GENERALIZED 07/14/2007  . Hypothyroidism 07/09/2007  . DYSLIPIDEMIA 07/09/2007  . HYPERCALCEMIA 07/09/2007  . Essential hypertension 07/09/2007  . ALLERGIC RHINITIS 07/09/2007  . BRONCHIECTASIS 07/09/2007  . RENAL INSUFFICIENCY 07/09/2007  . OSTEOARTHRITIS, KNEES, BILATERAL 07/09/2007  . Backache 07/09/2007  . GRAVE'S DISEASE, HX OF 07/09/2007  . HEART MURMUR, HX OF 07/09/2007    Past Medical History:  Diagnosis Date  . Allergic rhinitis   . Anxiety   . Back pain   . Bronchiectasis   . Dyslipidemia   . Dysuria 10/14/2013  . Fatigue 08/02/2013  . GERD (gastroesophageal reflux disease)    REPORTS BECAUSE OF THIS SHE HAS TO SLEEP WITH HEAD ELEVATED   . History of Graves' disease   . Hx of colonic polyp   . Hypercalcemia   . Hyperlipidemia   . Hypertension   . Hypokalemia 11/15/2013  . 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 Murmur, heart   . Osteoarthritis, knee   . Renal insufficiency   . Shingles 2018  . Thoracic aortic aneurysm (HCC)   . Thrombocytopenia, unspecified (HCC) 08/02/2013   REPORTS AFTER 2012 , SHE WAS TOLD HER PLATELETS WERE VERY LOW AND WAS MADE TO STAY IN THE HOSPITAL FOR 3 DAYS WITH  . Thrush 10/14/2013  . Thrush, oral 09/02/2013  . Thyroid disease   . UTI (lower urinary tract infection) 10/14/2013    Past Surgical History:  Procedure Laterality Date  . ABDOMINAL HYSTERECTOMY     b/c fibroids  . APPENDECTOMY    . KNEE  ARTHROPLASTY Right 10/2007  . TOTAL KNEE ARTHROPLASTY Bilateral    2008, 2012    Social History   Socioeconomic History  . Marital status: Single    Spouse name: Not on file  . Number of children: Not on file  . Years of education: Not on file  . Highest education level: Not on file   Occupational History  . Not on file  Social Needs  . Financial resource strain: Not on file  . Food insecurity:    Worry: Not on file    Inability: Not on file  . Transportation needs:    Medical: Not on file    Non-medical: Not on file  Tobacco Use  . Smoking status: Former Smoker    Packs/day: 1.00    Years: 10.00    Pack years: 10.00    Types: Cigarettes    Last attempt to quit: 11/11/1976    Years since quitting: 41.9  . Smokeless tobacco: Never Used  Substance and Sexual Activity  . Alcohol use: No  . Drug use: No  . Sexual activity: Not on file  Lifestyle  . Physical activity:    Days per week: Not on file    Minutes per session: Not on file  . Stress: Not on file  Relationships  . Social connections:    Talks on phone: Not on file    Gets together: Not on file    Attends religious service: Not on file    Active member of club or organization: Not on file    Attends meetings of clubs or organizations: Not on file    Relationship status: Not on file  . Intimate partner violence:    Fear of current or ex partner: Not on file    Emotionally abused: Not on file    Physically abused: Not on file    Forced sexual activity: Not on file  Other Topics Concern  . Not on file  Social History Narrative  . Not on file    Family History  Problem Relation Age of Onset  . Kidney cancer Brother [id number]  . Prostate cancer Brother   . Colon cancer Brother   . Breast cancer Maternal Aunt   . Liver cancer Maternal Uncle   . Colon polyps Brother   . Heart disease Mother   . Kidney disease Mother        on dialysis  . Irritable bowel syndrome Maternal Aunt     Medications Prior to Admission  Medication Sig Dispense Refill Last Dose  . amLODipine (NORVASC) 10 MG tablet Take 10 mg by mouth 2 (two) times daily.   4 10/22/2018 at 0600  . citalopram (CELEXA) 20 MG tablet Take 20 mg by mouth daily.   10/22/2018 at 0600  . clotrimazole (LOTRIMIN) 1 % cream Apply 1 application  topically daily as needed (for irritation).     Marland Kitchen. ergocalciferol (VITAMIN D2) 1.25 MG (50000 UT) capsule Take 50,000 Units by mouth every Friday.     . furosemide (LASIX) 20 MG tablet Take 20 mg by mouth daily.    Taking  . levothyroxine (SYNTHROID, LEVOTHROID) 125 MCG tablet Take 125 mcg by mouth daily before breakfast.    10/22/2018 at 0600  . loratadine (CLARITIN) 10 MG tablet TAKE 1 TABLET (10 MG TOTAL) BY MOUTH DAILY. (Patient taking differently: Take 10 mg by mouth daily. ) 90 tablet 1 10/22/2018 at 0600  . losartan (COZAAR) 100 MG tablet Take 100 mg by mouth daily.  10/21/2018 at Unknown time  . pantoprazole (PROTONIX) 40 MG tablet TAKE 1 TABLET BY MOUTH DAILY (Patient taking differently: Take 40 mg by mouth daily. ) 30 tablet 0 10/21/2018 at Unknown time  . potassium chloride SA (K-DUR,KLOR-CON) 20 MEQ tablet Take 1 tablet (20 mEq total) by mouth 2 (two) times daily. 20 tablet 0 10/21/2018 at Unknown time  . acetaminophen (TYLENOL) 500 MG tablet Take 500 mg by mouth every 6 (six) hours as needed.   Taking  . pregabalin (LYRICA) 100 MG capsule Take 100 mg by mouth 2 (two) times daily.    Unknown at Unknown time  . Respiratory Therapy Supplies (FLUTTER) DEVI Use as directed (Patient not taking: Reported on 10/14/2018) 1 each 0 Not Taking at Unknown time    Current Facility-Administered Medications  Medication Dose Route Frequency Provider Last Rate Last Dose  . bupivacaine liposome (EXPAREL) 1.3 % injection 266 mg  20 mL Infiltration On Call to OR Karie Soda, MD      . Chlorhexidine Gluconate Cloth 2 % PADS 6 each  6 each Topical Once Karie Soda, MD       And  . Chlorhexidine Gluconate Cloth 2 % PADS 6 each  6 each Topical Once Karie Soda, MD      . lactated ringers infusion   Intravenous Continuous Elmer Picker, MD 20 mL/hr at 10/22/18 0839       Allergies  Allergen Reactions  . Cauliflower [Brassica Oleracea Italica] Other (See Comments)    Showed up on allergy test   . Codeine Nausea And Vomiting  . Fish Allergy Nausea Only  . Lactose Intolerance (Gi) Diarrhea  . Morphine And Related Nausea And Vomiting  . Oxycodone Nausea And Vomiting  . Penicillins Nausea Only and Other (See Comments)    REACTION: headache, tremors, Nausea Has patient had a PCN reaction causing immediate rash, facial/tongue/throat swelling, SOB or lightheadedness with hypotension: No Has patient had a PCN reaction causing severe rash involving mucus membranes or skin necrosis: No Has patient had a PCN reaction that required hospitalization No Has patient had a PCN reaction occurring within the last 10 years: No If all of the above answers are "NO", then may proceed with Cephalosporin use.   . Sulfonamide Derivatives Swelling and Other (See Comments)    REACTION: mouth swells  . Tramadol Hcl Other (See Comments)    REACTION: GI Intolerance  . Triamcinolone Swelling and Other (See Comments)    REACTION: angio edema    BP (!) 187/93   Temp 98.2 F (36.8 C) (Oral)   Resp 13   Ht 5' 5.5" (1.664 m)   Wt 90.7 kg   SpO2 98%   BMI 32.78 kg/m   Labs: Results for orders placed or performed during the hospital encounter of 10/22/18 (from the past 48 hour(s))  Potassium     Status: None   Collection Time: 10/22/18  8:33 AM  Result Value Ref Range   Potassium 3.9 3.5 - 5.1 mmol/L    Comment: Performed at [facility], [address] [place], [place] [address]    Imaging / Studies: No results found.   [name], M.D., F.A.C.S. Gastrointestinal and Minimally Invasive Surgery [facility] [address] [place], [place] [address] [phone] Main / Paging  10/22/2018 9:21 AM    [name]

## 2018-10-22 NOTE — Anesthesia Postprocedure Evaluation (Signed)
Anesthesia Post Note  Patient: Kandis FantasiaGloria Rawdon  Procedure(s) Performed: LAPAROSCOPIC CHOLECYSTECTOMY SINGLE SITE (N/A Abdomen)     Patient location during evaluation: PACU Anesthesia Type: General Level of consciousness: awake and alert Pain management: pain level controlled Vital Signs Assessment: post-procedure vital signs reviewed and stable Respiratory status: spontaneous breathing, nonlabored ventilation, respiratory function stable and patient connected to nasal cannula oxygen Cardiovascular status: blood pressure returned to baseline and stable Postop Assessment: no apparent nausea or vomiting Anesthetic complications: no    Last Vitals:  Vitals:   10/22/18 1145 10/22/18 1200  BP: 120/68 114/72  Pulse: 95 96  Resp: 18 20  Temp:    SpO2: 100% 100%    Last Pain:  Vitals:   10/22/18 1200  TempSrc:   PainSc: 0-No pain                 Chelsey L Woodrum

## 2018-10-22 NOTE — Anesthesia Procedure Notes (Signed)
Procedure Name: Intubation Date/Time: 10/22/2018 10:18 AM Performed by: Lavina Hamman, CRNA Pre-anesthesia Checklist: Patient identified, Emergency Drugs available, Suction available, Patient being monitored and Timeout performed Patient Re-evaluated:Patient Re-evaluated prior to induction Oxygen Delivery Method: Circle system utilized Preoxygenation: Pre-oxygenation with 100% oxygen Induction Type: IV induction Ventilation: Mask ventilation without difficulty Laryngoscope Size: Mac and 3 Grade View: Grade I Tube type: Oral Tube size: 7.0 mm Number of attempts: 1 Airway Equipment and Method: Stylet Placement Confirmation: ETT inserted through vocal cords under direct vision,  positive ETCO2,  CO2 detector and breath sounds checked- equal and bilateral Secured at: 20 cm Tube secured with: Tape Dental Injury: Teeth and Oropharynx as per pre-operative assessment

## 2018-10-22 NOTE — Anesthesia Preprocedure Evaluation (Addendum)
Anesthesia Evaluation  Patient identified by MRN, date of birth, ID band Patient awake    Reviewed: Allergy & Precautions, NPO status , Patient's Chart, lab work & pertinent test results  Airway Mallampati: II  TM Distance: >3 FB Neck ROM: Full  Mouth opening: Limited Mouth Opening  Dental no notable dental hx. (+) Edentulous Upper, Edentulous Lower   Pulmonary neg pulmonary ROS, former smoker,    Pulmonary exam normal breath sounds clear to auscultation       Cardiovascular hypertension, negative cardio ROS Normal cardiovascular exam Rhythm:Regular Rate:Normal  Thoracic aortic aneurysm  Stress Test 2012 unremarkable TTE 2007 EF 55%, no significant valvular abnormalities   Neuro/Psych PSYCHIATRIC DISORDERS Anxiety negative neurological ROS     GI/Hepatic Neg liver ROS, GERD  ,  Endo/Other  Hypothyroidism   Renal/GU Renal InsufficiencyRenal disease  negative genitourinary   Musculoskeletal  (+) Arthritis , Osteoarthritis,    Abdominal   Peds  Hematology negative hematology ROS (+) anemia , ITP   Anesthesia Other Findings   Reproductive/Obstetrics                            Anesthesia Physical Anesthesia Plan  ASA: III  Anesthesia Plan: General   Post-op Pain Management:    Induction: Intravenous  PONV Risk Score and Plan: 3 and Treatment may vary due to age or medical condition, Dexamethasone and Ondansetron  Airway Management Planned: Oral ETT  Additional Equipment:   Intra-op Plan:   Post-operative Plan: Extubation in OR  Informed Consent: I have reviewed the patients History and Physical, chart, labs and discussed the procedure including the risks, benefits and alternatives for the proposed anesthesia with the patient or authorized representative who has indicated his/her understanding and acceptance.   Dental advisory given  Plan Discussed with: CRNA  Anesthesia Plan  Comments:         Anesthesia Quick Evaluation

## 2018-10-22 NOTE — Plan of Care (Signed)
Plan of care 

## 2018-10-23 ENCOUNTER — Encounter (HOSPITAL_COMMUNITY): Payer: Self-pay | Admitting: Surgery

## 2018-10-23 DIAGNOSIS — Z888 Allergy status to other drugs, medicaments and biological substances status: Secondary | ICD-10-CM | POA: Diagnosis not present

## 2018-10-23 DIAGNOSIS — M6281 Muscle weakness (generalized): Secondary | ICD-10-CM | POA: Diagnosis not present

## 2018-10-23 DIAGNOSIS — A419 Sepsis, unspecified organism: Secondary | ICD-10-CM | POA: Diagnosis not present

## 2018-10-23 DIAGNOSIS — M170 Bilateral primary osteoarthritis of knee: Secondary | ICD-10-CM | POA: Diagnosis not present

## 2018-10-23 DIAGNOSIS — E039 Hypothyroidism, unspecified: Secondary | ICD-10-CM | POA: Diagnosis not present

## 2018-10-23 DIAGNOSIS — D649 Anemia, unspecified: Secondary | ICD-10-CM | POA: Diagnosis not present

## 2018-10-23 DIAGNOSIS — K219 Gastro-esophageal reflux disease without esophagitis: Secondary | ICD-10-CM | POA: Diagnosis not present

## 2018-10-23 DIAGNOSIS — N183 Chronic kidney disease, stage 3 (moderate): Secondary | ICD-10-CM | POA: Diagnosis not present

## 2018-10-23 DIAGNOSIS — G894 Chronic pain syndrome: Secondary | ICD-10-CM | POA: Diagnosis not present

## 2018-10-23 DIAGNOSIS — Z9049 Acquired absence of other specified parts of digestive tract: Secondary | ICD-10-CM | POA: Diagnosis not present

## 2018-10-23 DIAGNOSIS — I129 Hypertensive chronic kidney disease with stage 1 through stage 4 chronic kidney disease, or unspecified chronic kidney disease: Secondary | ICD-10-CM | POA: Diagnosis not present

## 2018-10-23 DIAGNOSIS — G9340 Encephalopathy, unspecified: Secondary | ICD-10-CM | POA: Diagnosis not present

## 2018-10-23 DIAGNOSIS — Z885 Allergy status to narcotic agent status: Secondary | ICD-10-CM | POA: Diagnosis not present

## 2018-10-23 DIAGNOSIS — I6349 Cerebral infarction due to embolism of other cerebral artery: Secondary | ICD-10-CM | POA: Diagnosis not present

## 2018-10-23 DIAGNOSIS — E876 Hypokalemia: Secondary | ICD-10-CM | POA: Diagnosis not present

## 2018-10-23 DIAGNOSIS — Z79899 Other long term (current) drug therapy: Secondary | ICD-10-CM | POA: Diagnosis not present

## 2018-10-23 DIAGNOSIS — Z87891 Personal history of nicotine dependence: Secondary | ICD-10-CM | POA: Diagnosis not present

## 2018-10-23 DIAGNOSIS — R269 Unspecified abnormalities of gait and mobility: Secondary | ICD-10-CM | POA: Diagnosis not present

## 2018-10-23 DIAGNOSIS — L89623 Pressure ulcer of left heel, stage 3: Secondary | ICD-10-CM | POA: Diagnosis not present

## 2018-10-23 DIAGNOSIS — K581 Irritable bowel syndrome with constipation: Secondary | ICD-10-CM | POA: Diagnosis not present

## 2018-10-23 DIAGNOSIS — R293 Abnormal posture: Secondary | ICD-10-CM | POA: Diagnosis not present

## 2018-10-23 DIAGNOSIS — K8010 Calculus of gallbladder with chronic cholecystitis without obstruction: Secondary | ICD-10-CM | POA: Diagnosis not present

## 2018-10-23 DIAGNOSIS — R279 Unspecified lack of coordination: Secondary | ICD-10-CM | POA: Diagnosis not present

## 2018-10-23 DIAGNOSIS — I712 Thoracic aortic aneurysm, without rupture: Secondary | ICD-10-CM | POA: Diagnosis not present

## 2018-10-23 DIAGNOSIS — R2681 Unsteadiness on feet: Secondary | ICD-10-CM | POA: Diagnosis not present

## 2018-10-23 DIAGNOSIS — G308 Other Alzheimer's disease: Secondary | ICD-10-CM | POA: Diagnosis not present

## 2018-10-23 DIAGNOSIS — I10 Essential (primary) hypertension: Secondary | ICD-10-CM | POA: Diagnosis not present

## 2018-10-23 DIAGNOSIS — D693 Immune thrombocytopenic purpura: Secondary | ICD-10-CM | POA: Diagnosis not present

## 2018-10-23 DIAGNOSIS — E7800 Pure hypercholesterolemia, unspecified: Secondary | ICD-10-CM | POA: Diagnosis not present

## 2018-10-23 DIAGNOSIS — Z743 Need for continuous supervision: Secondary | ICD-10-CM | POA: Diagnosis not present

## 2018-10-23 DIAGNOSIS — K5900 Constipation, unspecified: Secondary | ICD-10-CM | POA: Diagnosis not present

## 2018-10-23 DIAGNOSIS — Z882 Allergy status to sulfonamides status: Secondary | ICD-10-CM | POA: Diagnosis not present

## 2018-10-23 DIAGNOSIS — M1991 Primary osteoarthritis, unspecified site: Secondary | ICD-10-CM | POA: Diagnosis not present

## 2018-10-23 LAB — CREATININE, SERUM
Creatinine, Ser: 1.28 mg/dL — ABNORMAL HIGH (ref 0.44–1.00)
GFR calc Af Amer: 46 mL/min — ABNORMAL LOW (ref >60)
GFR calc non Af Amer: 40 mL/min — ABNORMAL LOW (ref >60)

## 2018-10-23 LAB — POTASSIUM: Potassium: 4.3 mmol/L (ref 3.5–5.1)

## 2018-10-23 NOTE — Progress Notes (Signed)
[name] FantasiaGloria Gould [id number] 1940/11/18  CARE TEAM:  PCP: Pearson GrippeKim, James, MD  Outpatient Care Team: Patient Care Team: Pearson GrippeKim, James, MD as PCP - General (Internal Medicine) Artis DelayGorsuch, Ni, MD as Consulting Physician (Hematology and Oncology) Karie SodaGross, Deloyce Walthers, MD as Consulting Physician (General Surgery) Elvis CoilWebb, Martin, MD as Consulting Physician (Nephrology) Runell GessBerry, Jonathan J, MD as Consulting Physician (Cardiology)  Inpatient Treatment Team: Treatment Team: Attending Provider: Karie SodaGross, Mitchell Iwanicki, MD   Problem List:   Principal Problem:   Chronic calculus cholecystitis s/p lap cholecystectomy 10/22/2018 Active Problems:   Hypothyroidism   Essential hypertension   Osteoarthrosis involving lower leg   Anemia of chronic disease   Hypokalemia   CKD (chronic kidney disease) stage 3, GFR 30-59 ml/min (HCC)   Chronic pain syndrome   1 Day Post-Op  10/22/2018  Procedure(s): LAPAROSCOPIC CHOLECYSTECTOMY SINGLE SITE    Assessment  Recovering  [facility](Hospital Stay = 0 days)  Plan:  -Okay to discharge from hospital.  The challenges is that her son is not available to help take care of her as initially anticipated.  Neither is her nephew.  She has no other family lives by herself.  She is concerned about safety at home.  She requests some help.  We will see if we can arrange a short course at a skilled nursing facility until she is more independent and/or family is available.  VTE prophylaxis- SCDs, etc  Mobilize as tolerated to help recovery  30 minutes spent in review, evaluation, examination, counseling, and coordination of care.  More than 50% of that time was spent in counseling.  10/23/2018    Subjective: (Chief complaint)  Sitting up.  Tolerating p.o.  No nausea or vomiting.  Mild soreness at bellybutton but nothing too severe.  Worried that her son is no longer available to help take care of her this weekend.  Objective:  Vital signs:  Vitals:   10/22/18 1719 10/22/18  2122 10/23/18 0117 10/23/18 0438  BP: 131/77 118/81 139/76 122/77  Pulse: (!) 102 92 92 83  Resp: 15 18 17 17   Temp: 97.6 F (36.4 C) 98.4 F (36.9 C) 98.9 F (37.2 C) 98.2 F (36.8 C)  TempSrc: Oral Oral Oral Oral  SpO2: 94% 99% 98% 97%  Weight:      Height:        Last BM Date: 10/22/18  Intake/Output   Yesterday:  12/12 0701 - 12/13 0700 In: 1777.1 [P.O.:765; I.V.:762.1; IV Piggyback:250] Out: 615 [Urine:600; Blood:15] This shift:  No intake/output data recorded.  Bowel function:  Flatus: YES  BM:  No  Drain: (No drain)   Physical Exam:  General: Pt awake/alert/oriented x4 in no acute distress Eyes: PERRL, normal EOM.  Sclera clear.  No icterus Neuro: CN II-XII intact w/o focal sensory/motor deficits. Lymph: No head/neck/groin lymphadenopathy Psych:  No delerium/psychosis/paranoia HENT: Normocephalic, Mucus membranes moist.  No thrush Neck: Supple, No tracheal deviation Chest:  No chest wall pain w good excursion CV:  Pulses intact.  Regular rhythm MS: Normal AROM mjr joints.  No obvious deformity  Abdomen: Soft.  Nondistended.  Mildly tender at incisions only.  No evidence of peritonitis.  No incarcerated hernias.  Ext:   No deformity.  No mjr edema.  No cyanosis Skin: No petechiae / purpura  Results:   Labs: Results for orders placed or performed during the hospital encounter of 10/22/18 (from the past 48 hour(s))  Potassium     Status: None   Collection Time: 10/22/18  8:33 AM  Result Value Ref  Range   Potassium 3.9 3.5 - 5.1 mmol/L    Comment: Performed at [facility], [address] [place], [place] [address]  Potassium     Status: None   Collection Time: 10/23/18  4:12 AM  Result Value Ref Range   Potassium 4.3 3.5 - 5.1 mmol/L    Comment: Performed at [facility], [address] [place], [place] [address]  Creatinine, serum     Status: Abnormal   Collection Time: 10/23/18  4:12 AM  Result Value Ref  Range   Creatinine, Ser 1.28 (H) 0.44 - 1.00 mg/dL   GFR calc non Af Amer 40 (L) >60 mL/min   GFR calc Af Amer 46 (L) >60 mL/min    Comment: Performed at [facility] [address] [place], [place] [address]    Imaging / Studies: No results found.  Medications / Allergies: per chart  Antibiotics: Anti-infectives (From admission, onward)   None        Note: Portions of this report may have been transcribed using voice recognition software. Every effort was made to ensure accuracy; however, inadvertent computerized transcription errors may be present.   Any transcriptional errors that result from this process are unintentional.     [name], MD, FACS, MASCRS Gastrointestinal and Minimally Invasive Surgery    [address], [address] [place], [place] [address] [phone] Main / Paging [phone] Fax

## 2018-10-23 NOTE — Progress Notes (Signed)
Patient would like to go to rehab for a week or two.  Patient lives alone and is a high fall risk. [facility] is [facility].

## 2018-10-23 NOTE — Clinical Social Work Placement (Addendum)
D/C Summary sent Nurse call report to [phone] Room 105 [facility] called to transport.   CLINICAL SOCIAL WORK PLACEMENT  NOTE  Date:  10/23/2018  Patient Details  Name: [name] FantasiaGloria Derenzo MRN: [id number] Date of Birth: May 22, 1941  Clinical Social Work is seeking post-discharge placement for this patient at the Skilled  Nursing Facility level of care (*CSW will initial, date and re-position this form in  chart as items are completed):  Yes   Patient/family provided with [facility] Clinical Social Work Department's list of facilities offering this level of care within the geographic area requested by the patient (or if unable, by the patient's family).  Yes   Patient/family informed of their freedom to choose among providers that offer the needed level of care, that participate in Medicare, Medicaid or managed care program needed by the patient, have an available bed and are willing to accept the patient.  Yes   Patient/family informed of [facility] ownership interest in [facility] and [facility], as well as of the fact that they are under no obligation to receive care at these facilities.  PASRR submitted to EDS on       PASRR number received on       Existing PASRR number confirmed on 10/23/18     FL2 transmitted to all facilities in geographic area requested by pt/family on       FL2 transmitted to all facilities within larger geographic area on 10/23/18     Patient informed that his/her managed care company has contracts with or will negotiate with certain facilities, including the following:        Yes   Patient/family informed of bed offers received.  Patient chooses bed at       Physician recommends and patient chooses bed at      Patient to be transferred to   on 10/23/18.  Patient to be transferred to facility by [facility]      Patient family notified on 10/23/18 of transfer.  Name of family member notified: Lanier EnsignNephew Lindsey   PHYSICIAN       Additional  Comment:    _______________________________________________ Clearance CootsNicole A Alexiz Cothran, LCSW 10/23/2018, 3:36 PM

## 2018-10-23 NOTE — Progress Notes (Signed)
CSW following for discharge needs to SNF. The patient will need to evaluated by physical therapy. Nurse consulted PT. SNF will then start the insurance approval process.   [name], [name], MSW Clinical Social Worker  [phone] 10/23/2018  11:33 AM

## 2018-10-23 NOTE — Plan of Care (Signed)
  Problem: Clinical Measurements: Goal: Will remain free from infection Outcome: Progressing   Problem: Activity: Goal: Risk for activity intolerance will decrease Outcome: Progressing   Problem: Elimination: Goal: Will not experience complications related to urinary retention Outcome: Progressing   

## 2018-10-23 NOTE — [place] FL2 (Signed)
[place] MEDICAID FL2 LEVEL OF CARE SCREENING TOOL     IDENTIFICATION  Patient Name: [name] FantasiaGloria Gould Birthdate: 1940-12-04 Sex: female Admission Date (Current Location): 10/22/2018  [facility] [place]   Facility and Address:  [facility],  [address], [place] [id number]      Provider Number: [id number]  Attending Physician Name and Address:  Karie SodaGross, Steven, MD  Relative Name and Phone Number:       Current Level of Care: Hospital Recommended Level of Care: Skilled Nursing Facility Prior Approval Number:    Date Approved/Denied:   PASRR Number:   [id number]   Discharge Plan: SNF    Current Diagnoses: Patient Active Problem List   Diagnosis Date Noted  . Chronic calculus cholecystitis s/p lap cholecystectomy 10/22/2018 10/22/2018  . Preoperative clearance 09/08/2018  . Chronic constipation 11/01/2016  . CKD (chronic kidney disease) stage 3, GFR 30-59 ml/min (HCC) 08/31/2016  . Chronic pain syndrome 08/31/2016  . Neuropathy 08/31/2016  . Arthritis 05/15/2016  . Chest pain 05/14/2016  . Hypokalemia 11/15/2013  . Dysuria 10/14/2013  . Anemia of chronic disease 09/13/2013  . Family history of malignant neoplasm of gastrointestinal tract 09/13/2013  . Idiopathic thrombocytopenic purpura (HCC) 08/11/2013  . Fatigue 08/02/2013  . COLONIC POLYPS, HX OF 06/19/2010  . COUGH 04/06/2010  . ANEMIA 04/02/2010  . GASTROENTERITIS, HX OF 12/18/2008  . PULMONARY NODULE, LEFT LOWER LOBE 09/02/2007  . GASTROESOPHAGEAL REFLUX DISEASE 09/02/2007  . IRRITABLE BOWEL SYNDROME 09/02/2007  . ANXIETY DISORDER, GENERALIZED 07/14/2007  . Hypothyroidism 07/09/2007  . DYSLIPIDEMIA 07/09/2007  . HYPERCALCEMIA 07/09/2007  . Essential hypertension 07/09/2007  . ALLERGIC RHINITIS 07/09/2007  . BRONCHIECTASIS 07/09/2007  . Osteoarthrosis involving lower leg 07/09/2007  . Backache 07/09/2007  . GRAVE'S DISEASE, HX OF 07/09/2007  . HEART MURMUR, HX OF  07/09/2007    Orientation RESPIRATION BLADDER Height & Weight     Self, Time, Situation, Place  Normal Continent Weight: 198 lb 6.6 oz (90 kg) Height:  5\' 5"  (165.1 cm)  BEHAVIORAL SYMPTOMS/MOOD NEUROLOGICAL BOWEL NUTRITION STATUS      Continent Diet  AMBULATORY STATUS COMMUNICATION OF NEEDS Skin   Extensive Assist Verbally Normal                       Personal Care Assistance Level of Assistance  Bathing, Feeding, Dressing Bathing Assistance: Limited assistance Feeding assistance: Independent Dressing Assistance: Limited assistance     Functional Limitations Info  Sight, Hearing, Speech Sight Info: Adequate Hearing Info: Adequate Speech Info: Adequate    SPECIAL CARE FACTORS FREQUENCY  PT (By licensed PT), OT (By licensed OT)     PT Frequency: 5x/week OT Frequency: 5x/week             Contractures Contractures Info: Not present    Additional Factors Info  Code Status, Allergies Code Status Info: Fullcode  Allergies Info: Allergies: Cauliflower Brassica Oleracea Italica, Codeine, Fish Allergy, Lactose Intolerance (Gi), Morphine And Related, Oxycodone, Penicillins, Sulfonamide Derivatives, Tramadol Hcl, Triamcinolone           Current Medications (10/23/2018):  This is the current hospital active medication list Current Facility-Administered Medications  Medication Dose Route Frequency Provider Last Rate Last Dose  . 0.9 %  sodium chloride infusion  250 mL Intravenous PRN Karie SodaGross, Steven, MD   Stopped at 10/22/18 [id number]  . acetaminophen (TYLENOL) suppository 650 mg  650 mg Rectal Q6H PRN Karie SodaGross, Steven, MD      . acetaminophen (TYLENOL)  tablet 325-650 mg  325-650 mg Oral Q6H PRN Karie Soda, MD      . amLODipine (NORVASC) tablet 10 mg  10 mg Oral BID Karie Soda, MD   10 mg at 10/23/18 1058  . bisacodyl (DULCOLAX) suppository 10 mg  10 mg Rectal Daily PRN Karie Soda, MD      . citalopram (CELEXA) tablet 20 mg  20 mg Oral Daily Karie Soda, MD   20 mg  at 10/23/18 1058  . clotrimazole (LOTRIMIN) 1 % cream 1 application  1 application Topical Daily PRN Karie Soda, MD      . diphenhydrAMINE (BENADRYL) 12.5 MG/5ML elixir 12.5 mg  12.5 mg Oral Q6H PRN Karie Soda, MD       Or  . diphenhydrAMINE (BENADRYL) injection 12.5 mg  12.5 mg Intravenous Q6H PRN Karie Soda, MD      . enalaprilat (VASOTEC) injection 0.625-1.25 mg  0.625-1.25 mg Intravenous Q6H PRN Karie Soda, MD      . enoxaparin (LOVENOX) injection 40 mg  40 mg Subcutaneous Q24H Karie Soda, MD   40 mg at 10/23/18 0747  . furosemide (LASIX) tablet 20 mg  20 mg Oral Daily Karie Soda, MD   20 mg at 10/23/18 1058  . gabapentin (NEURONTIN) capsule 300 mg  300 mg Oral BID Karie Soda, MD   300 mg at 10/22/18 2223  . hydrALAZINE (APRESOLINE) injection 5-20 mg  5-20 mg Intravenous Q4H PRN Karie Soda, MD      . HYDROcodone-acetaminophen (NORCO/VICODIN) 5-325 MG per tablet 1-2 tablet  1-2 tablet Oral Q4H PRN Karie Soda, MD   1 tablet at 10/23/18 1150  . HYDROmorphone (DILAUDID) injection 0.5-2 mg  0.5-2 mg Intravenous Q2H PRN Karie Soda, MD      . lactated ringers bolus 1,000 mL  1,000 mL Intravenous TID PRN Karie Soda, MD      . lactated ringers infusion 1,000 mL  1,000 mL Intravenous Q8H PRN Gross, Viviann Spare, MD      . levothyroxine (SYNTHROID, LEVOTHROID) tablet 125 mcg  125 mcg Oral QAC breakfast Karie Soda, MD   125 mcg at 10/23/18 0612  . lip balm (CARMEX) ointment 1 application  1 application Topical BID Karie Soda, MD   1 application at 10/23/18 1101  . loratadine (CLARITIN) tablet 10 mg  10 mg Oral Daily Karie Soda, MD   10 mg at 10/23/18 1058  . losartan (COZAAR) tablet 100 mg  100 mg Oral Daily Karie Soda, MD   100 mg at 10/23/18 1058  . magic mouthwash  15 mL Oral QID PRN Karie Soda, MD      . methocarbamol (ROBAXIN) tablet 750 mg  750 mg Oral Q6H PRN Karie Soda, MD   750 mg at 10/23/18 1149  . metoprolol tartrate (LOPRESSOR) injection 5 mg  5  mg Intravenous Q6H PRN Karie Soda, MD      . ondansetron (ZOFRAN-ODT) disintegrating tablet 4 mg  4 mg Oral Q6H PRN Karie Soda, MD       Or  . ondansetron (ZOFRAN) injection 4 mg  4 mg Intravenous Q6H PRN Karie Soda, MD      . pantoprazole (PROTONIX) EC tablet 40 mg  40 mg Oral Daily Karie Soda, MD   40 mg at 10/23/18 1058  . polyethylene glycol (MIRALAX / GLYCOLAX) packet 17 g  17 g Oral Daily PRN Karie Soda, MD      . potassium chloride SA (K-DUR,KLOR-CON) CR tablet 20 mEq  20 mEq Oral BID Karie Soda,  MD   20 mEq at 10/23/18 1058  . pregabalin (LYRICA) capsule 100 mg  100 mg Oral BID Karie Soda, MD   100 mg at 10/23/18 1101  . prochlorperazine (COMPAZINE) tablet 10 mg  10 mg Oral Q6H PRN Karie Soda, MD       Or  . prochlorperazine (COMPAZINE) injection 5-10 mg  5-10 mg Intravenous Q6H PRN Karie Soda, MD      . simethicone (MYLICON) chewable tablet 40 mg  40 mg Oral Q6H PRN Karie Soda, MD      . sodium chloride flush (NS) 0.9 % injection 3 mL  3 mL Intravenous Catha Gosselin, MD   3 mL at 10/22/18 2233  . sodium chloride flush (NS) 0.9 % injection 3 mL  3 mL Intravenous PRN Karie Soda, MD      . zolpidem (AMBIEN) tablet 5 mg  5 mg Oral QHS PRN Karie Soda, MD         Discharge Medications: Please see discharge summary for a list of discharge medications.  Relevant Imaging Results:  Relevant Lab Results:   Additional Information [id number]  Clearance Coots, LCSW

## 2018-10-23 NOTE — Progress Notes (Signed)
[facility] called to colin scott RN

## 2018-10-23 NOTE — Discharge Summary (Signed)
Physician Discharge Summary    Patient ID: [name] MRN: [id number] DOB/AGE: November 14, 1940  77 y.o.  Patient Care Team: [name], MD as PCP - General (Internal Medicine) Artis Delay, MD as Consulting Physician (Hematology and Oncology) Karie Soda, MD as Consulting Physician (General Surgery) Elvis Coil, MD as Consulting Physician (Nephrology) Runell Gess, MD as Consulting Physician (Cardiology)  Admit date: 10/22/2018  Discharge date: 10/23/2018  Hospital Stay = 0 days    Discharge Diagnoses:  Principal Problem:   Chronic calculus cholecystitis s/p lap cholecystectomy 10/22/2018 Active Problems:   Hypothyroidism   Essential hypertension   Osteoarthrosis involving lower leg   Anemia of chronic disease   Hypokalemia   CKD (chronic kidney disease) stage 3, GFR 30-59 ml/min (HCC)   Chronic pain syndrome   1 Day Post-Op  10/22/2018  POST-OPERATIVE DIAGNOSIS:   Symptomatic biliary colic, probable chronic cholecystitis  SURGERY:  10/22/2018  Procedure(s): LAPAROSCOPIC CHOLECYSTECTOMY SINGLE SITE  SURGEON:    Surgeon(s): Karie Soda, MD  Consults: None  Hospital Course:   Patient with worsening biliary colic.  Underwent cholecystectomy.  Postoperatively, the patient gradually mobilized and advanced to a solid diet.  Pain and other symptoms were treated aggressively.    By the time of discharge, the patient was walking with a walker in the room, eating food, having flatus.  She had had significant hypokalemia preoperatively but this was corrected preoperatively and immediate and postop potassium level in normal range.  Reassuring.  Her pain was well-controlled on an oral medications.  Based on meeting discharge criteria and continuing to recover, I felt it was safe for the patient to be discharged from the hospital to further recover with close followup. Postoperative recommendations were discussed in detail.  They are written as well.  Patient son  was to be available to help take care of her that we sent but that is not an option anymore.  Very worried about being by herself at home after surgery.  She request some assistance.  Usually goes to [facility] skilled nursing.  We will see if she can have a short stay until she is more independent and family can help take care of her.  Discharged Condition: good  Discharge Exam: Blood pressure 122/77, pulse 83, temperature 98.2 F (36.8 C), temperature source Oral, resp. rate 17, height 5\' 5"  (1.651 m), weight 90 kg, SpO2 97 %.  General: Pt awake/alert/oriented x4 in No acute distress Eyes: PERRL, normal EOM.  Sclera clear.  No icterus Neuro: CN II-XII intact w/o focal sensory/motor deficits. Lymph: No head/neck/groin lymphadenopathy Psych:  No delerium/psychosis/paranoia HENT: Normocephalic, Mucus membranes moist.  No thrush Neck: Supple, No tracheal deviation Chest: No chest wall pain w good excursion CV:  Pulses intact.  Regular rhythm MS: Normal AROM mjr joints.  No obvious deformity Abdomen: Soft.  Nondistended.  Mildly tender at incisions only.  No evidence of peritonitis.  No incarcerated hernias. Ext:  SCDs BLE.  No mjr edema.  No cyanosis Skin: No petechiae / purpura   Disposition:   Follow-up Information    Karie Soda, MD. Schedule an appointment as soon as possible for a visit in 3 weeks.   Specialty:  General Surgery Why:  To follow up after your operation Contact information: [address] [place] [address] [phone]           Discharge disposition: 03-Skilled Nursing Facility       Discharge Instructions    Call MD for:   Complete  by:  As directed    FEVER >101.5 F (Temperatures <101.64F occasionally happen and are not significant)   Call MD for:   Complete by:  As directed    FEVER > 101.5 F  (temperatures < 101.5 F are not significant)   Call MD for:  extreme fatigue   Complete by:  As directed    Call MD for:  extreme  fatigue   Complete by:  As directed    Call MD for:  persistant dizziness or light-headedness   Complete by:  As directed    Call MD for:  persistant dizziness or light-headedness   Complete by:  As directed    Call MD for:  persistant nausea and vomiting   Complete by:  As directed    Call MD for:  persistant nausea and vomiting   Complete by:  As directed    Call MD for:  redness, tenderness, or signs of infection (pain, swelling, redness, odor or green/yellow discharge around incision site)   Complete by:  As directed    Call MD for:  redness, tenderness, or signs of infection (pain, swelling, redness, odor or green/yellow discharge around incision site)   Complete by:  As directed    Call MD for:  severe uncontrolled pain   Complete by:  As directed    Call MD for:  severe uncontrolled pain   Complete by:  As directed    Diet - low sodium heart healthy   Complete by:  As directed    Follow a light diet the first few days at home.  Start with a bland diet such as soups, liquids, starchy foods, low fat foods, etc.   If you feel full, bloated, or constipated, stay on a full liquid or pureed/blenderized diet for a few days until you feel better and no longer constipated. Gradually get back to a regular solid diet.  Avoid fast food or heavy meals the first week as you are more likely to get nauseated.   Diet - low sodium heart healthy   Complete by:  As directed    Start with a bland diet such as soups, liquids, starchy foods, low fat foods, etc. the first few days at home. Gradually advance to a solid, low-fat, high fiber diet by the end of the first week at home.   Add a fiber supplement to your diet (Metamucil, etc) If you feel full, bloated, or constipated, stay on a full liquid or pureed/blenderized diet for a few days until you feel better and are no longer constipated.   Discharge instructions   Complete by:  As directed    One the day of your discharge from the hospital (or the  next business weekday), please call [facility] to set up or confirm an appointment to see your surgeon in the office for a follow-up appointment.  Usually it is 2-3 weeks after your surgery.  Other concerns If you are not getting better after two weeks or are noticing you are getting worse, contact our office [phone] for further advice.  We may need to adjust your medications, re-evaluate you in the office, send you to the emergency room, or see what other things we can do to help. The clinic staff is available to answer your questions during regular business hours (8:30am-5pm).  Please don't hesitate to call and ask to speak to one of our nurses for clinical concerns.    A surgeon from [facility] is always on  call at the hospitals 24 hours/day If you have a medical emergency, go to the nearest emergency room or call 911.   Discharge instructions   Complete by:  As directed    See Discharge Instructions If you are not getting better after two weeks or are noticing you are getting worse, contact our office [phone] for further advice.  We may need to adjust your medications, re-evaluate you in the office, send you to the emergency room, or see what other things we can do to help. The clinic staff is available to answer your questions during regular business hours (8:30am-5pm).  Please don't hesitate to call and ask to speak to one of our nurses for clinical concerns.    A surgeon from [facility] is always on call at the hospitals 24 hours/day If you have a medical emergency, go to the nearest emergency room or call 911.   Discharge wound care:   Complete by:  As directed    It is good for closed incisions and even open wounds to be washed every day.  Shower every day.  Short baths are fine.  Wash the incisions and wounds clean with soap & water.     You may leave closed incisions open to air if it is dry.   You may cover the incision with clean  gauze & replace it after your daily shower for comfort.   If you have skin tapes (Steristrips) or skin glue (Dermabond) on your incision, leave them in place.  They will fall off on their own like a scab.  You may trim any edges that curl up with clean scissors.    If you have skin staples, set up an appointment for them to be removed in the office in 10 days after surgery.  If you have a drain, wash around the skin exit site with soap & water and place a new dressing of gauze or band aid around the skin every day.  Keep the drain site clean & dry.   Driving Restrictions   Complete by:  As directed    You may drive when you are no longer taking prescription pain medication, you can comfortably wear a seatbelt, and you can safely maneuver your car and apply brakes.   Driving Restrictions   Complete by:  As directed    You may drive when: - you are no longer taking narcotic prescription pain medication - you can comfortably wear a seatbelt - you can safely make sudden turns/stops without pain.   Increase activity slowly   Complete by:  As directed    Increase activity slowly   Complete by:  As directed    Start light daily activities --- self-care, walking, climbing stairs- beginning the day after surgery.  Gradually increase activities as tolerated.  Control your pain to be active.  Stop when you are tired.  Ideally, walk several times a day, eventually an hour a day.   Most people are back to most day-to-day activities in a few weeks.  It takes 4-6 weeks to get back to unrestricted, intense activity. If you can walk 30 minutes without difficulty, it is safe to try more intense activity such as jogging, treadmill, bicycling, low-impact aerobics, swimming, etc. Save the most intensive and strenuous activity for last (Usually 4-8 weeks after surgery) such as sit-ups, heavy lifting, contact sports, etc.  Refrain from any intense heavy lifting or straining until you are off narcotics for pain  control.  You  will have off days, but things should improve week-by-week. DO NOT PUSH THROUGH PAIN.  Let pain be your guide: If it hurts to do something, don't do it.   Lifting restrictions   Complete by:  As directed    You may resume regular (light) daily activities beginning the next day-such as daily self-care, walking, climbing stairs-gradually increasing activities as tolerated.   If you can walk 30 minutes without difficulty, it is safe to try more intense activity such as jogging, treadmill, bicycling, low-impact aerobics, swimming, etc. Save the most intensive and strenuous activity for last such as sit-ups, heavy lifting, contact sports, etc   Refrain from any heavy lifting or straining until you are off narcotics for pain control.   DO NOT PUSH THROUGH PAIN.   Let pain be your guide: If it hurts to do something, don't do it.   Pain is your body warning you to avoid that activity for another week until the pain goes down.   Lifting restrictions   Complete by:  As directed    If you can walk 30 minutes without difficulty, it is safe to try more intense activity such as jogging, treadmill, bicycling, low-impact aerobics, swimming, etc. Save the most intensive and strenuous activity for last (Usually 4-8 weeks after surgery) such as sit-ups, heavy lifting, contact sports, etc.   Refrain from any intense heavy lifting or straining until you are off narcotics for pain control.  You will have off days, but things should improve week-by-week. DO NOT PUSH THROUGH PAIN.  Let pain be your guide: If it hurts to do something, don't do it.  Pain is your body warning you to avoid that activity for another week until the pain goes down.   May shower / Bathe   Complete by:  As directed    Wash / shower every day.  You may shower over the dressings as they are waterproof.  Continue to shower over incision(s) after the dressing is off.   May shower / Bathe   Complete by:  As directed    May walk up  steps   Complete by:  As directed    May walk up steps   Complete by:  As directed    Remove dressing in 72 hours   Complete by:  As directed    Remove your waterproof dressings, skin tapes, and other bandages 5 days after surgery.    You may leave the incision(s) open to air.   You may replace a dressing/Band-Aid to cover the incision for comfort if you wish.   Sexual Activity Restrictions   Complete by:  As directed    You may have sexual intercourse when it is comfortable. If it hurts to do something, stop.   Sexual Activity Restrictions   Complete by:  As directed    You may have sexual intercourse when it is comfortable. If it hurts to do something, stop.      Allergies as of 10/23/2018      Reactions   Cauliflower [place] Oleracea Italica] Other (See Comments)   Showed up on allergy test   Codeine Nausea And Vomiting   Fish Allergy Nausea Only   Lactose Intolerance (gi) Diarrhea   Morphine And Related Nausea And Vomiting, Other (See Comments)   Hallucinations   Oxycodone Nausea And Vomiting   Tolerates hydrocodone   Penicillins Nausea Only, Other (See Comments)   REACTION: headache, tremors, Nausea Has patient had a PCN reaction causing immediate rash, facial/tongue/throat swelling,  SOB or lightheadedness with hypotension: No Has patient had a PCN reaction causing severe rash involving mucus membranes or skin necrosis: No Has patient had a PCN reaction that required hospitalization No Has patient had a PCN reaction occurring within the last 10 years: No If all of the above answers are "NO", then may proceed with Cephalosporin use.   Sulfonamide Derivatives Swelling, Other (See Comments)   REACTION: mouth swells   Tramadol Hcl Other (See Comments)   REACTION: GI Intolerance   Triamcinolone Swelling, Other (See Comments)   REACTION: angio edema      Medication List    TAKE these medications   acetaminophen 500 MG tablet Commonly known as:  TYLENOL Take 500 mg  by mouth every 6 (six) hours as needed.   amLODipine 10 MG tablet Commonly known as:  NORVASC Take 10 mg by mouth 2 (two) times daily.   citalopram 20 MG tablet Commonly known as:  CELEXA Take 20 mg by mouth daily.   clotrimazole 1 % cream Commonly known as:  LOTRIMIN Apply 1 application topically daily as needed (for irritation).   ergocalciferol 1.25 MG (50000 UT) capsule Commonly known as:  VITAMIN D2 Take 50,000 Units by mouth every Friday.   FLUTTER Devi Use as directed   furosemide 20 MG tablet Commonly known as:  LASIX Take 20 mg by mouth daily.   HYDROcodone-acetaminophen 5-325 MG tablet Commonly known as:  NORCO/VICODIN Take 1-2 tablets by mouth every 6 (six) hours as needed for moderate pain or severe pain.   levothyroxine 125 MCG tablet Commonly known as:  SYNTHROID, LEVOTHROID Take 125 mcg by mouth daily before breakfast.   loratadine 10 MG tablet Commonly known as:  CLARITIN TAKE 1 TABLET (10 MG TOTAL) BY MOUTH DAILY. What changed:    how much to take  how to take this  when to take this  additional instructions   losartan 100 MG tablet Commonly known as:  COZAAR Take 100 mg by mouth daily.   pantoprazole 40 MG tablet Commonly known as:  PROTONIX TAKE 1 TABLET BY MOUTH DAILY   potassium chloride SA 20 MEQ tablet Commonly known as:  K-DUR,KLOR-CON Take 1 tablet (20 mEq total) by mouth 2 (two) times daily.   pregabalin 100 MG capsule Commonly known as:  LYRICA Take 100 mg by mouth 2 (two) times daily.            Discharge Care Instructions  (From admission, onward)         Start     Ordered   10/22/18 0000  Discharge wound care:    Comments:  It is good for closed incisions and even open wounds to be washed every day.  Shower every day.  Short baths are fine.  Wash the incisions and wounds clean with soap & water.     You may leave closed incisions open to air if it is dry.   You may cover the incision with clean gauze & replace  it after your daily shower for comfort.   If you have skin tapes (Steristrips) or skin glue (Dermabond) on your incision, leave them in place.  They will fall off on their own like a scab.  You may trim any edges that curl up with clean scissors.    If you have skin staples, set up an appointment for them to be removed in the office in 10 days after surgery.  If you have a drain, wash around the skin exit site with soap &  water and place a new dressing of gauze or band aid around the skin every day.  Keep the drain site clean & dry.   10/22/18 1112          Significant Diagnostic Studies:  Results for orders placed or performed during the hospital encounter of 10/22/18 (from the past 72 hour(s))  Potassium     Status: None   Collection Time: 10/22/18  8:33 AM  Result Value Ref Range   Potassium 3.9 3.5 - 5.1 mmol/L    Comment: Performed at [facility], [address] [place], [place] [address]  Potassium     Status: None   Collection Time: 10/23/18  4:12 AM  Result Value Ref Range   Potassium 4.3 3.5 - 5.1 mmol/L    Comment: Performed at [facility], [address] [place], [place] [address]  Creatinine, serum     Status: Abnormal   Collection Time: 10/23/18  4:12 AM  Result Value Ref Range   Creatinine, Ser 1.28 (H) 0.44 - 1.00 mg/dL   GFR calc non Af Amer 40 (L) >60 mL/min   GFR calc Af Amer 46 (L) >60 mL/min    Comment: Performed at [facility], [address] [place], [place] [address]    No results found.  Past Medical History:  Diagnosis Date  . Allergic rhinitis   . Anxiety   . Back pain   . Bronchiectasis   . Dyslipidemia   . Dysuria 10/14/2013  . Fatigue 08/02/2013  . GERD (gastroesophageal reflux disease)    REPORTS BECAUSE OF THIS SHE HAS TO SLEEP WITH HEAD ELEVATED   . History of Graves' disease   . Hx of colonic polyp   . Hypercalcemia   . Hyperlipidemia   . Hypertension   . Hypokalemia  11/15/2013  . 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 Murmur, heart   . Osteoarthritis, knee   . Renal insufficiency   . Shingles 2018  . Thoracic aortic aneurysm (HCC)   . Thrombocytopenia, unspecified (HCC) 08/02/2013   REPORTS AFTER 2012 , SHE WAS TOLD HER PLATELETS WERE VERY LOW AND WAS MADE TO STAY IN THE HOSPITAL FOR 3 DAYS WITH  . Thrush 10/14/2013  . Thrush, oral 09/02/2013  . Thyroid disease   . UTI (lower urinary tract infection) 10/14/2013    Past Surgical History:  Procedure Laterality Date  . ABDOMINAL HYSTERECTOMY     b/c fibroids  . APPENDECTOMY    . KNEE ARTHROPLASTY Right 10/2007  . LAPAROSCOPIC CHOLECYSTECTOMY SINGLE SITE WITH INTRAOPERATIVE CHOLANGIOGRAM N/A 10/22/2018   Procedure: LAPAROSCOPIC CHOLECYSTECTOMY SINGLE SITE;  Surgeon: Karie Soda, MD;  Location: [facility];  Service: General;  Laterality: N/A;  . TOTAL KNEE ARTHROPLASTY Bilateral    2008, 2012    Social History   Socioeconomic History  . Marital status: Single    Spouse name: Not on file  . Number of children: Not on file  . Years of education: Not on file  . Highest education level: Not on file  Occupational History  . Not on file  Social Needs  . Financial resource strain: Not on file  . Food insecurity:    Worry: Not on file    Inability: Not on file  . Transportation needs:    Medical: Not on file    Non-medical: Not on file  Tobacco Use  . Smoking status: Former Smoker    Packs/day: 1.00    Years: 10.00  Pack years: 10.00    Types: Cigarettes    Last attempt to quit: 11/11/1976    Years since quitting: 41.9  . Smokeless tobacco: Never Used  Substance and Sexual Activity  . Alcohol use: No  . Drug use: No  . Sexual activity: Not on file  Lifestyle  . Physical activity:    Days per week: Not on file    Minutes per session: Not on file  . Stress: Not on file  Relationships  . Social connections:    Talks on phone: Not on file    Gets together: Not on file      Attends religious service: Not on file    Active member of club or organization: Not on file    Attends meetings of clubs or organizations: Not on file    Relationship status: Not on file  . Intimate partner violence:    Fear of current or ex partner: Not on file    Emotionally abused: Not on file    Physically abused: Not on file    Forced sexual activity: Not on file  Other Topics Concern  . Not on file  Social History Narrative  . Not on file    Family History  Problem Relation Age of Onset  . Kidney cancer Brother 29  . Prostate cancer Brother   . Colon cancer Brother   . Breast cancer Maternal Aunt   . Liver cancer Maternal Uncle   . Colon polyps Brother   . Heart disease Mother   . Kidney disease Mother        on dialysis  . Irritable bowel syndrome Maternal Aunt     Current Facility-Administered Medications  Medication Dose Route Frequency Provider Last Rate Last Dose  . 0.9 %  sodium chloride infusion  250 mL Intravenous PRN Karie Soda, MD   Stopped at 10/22/18 1602  . acetaminophen (TYLENOL) suppository 650 mg  650 mg Rectal Q6H PRN Karie Soda, MD      . acetaminophen (TYLENOL) tablet 325-650 mg  325-650 mg Oral Q6H PRN Karie Soda, MD      . amLODipine (NORVASC) tablet 10 mg  10 mg Oral BID Karie Soda, MD   10 mg at 10/22/18 2223  . bisacodyl (DULCOLAX) suppository 10 mg  10 mg Rectal Daily PRN Karie Soda, MD      . citalopram (CELEXA) tablet 20 mg  20 mg Oral Daily Karie Soda, MD      . clotrimazole (LOTRIMIN) 1 % cream 1 application  1 application Topical Daily PRN Karie Soda, MD      . diphenhydrAMINE (BENADRYL) 12.5 MG/5ML elixir 12.5 mg  12.5 mg Oral Q6H PRN Karie Soda, MD       Or  . diphenhydrAMINE (BENADRYL) injection 12.5 mg  12.5 mg Intravenous Q6H PRN Karie Soda, MD      . enalaprilat (VASOTEC) injection 0.625-1.25 mg  0.625-1.25 mg Intravenous Q6H PRN Karie Soda, MD      . enoxaparin (LOVENOX) injection 40 mg  40 mg  Subcutaneous Q24H Karie Soda, MD      . furosemide (LASIX) tablet 20 mg  20 mg Oral Daily Karie Soda, MD      . gabapentin (NEURONTIN) capsule 300 mg  300 mg Oral BID Karie Soda, MD   300 mg at 10/22/18 2223  . hydrALAZINE (APRESOLINE) injection 5-20 mg  5-20 mg Intravenous Q4H PRN Karie Soda, MD      . HYDROcodone-acetaminophen (NORCO/VICODIN) 5-325 MG per tablet 1-2  tablet  1-2 tablet Oral Q4H PRN Karie SodaGross, Elizabth Palka, MD   1 tablet at 10/23/18 0028  . HYDROmorphone (DILAUDID) injection 0.5-2 mg  0.5-2 mg Intravenous Q2H PRN Karie SodaGross, Melvie Paglia, MD      . lactated ringers bolus 1,000 mL  1,000 mL Intravenous TID PRN Karie SodaGross, Wilmer Santillo, MD      . lactated ringers infusion 1,000 mL  1,000 mL Intravenous Q8H PRN Ranette Luckadoo, Viviann SpareSteven, MD      . levothyroxine (SYNTHROID, LEVOTHROID) tablet 125 mcg  125 mcg Oral QAC breakfast Karie SodaGross, Palak Tercero, MD   125 mcg at 10/23/18 0612  . lip balm (CARMEX) ointment 1 application  1 application Topical BID Karie SodaGross, Aspynn Clover, MD   1 application at 10/22/18 2227  . loratadine (CLARITIN) tablet 10 mg  10 mg Oral Daily Karie SodaGross, Quantez Schnyder, MD      . losartan (COZAAR) tablet 100 mg  100 mg Oral Daily Karie SodaGross, Yahya Boldman, MD      . magic mouthwash  15 mL Oral QID PRN Karie SodaGross, Devita Nies, MD      . methocarbamol (ROBAXIN) tablet 750 mg  750 mg Oral Q6H PRN Karie SodaGross, Shadell Brenn, MD      . metoprolol tartrate (LOPRESSOR) injection 5 mg  5 mg Intravenous Q6H PRN Karie SodaGross, Keagon Glascoe, MD      . ondansetron (ZOFRAN-ODT) disintegrating tablet 4 mg  4 mg Oral Q6H PRN Karie SodaGross, Delrico Minehart, MD       Or  . ondansetron (ZOFRAN) injection 4 mg  4 mg Intravenous Q6H PRN Karie SodaGross, Laurajean Hosek, MD      . pantoprazole (PROTONIX) EC tablet 40 mg  40 mg Oral Daily Karie SodaGross, Korrie Hofbauer, MD   40 mg at 10/22/18 2232  . polyethylene glycol (MIRALAX / GLYCOLAX) packet 17 g  17 g Oral Daily PRN Karie SodaGross, Keiara Sneeringer, MD      . potassium chloride SA (K-DUR,KLOR-CON) CR tablet 20 mEq  20 mEq Oral BID Karie SodaGross, Jovanni Rash, MD   20 mEq at 10/22/18 2223  . pregabalin (LYRICA) capsule  100 mg  100 mg Oral BID Karie SodaGross, Vernard Gram, MD   100 mg at 10/22/18 2223  . prochlorperazine (COMPAZINE) tablet 10 mg  10 mg Oral Q6H PRN Karie SodaGross, Lygia Olaes, MD       Or  . prochlorperazine (COMPAZINE) injection 5-10 mg  5-10 mg Intravenous Q6H PRN Karie SodaGross, Drianna Chandran, MD      . simethicone (MYLICON) chewable tablet 40 mg  40 mg Oral Q6H PRN Karie SodaGross, Riki Gehring, MD      . sodium chloride flush (NS) 0.9 % injection 3 mL  3 mL Intravenous Catha GosselinQ12H Korea Severs, MD   3 mL at 10/22/18 2233  . sodium chloride flush (NS) 0.9 % injection 3 mL  3 mL Intravenous PRN Karie SodaGross, Alyne Martinson, MD      . zolpidem (AMBIEN) tablet 5 mg  5 mg Oral QHS PRN Karie SodaGross, Shanicka Oldenkamp, MD         Allergies  Allergen Reactions  . Cauliflower [Brassica Oleracea Italica] Other (See Comments)    Showed up on allergy test  . Codeine Nausea And Vomiting  . Fish Allergy Nausea Only  . Lactose Intolerance (Gi) Diarrhea  . Morphine And Related Nausea And Vomiting and Other (See Comments)    Hallucinations  . Oxycodone Nausea And Vomiting    Tolerates hydrocodone  . Penicillins Nausea Only and Other (See Comments)    REACTION: headache, tremors, Nausea Has patient had a PCN reaction causing immediate rash, facial/tongue/throat swelling, SOB or lightheadedness with hypotension: No Has patient had a PCN reaction  causing severe rash involving mucus membranes or skin necrosis: No Has patient had a PCN reaction that required hospitalization No Has patient had a PCN reaction occurring within the last 10 years: No If all of the above answers are "NO", then may proceed with Cephalosporin use.   . Sulfonamide Derivatives Swelling and Other (See Comments)    REACTION: mouth swells  . Tramadol Hcl Other (See Comments)    REACTION: GI Intolerance  . Triamcinolone Swelling and Other (See Comments)    REACTION: angio edema    Signed: Lorenso Courier, MD, FACS, MASCRS Gastrointestinal and Minimally Invasive Surgery    [address] [place], [place] [address] [phone] Main / Paging [phone] Fax   10/23/2018, 7:19 AM

## 2018-10-23 NOTE — Evaluation (Addendum)
Physical Therapy Evaluation Patient Details Name: [name] FantasiaGloria Gould MRN: [id number] DOB: Sep 02, 1941 Today's Date: 10/23/2018   History of Present Illness  77 yo admitted 10/22/18 with  with worsening biliary colic.  Underwent cholecystectomy. H/O OA, ITP, bil TKA  Clinical Impression  The patient reports  Pain in abdomen, requested pain medication. Patient  Will benefit from rehab to return to modified independent level. Patient lives alone and has steps , also has low vision and has had a fall in recent past. Pt admitted with above diagnosis. Pt currently with functional limitations due to the deficits listed below (see PT Problem List).  Pt will benefit from skilled PT to increase their independence and safety with mobility to allow discharge to the venue listed below.       Follow Up Recommendations SNF    Equipment Recommendations  3in1 (PT)    Recommendations for Other Services       Precautions / Restrictions Precautions Precautions: Fall Precaution Comments: had a fall 1 month ago, left knee and hip pain, low vision      Mobility  Bed Mobility Overal bed mobility: Needs Assistance Bed Mobility: Sit to Supine       Sit to supine: Mod assist   General bed mobility comments: assist with legs, cues to go ot side then legs but unable to self  assist and needed assistance  Transfers Overall transfer level: Needs assistance Equipment used: Rolling walker (2 wheeled) Transfers: Sit to/from Stand Sit to Stand: Min assist            Ambulation/Gait Ambulation/Gait assistance: Mod assist;Min assist Gait Distance (Feet): 50 Feet Assistive device: Rolling walker (2 wheeled) Gait Pattern/deviations: Step-to pattern;Step-through pattern;Antalgic     General Gait Details: cues for safety , slow speed, noted antalgic LLE  Stairs            Wheelchair Mobility    Modified Rankin (Stroke Patients Only)       Balance Overall balance assessment: Needs  assistance;History of Falls Sitting-balance support: Bilateral upper extremity supported;Feet supported Sitting balance-Leahy Scale: Good     Standing balance support: During functional activity;Bilateral upper extremity supported Standing balance-Leahy Scale: Fair Standing balance comment: needs UE support.                             Pertinent Vitals/Pain Pain Assessment: 0-10 Pain Score: 7  Pain Location: abd/right side Pain Descriptors / Indicators: Discomfort;Grimacing;Guarding;Cramping Pain Intervention(s): Monitored during session;Patient requesting pain meds-RN notified;Repositioned(instructed in use of pillow for abd to cough)    Home Living Family/patient expects to be discharged to:: Private residence Living Arrangements: Alone   Type of Home: Apartment Home Access: Stairs to enter   [employer] Corporation[employer]trance Stairs-Number of Steps: from the street has 2 steps x 2  from parking lot Home Layout: One level Home Equipment: Bedside commode;Hand held shower head, 4 wheeled Rw, cane Additional Comments: needs new BSC    Prior Function Level of Independence: Independent with assistive device(s)               Hand Dominance        Extremity/Trunk Assessment   Upper Extremity Assessment Upper Extremity Assessment: RUE deficits/detail;Generalized weakness RUE Deficits / Details: decreased elevation due to RCT    Lower Extremity Assessment Lower Extremity Assessment: Generalized weakness       Communication   Communication: No difficulties  Cognition Arousal/Alertness: Awake/alert Behavior During Therapy: WFL for tasks assessed/performed Overall Cognitive Status:  Within Functional Limits for tasks assessed                                        General Comments      Exercises     Assessment/Plan    PT Assessment Patient needs continued PT services  PT Problem List Decreased strength;Decreased range of motion;Decreased activity  tolerance;Decreased balance;Decreased mobility;Pain       PT Treatment Interventions DME instruction;Gait training;Functional mobility training;Therapeutic activities;Stair training;Patient/family education    PT Goals (Current goals can be found in the Care Plan section)  Acute Rehab PT Goals Patient Stated Goal: go to rehab PT Goal Formulation: With patient Time For Goal Achievement: 11/06/18 Potential to Achieve Goals: Good    Frequency Min 2X/week   Barriers to discharge Decreased caregiver support son has an injury and unable to assist.    Co-evaluation               AM-PAC PT "6 Clicks" Mobility  Outcome Measure Help needed turning from your back to your side while in a flat bed without using bedrails?: A Lot Help needed moving from lying on your back to sitting on the side of a flat bed without using bedrails?: A Lot Help needed moving to and from a bed to a chair (including a wheelchair)?: A Lot Help needed standing up from a chair using your arms (e.g., wheelchair or bedside chair)?: A Lot Help needed to walk in hospital room?: A Lot Help needed climbing 3-5 steps with a railing? : Total 6 Click Score: 11    End of Session   Activity Tolerance: Patient limited by pain;Patient limited by fatigue Patient left: in bed;with call bell/phone within reach Nurse Communication: Mobility status PT Visit Diagnosis: Unsteadiness on feet (R26.81);Pain Pain - Right/Left: Left    Time: 1610-9604 PT Time Calculation (min) (ACUTE ONLY): 28 min   Charges:   PT Evaluation $PT Eval Low Complexity: 1 Low PT Treatments $Gait Training: 8-22 mins        Blanchard Kelch PT Acute Rehabilitation Services Pager [phone] Office [phone]   Rada Hay 10/23/2018, 12:17 PM

## 2018-10-23 NOTE — Clinical Social Work Note (Addendum)
Clinical Social Work Assessment  Patient Details  Name: [name] MRN: [id number] Date of Birth: Jan 08, 1941  Date of referral:  10/23/18               Reason for consult:  Discharge Planning                Permission sought to share information with:    Permission granted to share information::  Yes, Verbal Permission Granted  Name::        Agency::  SNF  Relationship::  [place]:     Housing/Transportation Living arrangements for the past 2 months:  Single Family Home Source of Information:  Patient Patient Interpreter Needed:  None Criminal Activity/Legal Involvement Pertinent to Current Situation/Hospitalization:  No - Comment as needed Significant Relationships:  Other Family Members Lives with:  Adult Children Do you feel safe going back to the place where you live?  Yes Need for family participation in patient care:  Yes (Comment)  Care giving concerns:   Patient admitted for: [place] CHOLANGIOGRAM (N/A) as a surgical intervention .   Patient will need Therapy at SNF.    Social Worker assessment / plan:  CSW met with the patient at bedside, explain role and reason for visit, to assist with discharge to SNF. Patient reports she is agreeable to SNF placement.  She reports she lives alone and her son just had back surgery and cannot help with her care. Patient reports she has been to [facility] in the past and would like to return. CSW sent patient clinical information via HUB. Ship broker started.   FL2 done.  PASRR done.   Employment status:  Retired Nurse, adult PT Recommendations:  [place] / Referral to community resources:  [place]  Patient/Family's Response to care:  Agreeable and Responding well to care.   Patient/Family's Understanding of and Emotional Response to Diagnosis, Current  Treatment, and Prognosis:  Patient has a good understanding of her diagnosis and discharge plan.  Emotional Assessment Appearance:  Appears stated age Attitude/Demeanor/Rapport:    Affect (typically observed):  Accepting Orientation:  Oriented to Self, Oriented to Place, Oriented to  Time, Oriented to Situation Alcohol / Substance use:  Not Applicable Psych involvement (Current and /or in the community):  No (Comment)  Discharge Needs  Concerns to be addressed:  Discharge Planning Concerns Readmission within the last 30 days:  No Current discharge risk:  Dependent with Mobility Barriers to Discharge:  Continued Medical Work up   [employer], LCSW 10/23/2018, 3:54 PM

## 2018-10-26 DIAGNOSIS — N183 Chronic kidney disease, stage 3 (moderate): Secondary | ICD-10-CM | POA: Diagnosis not present

## 2018-10-26 DIAGNOSIS — E039 Hypothyroidism, unspecified: Secondary | ICD-10-CM | POA: Diagnosis not present

## 2018-10-26 DIAGNOSIS — I10 Essential (primary) hypertension: Secondary | ICD-10-CM | POA: Diagnosis not present

## 2018-10-26 DIAGNOSIS — M170 Bilateral primary osteoarthritis of knee: Secondary | ICD-10-CM | POA: Diagnosis not present

## 2018-10-29 DIAGNOSIS — I10 Essential (primary) hypertension: Secondary | ICD-10-CM | POA: Diagnosis not present

## 2018-10-29 DIAGNOSIS — N183 Chronic kidney disease, stage 3 (moderate): Secondary | ICD-10-CM | POA: Diagnosis not present

## 2018-10-29 DIAGNOSIS — M170 Bilateral primary osteoarthritis of knee: Secondary | ICD-10-CM | POA: Diagnosis not present

## 2018-10-29 DIAGNOSIS — E039 Hypothyroidism, unspecified: Secondary | ICD-10-CM | POA: Diagnosis not present

## 2018-11-02 DIAGNOSIS — G894 Chronic pain syndrome: Secondary | ICD-10-CM | POA: Diagnosis not present

## 2018-11-02 DIAGNOSIS — N183 Chronic kidney disease, stage 3 (moderate): Secondary | ICD-10-CM | POA: Diagnosis not present

## 2018-11-02 DIAGNOSIS — E039 Hypothyroidism, unspecified: Secondary | ICD-10-CM | POA: Diagnosis not present

## 2018-11-02 DIAGNOSIS — M170 Bilateral primary osteoarthritis of knee: Secondary | ICD-10-CM | POA: Diagnosis not present

## 2018-11-09 DIAGNOSIS — I10 Essential (primary) hypertension: Secondary | ICD-10-CM | POA: Diagnosis not present

## 2018-11-09 DIAGNOSIS — M170 Bilateral primary osteoarthritis of knee: Secondary | ICD-10-CM | POA: Diagnosis not present

## 2018-11-09 DIAGNOSIS — E039 Hypothyroidism, unspecified: Secondary | ICD-10-CM | POA: Diagnosis not present

## 2018-11-09 DIAGNOSIS — N183 Chronic kidney disease, stage 3 (moderate): Secondary | ICD-10-CM | POA: Diagnosis not present

## 2018-11-19 DIAGNOSIS — Z96653 Presence of artificial knee joint, bilateral: Secondary | ICD-10-CM | POA: Diagnosis not present

## 2018-11-19 DIAGNOSIS — D631 Anemia in chronic kidney disease: Secondary | ICD-10-CM | POA: Diagnosis not present

## 2018-11-19 DIAGNOSIS — Z87891 Personal history of nicotine dependence: Secondary | ICD-10-CM | POA: Diagnosis not present

## 2018-11-19 DIAGNOSIS — Z9181 History of falling: Secondary | ICD-10-CM | POA: Diagnosis not present

## 2018-11-19 DIAGNOSIS — E039 Hypothyroidism, unspecified: Secondary | ICD-10-CM | POA: Diagnosis not present

## 2018-11-19 DIAGNOSIS — G894 Chronic pain syndrome: Secondary | ICD-10-CM | POA: Diagnosis not present

## 2018-11-19 DIAGNOSIS — I129 Hypertensive chronic kidney disease with stage 1 through stage 4 chronic kidney disease, or unspecified chronic kidney disease: Secondary | ICD-10-CM | POA: Diagnosis not present

## 2018-11-19 DIAGNOSIS — Z48815 Encounter for surgical aftercare following surgery on the digestive system: Secondary | ICD-10-CM | POA: Diagnosis not present

## 2018-11-19 DIAGNOSIS — N183 Chronic kidney disease, stage 3 (moderate): Secondary | ICD-10-CM | POA: Diagnosis not present

## 2018-11-19 DIAGNOSIS — Z9049 Acquired absence of other specified parts of digestive tract: Secondary | ICD-10-CM | POA: Diagnosis not present

## 2018-11-19 DIAGNOSIS — K219 Gastro-esophageal reflux disease without esophagitis: Secondary | ICD-10-CM | POA: Diagnosis not present

## 2018-11-20 DIAGNOSIS — Z48815 Encounter for surgical aftercare following surgery on the digestive system: Secondary | ICD-10-CM | POA: Diagnosis not present

## 2018-11-20 DIAGNOSIS — Z9181 History of falling: Secondary | ICD-10-CM | POA: Diagnosis not present

## 2018-11-20 DIAGNOSIS — K219 Gastro-esophageal reflux disease without esophagitis: Secondary | ICD-10-CM | POA: Diagnosis not present

## 2018-11-20 DIAGNOSIS — D631 Anemia in chronic kidney disease: Secondary | ICD-10-CM | POA: Diagnosis not present

## 2018-11-20 DIAGNOSIS — Z96653 Presence of artificial knee joint, bilateral: Secondary | ICD-10-CM | POA: Diagnosis not present

## 2018-11-20 DIAGNOSIS — Z9049 Acquired absence of other specified parts of digestive tract: Secondary | ICD-10-CM | POA: Diagnosis not present

## 2018-11-20 DIAGNOSIS — E039 Hypothyroidism, unspecified: Secondary | ICD-10-CM | POA: Diagnosis not present

## 2018-11-20 DIAGNOSIS — I129 Hypertensive chronic kidney disease with stage 1 through stage 4 chronic kidney disease, or unspecified chronic kidney disease: Secondary | ICD-10-CM | POA: Diagnosis not present

## 2018-11-20 DIAGNOSIS — G894 Chronic pain syndrome: Secondary | ICD-10-CM | POA: Diagnosis not present

## 2018-11-20 DIAGNOSIS — N183 Chronic kidney disease, stage 3 (moderate): Secondary | ICD-10-CM | POA: Diagnosis not present

## 2018-11-20 DIAGNOSIS — Z87891 Personal history of nicotine dependence: Secondary | ICD-10-CM | POA: Diagnosis not present

## 2018-11-24 DIAGNOSIS — Z9049 Acquired absence of other specified parts of digestive tract: Secondary | ICD-10-CM | POA: Diagnosis not present

## 2018-11-24 DIAGNOSIS — Z87891 Personal history of nicotine dependence: Secondary | ICD-10-CM | POA: Diagnosis not present

## 2018-11-24 DIAGNOSIS — K219 Gastro-esophageal reflux disease without esophagitis: Secondary | ICD-10-CM | POA: Diagnosis not present

## 2018-11-24 DIAGNOSIS — Z9181 History of falling: Secondary | ICD-10-CM | POA: Diagnosis not present

## 2018-11-24 DIAGNOSIS — Z48815 Encounter for surgical aftercare following surgery on the digestive system: Secondary | ICD-10-CM | POA: Diagnosis not present

## 2018-11-24 DIAGNOSIS — G894 Chronic pain syndrome: Secondary | ICD-10-CM | POA: Diagnosis not present

## 2018-11-24 DIAGNOSIS — N183 Chronic kidney disease, stage 3 (moderate): Secondary | ICD-10-CM | POA: Diagnosis not present

## 2018-11-24 DIAGNOSIS — D631 Anemia in chronic kidney disease: Secondary | ICD-10-CM | POA: Diagnosis not present

## 2018-11-24 DIAGNOSIS — I129 Hypertensive chronic kidney disease with stage 1 through stage 4 chronic kidney disease, or unspecified chronic kidney disease: Secondary | ICD-10-CM | POA: Diagnosis not present

## 2018-11-24 DIAGNOSIS — Z96653 Presence of artificial knee joint, bilateral: Secondary | ICD-10-CM | POA: Diagnosis not present

## 2018-11-24 DIAGNOSIS — E039 Hypothyroidism, unspecified: Secondary | ICD-10-CM | POA: Diagnosis not present

## 2018-11-25 DIAGNOSIS — Z9181 History of falling: Secondary | ICD-10-CM | POA: Diagnosis not present

## 2018-11-25 DIAGNOSIS — Z96653 Presence of artificial knee joint, bilateral: Secondary | ICD-10-CM | POA: Diagnosis not present

## 2018-11-25 DIAGNOSIS — G894 Chronic pain syndrome: Secondary | ICD-10-CM | POA: Diagnosis not present

## 2018-11-25 DIAGNOSIS — Z9049 Acquired absence of other specified parts of digestive tract: Secondary | ICD-10-CM | POA: Diagnosis not present

## 2018-11-25 DIAGNOSIS — I129 Hypertensive chronic kidney disease with stage 1 through stage 4 chronic kidney disease, or unspecified chronic kidney disease: Secondary | ICD-10-CM | POA: Diagnosis not present

## 2018-11-25 DIAGNOSIS — N183 Chronic kidney disease, stage 3 (moderate): Secondary | ICD-10-CM | POA: Diagnosis not present

## 2018-11-25 DIAGNOSIS — D631 Anemia in chronic kidney disease: Secondary | ICD-10-CM | POA: Diagnosis not present

## 2018-11-25 DIAGNOSIS — Z48815 Encounter for surgical aftercare following surgery on the digestive system: Secondary | ICD-10-CM | POA: Diagnosis not present

## 2018-11-25 DIAGNOSIS — K219 Gastro-esophageal reflux disease without esophagitis: Secondary | ICD-10-CM | POA: Diagnosis not present

## 2018-11-25 DIAGNOSIS — E039 Hypothyroidism, unspecified: Secondary | ICD-10-CM | POA: Diagnosis not present

## 2018-11-25 DIAGNOSIS — Z87891 Personal history of nicotine dependence: Secondary | ICD-10-CM | POA: Diagnosis not present

## 2018-11-26 DIAGNOSIS — K219 Gastro-esophageal reflux disease without esophagitis: Secondary | ICD-10-CM | POA: Diagnosis not present

## 2018-11-26 DIAGNOSIS — Z9181 History of falling: Secondary | ICD-10-CM | POA: Diagnosis not present

## 2018-11-26 DIAGNOSIS — E039 Hypothyroidism, unspecified: Secondary | ICD-10-CM | POA: Diagnosis not present

## 2018-11-26 DIAGNOSIS — I129 Hypertensive chronic kidney disease with stage 1 through stage 4 chronic kidney disease, or unspecified chronic kidney disease: Secondary | ICD-10-CM | POA: Diagnosis not present

## 2018-11-26 DIAGNOSIS — D631 Anemia in chronic kidney disease: Secondary | ICD-10-CM | POA: Diagnosis not present

## 2018-11-26 DIAGNOSIS — Z9049 Acquired absence of other specified parts of digestive tract: Secondary | ICD-10-CM | POA: Diagnosis not present

## 2018-11-26 DIAGNOSIS — G894 Chronic pain syndrome: Secondary | ICD-10-CM | POA: Diagnosis not present

## 2018-11-26 DIAGNOSIS — N183 Chronic kidney disease, stage 3 (moderate): Secondary | ICD-10-CM | POA: Diagnosis not present

## 2018-11-26 DIAGNOSIS — Z48815 Encounter for surgical aftercare following surgery on the digestive system: Secondary | ICD-10-CM | POA: Diagnosis not present

## 2018-11-26 DIAGNOSIS — Z96653 Presence of artificial knee joint, bilateral: Secondary | ICD-10-CM | POA: Diagnosis not present

## 2018-11-26 DIAGNOSIS — Z87891 Personal history of nicotine dependence: Secondary | ICD-10-CM | POA: Diagnosis not present

## 2018-11-27 DIAGNOSIS — G894 Chronic pain syndrome: Secondary | ICD-10-CM | POA: Diagnosis not present

## 2018-11-27 DIAGNOSIS — D631 Anemia in chronic kidney disease: Secondary | ICD-10-CM | POA: Diagnosis not present

## 2018-11-27 DIAGNOSIS — Z48815 Encounter for surgical aftercare following surgery on the digestive system: Secondary | ICD-10-CM | POA: Diagnosis not present

## 2018-11-27 DIAGNOSIS — K219 Gastro-esophageal reflux disease without esophagitis: Secondary | ICD-10-CM | POA: Diagnosis not present

## 2018-11-27 DIAGNOSIS — Z96653 Presence of artificial knee joint, bilateral: Secondary | ICD-10-CM | POA: Diagnosis not present

## 2018-11-27 DIAGNOSIS — E039 Hypothyroidism, unspecified: Secondary | ICD-10-CM | POA: Diagnosis not present

## 2018-11-27 DIAGNOSIS — Z87891 Personal history of nicotine dependence: Secondary | ICD-10-CM | POA: Diagnosis not present

## 2018-11-27 DIAGNOSIS — N183 Chronic kidney disease, stage 3 (moderate): Secondary | ICD-10-CM | POA: Diagnosis not present

## 2018-11-27 DIAGNOSIS — Z9181 History of falling: Secondary | ICD-10-CM | POA: Diagnosis not present

## 2018-11-27 DIAGNOSIS — Z9049 Acquired absence of other specified parts of digestive tract: Secondary | ICD-10-CM | POA: Diagnosis not present

## 2018-11-27 DIAGNOSIS — I129 Hypertensive chronic kidney disease with stage 1 through stage 4 chronic kidney disease, or unspecified chronic kidney disease: Secondary | ICD-10-CM | POA: Diagnosis not present

## 2018-12-02 ENCOUNTER — Ambulatory Visit: Payer: Self-pay | Admitting: Surgery

## 2018-12-02 DIAGNOSIS — K219 Gastro-esophageal reflux disease without esophagitis: Secondary | ICD-10-CM | POA: Diagnosis not present

## 2018-12-02 DIAGNOSIS — Z87891 Personal history of nicotine dependence: Secondary | ICD-10-CM | POA: Diagnosis not present

## 2018-12-02 DIAGNOSIS — Z9181 History of falling: Secondary | ICD-10-CM | POA: Diagnosis not present

## 2018-12-02 DIAGNOSIS — N183 Chronic kidney disease, stage 3 (moderate): Secondary | ICD-10-CM | POA: Diagnosis not present

## 2018-12-02 DIAGNOSIS — D631 Anemia in chronic kidney disease: Secondary | ICD-10-CM | POA: Diagnosis not present

## 2018-12-02 DIAGNOSIS — Z96653 Presence of artificial knee joint, bilateral: Secondary | ICD-10-CM | POA: Diagnosis not present

## 2018-12-02 DIAGNOSIS — Z9049 Acquired absence of other specified parts of digestive tract: Secondary | ICD-10-CM | POA: Diagnosis not present

## 2018-12-02 DIAGNOSIS — I129 Hypertensive chronic kidney disease with stage 1 through stage 4 chronic kidney disease, or unspecified chronic kidney disease: Secondary | ICD-10-CM | POA: Diagnosis not present

## 2018-12-02 DIAGNOSIS — E039 Hypothyroidism, unspecified: Secondary | ICD-10-CM | POA: Diagnosis not present

## 2018-12-02 DIAGNOSIS — G894 Chronic pain syndrome: Secondary | ICD-10-CM | POA: Diagnosis not present

## 2018-12-02 DIAGNOSIS — Z48815 Encounter for surgical aftercare following surgery on the digestive system: Secondary | ICD-10-CM | POA: Diagnosis not present

## 2018-12-03 DIAGNOSIS — I10 Essential (primary) hypertension: Secondary | ICD-10-CM | POA: Diagnosis not present

## 2018-12-03 DIAGNOSIS — R2681 Unsteadiness on feet: Secondary | ICD-10-CM | POA: Diagnosis not present

## 2018-12-03 DIAGNOSIS — B0229 Other postherpetic nervous system involvement: Secondary | ICD-10-CM | POA: Diagnosis not present

## 2018-12-03 DIAGNOSIS — E039 Hypothyroidism, unspecified: Secondary | ICD-10-CM | POA: Diagnosis not present

## 2018-12-08 DIAGNOSIS — I129 Hypertensive chronic kidney disease with stage 1 through stage 4 chronic kidney disease, or unspecified chronic kidney disease: Secondary | ICD-10-CM | POA: Diagnosis not present

## 2018-12-08 DIAGNOSIS — Z87891 Personal history of nicotine dependence: Secondary | ICD-10-CM | POA: Diagnosis not present

## 2018-12-08 DIAGNOSIS — K219 Gastro-esophageal reflux disease without esophagitis: Secondary | ICD-10-CM | POA: Diagnosis not present

## 2018-12-08 DIAGNOSIS — E039 Hypothyroidism, unspecified: Secondary | ICD-10-CM | POA: Diagnosis not present

## 2018-12-08 DIAGNOSIS — Z9181 History of falling: Secondary | ICD-10-CM | POA: Diagnosis not present

## 2018-12-08 DIAGNOSIS — N183 Chronic kidney disease, stage 3 (moderate): Secondary | ICD-10-CM | POA: Diagnosis not present

## 2018-12-08 DIAGNOSIS — Z48815 Encounter for surgical aftercare following surgery on the digestive system: Secondary | ICD-10-CM | POA: Diagnosis not present

## 2018-12-08 DIAGNOSIS — Z96653 Presence of artificial knee joint, bilateral: Secondary | ICD-10-CM | POA: Diagnosis not present

## 2018-12-08 DIAGNOSIS — Z9049 Acquired absence of other specified parts of digestive tract: Secondary | ICD-10-CM | POA: Diagnosis not present

## 2018-12-08 DIAGNOSIS — G894 Chronic pain syndrome: Secondary | ICD-10-CM | POA: Diagnosis not present

## 2018-12-08 DIAGNOSIS — D631 Anemia in chronic kidney disease: Secondary | ICD-10-CM | POA: Diagnosis not present

## 2018-12-09 DIAGNOSIS — G894 Chronic pain syndrome: Secondary | ICD-10-CM | POA: Diagnosis not present

## 2018-12-09 DIAGNOSIS — N183 Chronic kidney disease, stage 3 (moderate): Secondary | ICD-10-CM | POA: Diagnosis not present

## 2018-12-09 DIAGNOSIS — Z87891 Personal history of nicotine dependence: Secondary | ICD-10-CM | POA: Diagnosis not present

## 2018-12-09 DIAGNOSIS — E039 Hypothyroidism, unspecified: Secondary | ICD-10-CM | POA: Diagnosis not present

## 2018-12-09 DIAGNOSIS — Z9181 History of falling: Secondary | ICD-10-CM | POA: Diagnosis not present

## 2018-12-09 DIAGNOSIS — Z48815 Encounter for surgical aftercare following surgery on the digestive system: Secondary | ICD-10-CM | POA: Diagnosis not present

## 2018-12-09 DIAGNOSIS — Z96653 Presence of artificial knee joint, bilateral: Secondary | ICD-10-CM | POA: Diagnosis not present

## 2018-12-09 DIAGNOSIS — D631 Anemia in chronic kidney disease: Secondary | ICD-10-CM | POA: Diagnosis not present

## 2018-12-09 DIAGNOSIS — K219 Gastro-esophageal reflux disease without esophagitis: Secondary | ICD-10-CM | POA: Diagnosis not present

## 2018-12-09 DIAGNOSIS — I129 Hypertensive chronic kidney disease with stage 1 through stage 4 chronic kidney disease, or unspecified chronic kidney disease: Secondary | ICD-10-CM | POA: Diagnosis not present

## 2018-12-09 DIAGNOSIS — Z9049 Acquired absence of other specified parts of digestive tract: Secondary | ICD-10-CM | POA: Diagnosis not present

## 2018-12-10 DIAGNOSIS — Z87891 Personal history of nicotine dependence: Secondary | ICD-10-CM | POA: Diagnosis not present

## 2018-12-10 DIAGNOSIS — G894 Chronic pain syndrome: Secondary | ICD-10-CM | POA: Diagnosis not present

## 2018-12-10 DIAGNOSIS — Z9181 History of falling: Secondary | ICD-10-CM | POA: Diagnosis not present

## 2018-12-10 DIAGNOSIS — I129 Hypertensive chronic kidney disease with stage 1 through stage 4 chronic kidney disease, or unspecified chronic kidney disease: Secondary | ICD-10-CM | POA: Diagnosis not present

## 2018-12-10 DIAGNOSIS — E039 Hypothyroidism, unspecified: Secondary | ICD-10-CM | POA: Diagnosis not present

## 2018-12-10 DIAGNOSIS — Z9049 Acquired absence of other specified parts of digestive tract: Secondary | ICD-10-CM | POA: Diagnosis not present

## 2018-12-10 DIAGNOSIS — K219 Gastro-esophageal reflux disease without esophagitis: Secondary | ICD-10-CM | POA: Diagnosis not present

## 2018-12-10 DIAGNOSIS — N183 Chronic kidney disease, stage 3 (moderate): Secondary | ICD-10-CM | POA: Diagnosis not present

## 2018-12-10 DIAGNOSIS — D631 Anemia in chronic kidney disease: Secondary | ICD-10-CM | POA: Diagnosis not present

## 2018-12-10 DIAGNOSIS — Z48815 Encounter for surgical aftercare following surgery on the digestive system: Secondary | ICD-10-CM | POA: Diagnosis not present

## 2018-12-10 DIAGNOSIS — Z96653 Presence of artificial knee joint, bilateral: Secondary | ICD-10-CM | POA: Diagnosis not present

## 2018-12-11 DIAGNOSIS — I129 Hypertensive chronic kidney disease with stage 1 through stage 4 chronic kidney disease, or unspecified chronic kidney disease: Secondary | ICD-10-CM | POA: Diagnosis not present

## 2018-12-11 DIAGNOSIS — N183 Chronic kidney disease, stage 3 (moderate): Secondary | ICD-10-CM | POA: Diagnosis not present

## 2018-12-11 DIAGNOSIS — Z96653 Presence of artificial knee joint, bilateral: Secondary | ICD-10-CM | POA: Diagnosis not present

## 2018-12-11 DIAGNOSIS — Z9049 Acquired absence of other specified parts of digestive tract: Secondary | ICD-10-CM | POA: Diagnosis not present

## 2018-12-11 DIAGNOSIS — E039 Hypothyroidism, unspecified: Secondary | ICD-10-CM | POA: Diagnosis not present

## 2018-12-11 DIAGNOSIS — Z9181 History of falling: Secondary | ICD-10-CM | POA: Diagnosis not present

## 2018-12-11 DIAGNOSIS — Z48815 Encounter for surgical aftercare following surgery on the digestive system: Secondary | ICD-10-CM | POA: Diagnosis not present

## 2018-12-11 DIAGNOSIS — D631 Anemia in chronic kidney disease: Secondary | ICD-10-CM | POA: Diagnosis not present

## 2018-12-11 DIAGNOSIS — K219 Gastro-esophageal reflux disease without esophagitis: Secondary | ICD-10-CM | POA: Diagnosis not present

## 2018-12-11 DIAGNOSIS — Z87891 Personal history of nicotine dependence: Secondary | ICD-10-CM | POA: Diagnosis not present

## 2018-12-11 DIAGNOSIS — G894 Chronic pain syndrome: Secondary | ICD-10-CM | POA: Diagnosis not present

## 2018-12-15 DIAGNOSIS — Z48815 Encounter for surgical aftercare following surgery on the digestive system: Secondary | ICD-10-CM | POA: Diagnosis not present

## 2018-12-15 DIAGNOSIS — E039 Hypothyroidism, unspecified: Secondary | ICD-10-CM | POA: Diagnosis not present

## 2018-12-15 DIAGNOSIS — D631 Anemia in chronic kidney disease: Secondary | ICD-10-CM | POA: Diagnosis not present

## 2018-12-15 DIAGNOSIS — K219 Gastro-esophageal reflux disease without esophagitis: Secondary | ICD-10-CM | POA: Diagnosis not present

## 2018-12-15 DIAGNOSIS — Z96653 Presence of artificial knee joint, bilateral: Secondary | ICD-10-CM | POA: Diagnosis not present

## 2018-12-15 DIAGNOSIS — Z9181 History of falling: Secondary | ICD-10-CM | POA: Diagnosis not present

## 2018-12-15 DIAGNOSIS — Z87891 Personal history of nicotine dependence: Secondary | ICD-10-CM | POA: Diagnosis not present

## 2018-12-15 DIAGNOSIS — Z9049 Acquired absence of other specified parts of digestive tract: Secondary | ICD-10-CM | POA: Diagnosis not present

## 2018-12-15 DIAGNOSIS — N183 Chronic kidney disease, stage 3 (moderate): Secondary | ICD-10-CM | POA: Diagnosis not present

## 2018-12-15 DIAGNOSIS — G894 Chronic pain syndrome: Secondary | ICD-10-CM | POA: Diagnosis not present

## 2018-12-15 DIAGNOSIS — I129 Hypertensive chronic kidney disease with stage 1 through stage 4 chronic kidney disease, or unspecified chronic kidney disease: Secondary | ICD-10-CM | POA: Diagnosis not present

## 2018-12-18 DIAGNOSIS — Z9049 Acquired absence of other specified parts of digestive tract: Secondary | ICD-10-CM | POA: Diagnosis not present

## 2018-12-18 DIAGNOSIS — I129 Hypertensive chronic kidney disease with stage 1 through stage 4 chronic kidney disease, or unspecified chronic kidney disease: Secondary | ICD-10-CM | POA: Diagnosis not present

## 2018-12-18 DIAGNOSIS — Z87891 Personal history of nicotine dependence: Secondary | ICD-10-CM | POA: Diagnosis not present

## 2018-12-18 DIAGNOSIS — Z48815 Encounter for surgical aftercare following surgery on the digestive system: Secondary | ICD-10-CM | POA: Diagnosis not present

## 2018-12-18 DIAGNOSIS — G894 Chronic pain syndrome: Secondary | ICD-10-CM | POA: Diagnosis not present

## 2018-12-18 DIAGNOSIS — N183 Chronic kidney disease, stage 3 (moderate): Secondary | ICD-10-CM | POA: Diagnosis not present

## 2018-12-18 DIAGNOSIS — E039 Hypothyroidism, unspecified: Secondary | ICD-10-CM | POA: Diagnosis not present

## 2018-12-18 DIAGNOSIS — Z9181 History of falling: Secondary | ICD-10-CM | POA: Diagnosis not present

## 2018-12-18 DIAGNOSIS — D631 Anemia in chronic kidney disease: Secondary | ICD-10-CM | POA: Diagnosis not present

## 2018-12-18 DIAGNOSIS — K219 Gastro-esophageal reflux disease without esophagitis: Secondary | ICD-10-CM | POA: Diagnosis not present

## 2018-12-18 DIAGNOSIS — Z96653 Presence of artificial knee joint, bilateral: Secondary | ICD-10-CM | POA: Diagnosis not present

## 2018-12-29 DIAGNOSIS — N189 Chronic kidney disease, unspecified: Secondary | ICD-10-CM | POA: Diagnosis not present

## 2018-12-29 DIAGNOSIS — N6310 Unspecified lump in the right breast, unspecified quadrant: Secondary | ICD-10-CM | POA: Diagnosis not present

## 2018-12-29 DIAGNOSIS — D631 Anemia in chronic kidney disease: Secondary | ICD-10-CM | POA: Diagnosis not present

## 2018-12-29 DIAGNOSIS — E0500 Thyrotoxicosis with diffuse goiter without thyrotoxic crisis or storm: Secondary | ICD-10-CM | POA: Diagnosis not present

## 2018-12-29 DIAGNOSIS — E785 Hyperlipidemia, unspecified: Secondary | ICD-10-CM | POA: Diagnosis not present

## 2018-12-29 DIAGNOSIS — N2581 Secondary hyperparathyroidism of renal origin: Secondary | ICD-10-CM | POA: Diagnosis not present

## 2018-12-29 DIAGNOSIS — N183 Chronic kidney disease, stage 3 (moderate): Secondary | ICD-10-CM | POA: Diagnosis not present

## 2019-01-29 DIAGNOSIS — N2581 Secondary hyperparathyroidism of renal origin: Secondary | ICD-10-CM | POA: Diagnosis not present

## 2019-01-29 DIAGNOSIS — N183 Chronic kidney disease, stage 3 (moderate): Secondary | ICD-10-CM | POA: Diagnosis not present

## 2019-02-10 DIAGNOSIS — I10 Essential (primary) hypertension: Secondary | ICD-10-CM | POA: Diagnosis not present

## 2019-02-10 DIAGNOSIS — E039 Hypothyroidism, unspecified: Secondary | ICD-10-CM | POA: Diagnosis not present

## 2019-02-17 DIAGNOSIS — R7309 Other abnormal glucose: Secondary | ICD-10-CM | POA: Diagnosis not present

## 2019-02-17 DIAGNOSIS — B0229 Other postherpetic nervous system involvement: Secondary | ICD-10-CM | POA: Diagnosis not present

## 2019-02-17 DIAGNOSIS — I10 Essential (primary) hypertension: Secondary | ICD-10-CM | POA: Diagnosis not present

## 2019-02-17 DIAGNOSIS — E039 Hypothyroidism, unspecified: Secondary | ICD-10-CM | POA: Diagnosis not present

## 2019-03-27 IMAGING — US US ABDOMEN LIMITED
1 series · 13 of 25 positions shown · non-contrast
Comparison: CT abdomen and pelvis April 09, 2017

CLINICAL DATA: Right upper quadrant pain

EXAM:
ULTRASOUND ABDOMEN LIMITED RIGHT UPPER QUADRANT

[Series 1: us abdomen limited · 0.21mm/px · 13 of 48 slices shown]
[im 1/48]
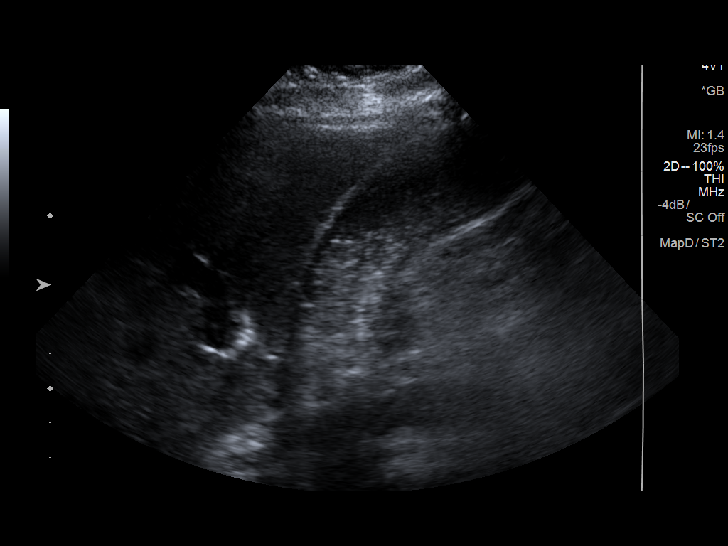
[im 4/48]
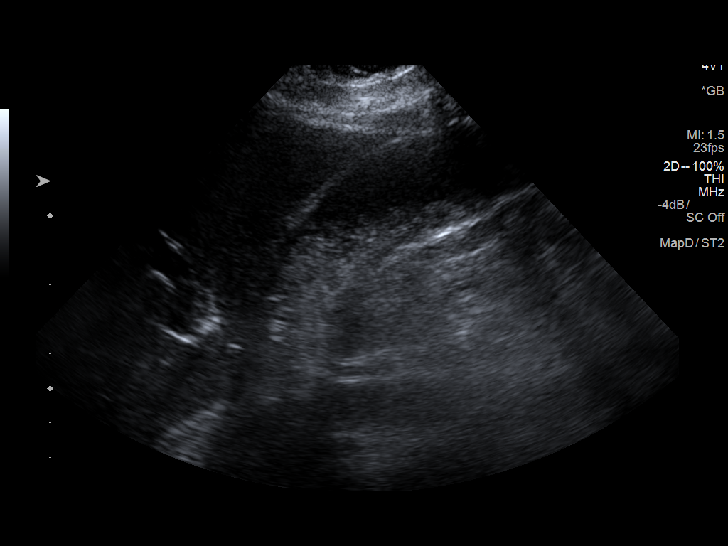
[im 8/48]
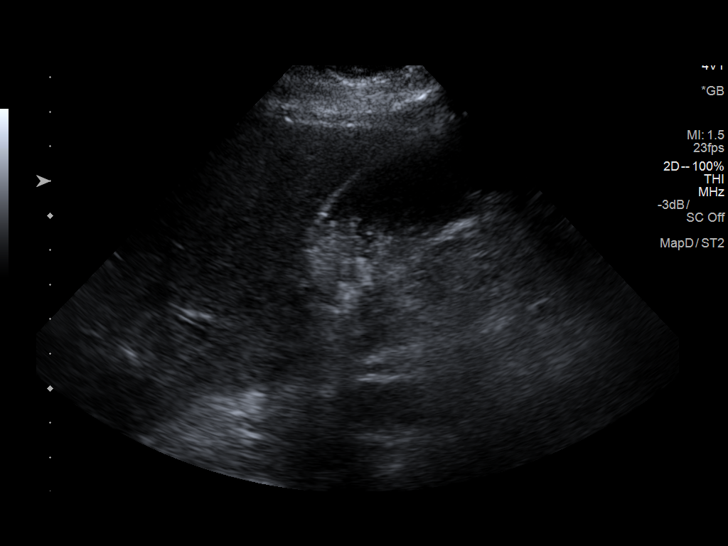
[im 12/48]
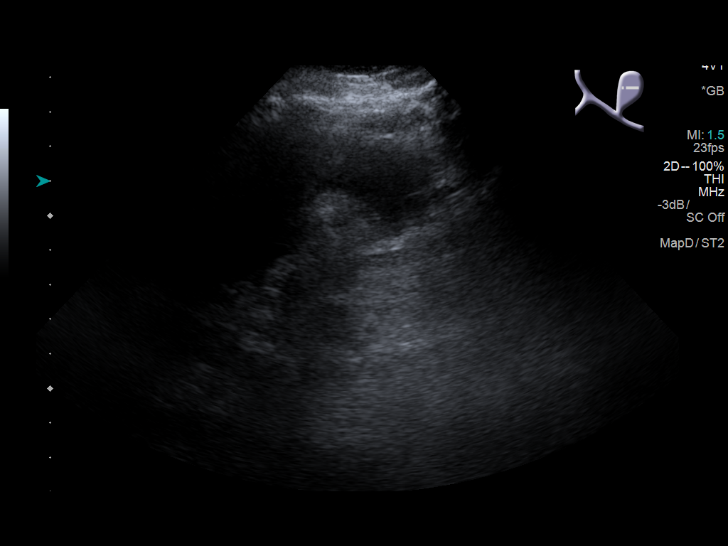
[im 16/48]
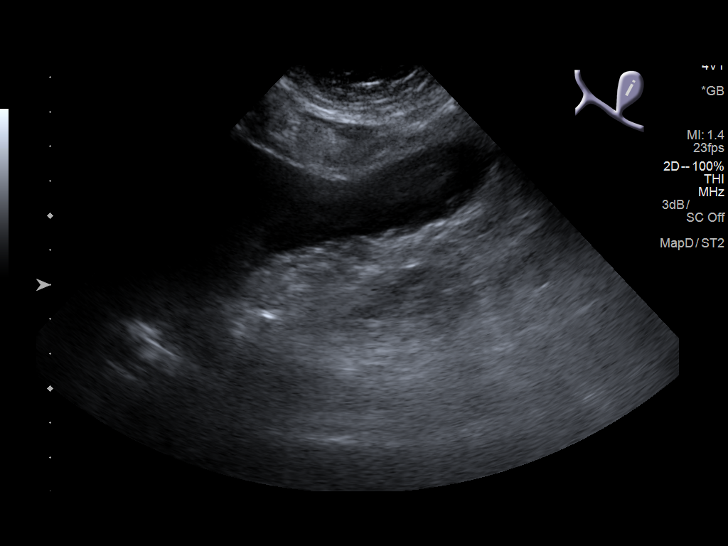
[im 20/48]
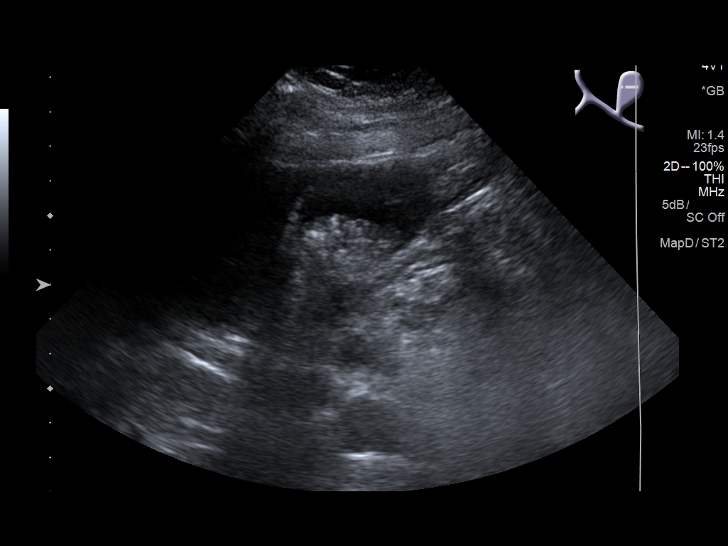
[im 24/48]
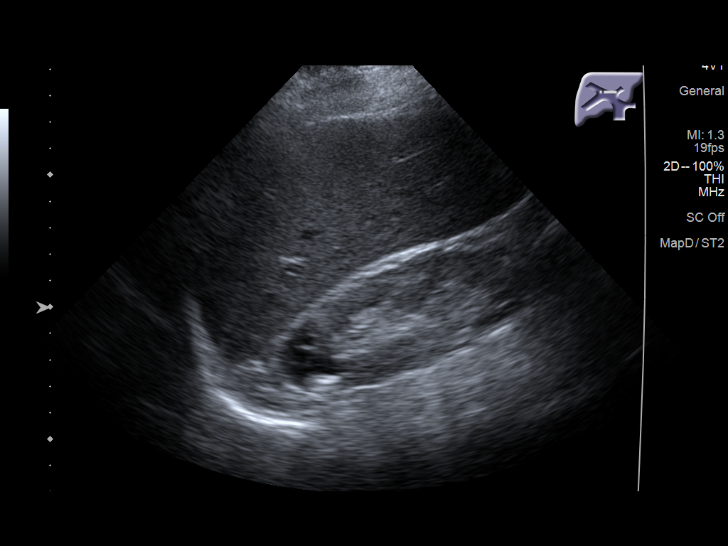
[im 28/48]
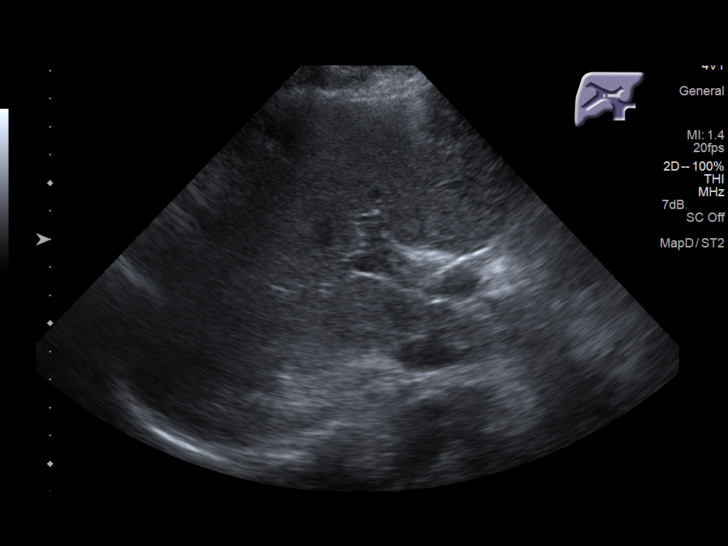
[im 32/48]
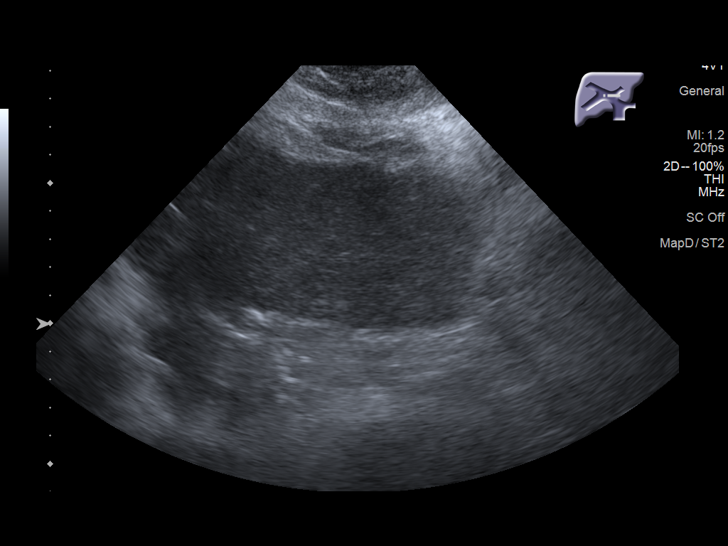
[im 36/48]
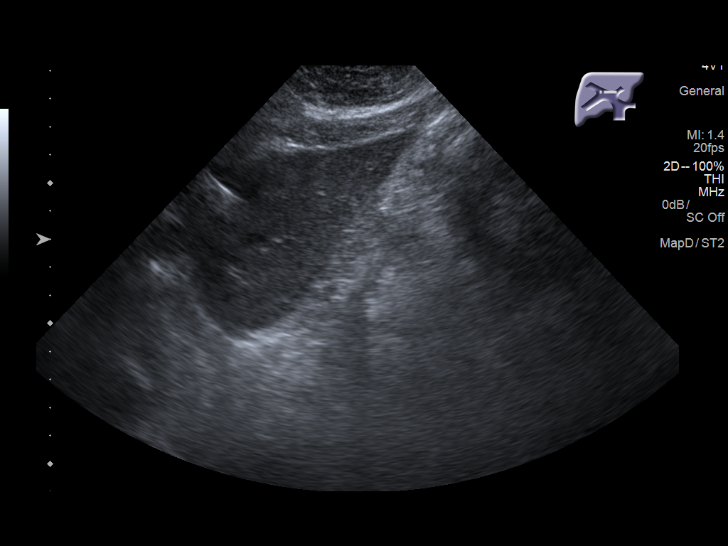
[im 40/48]
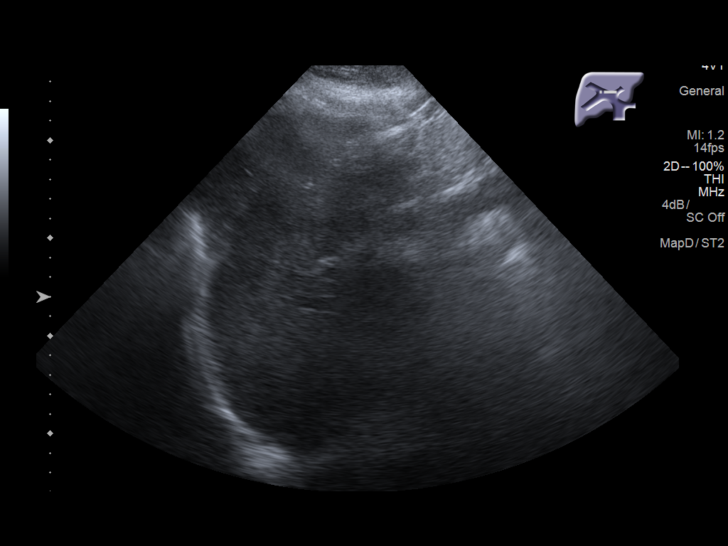
[im 44/48]
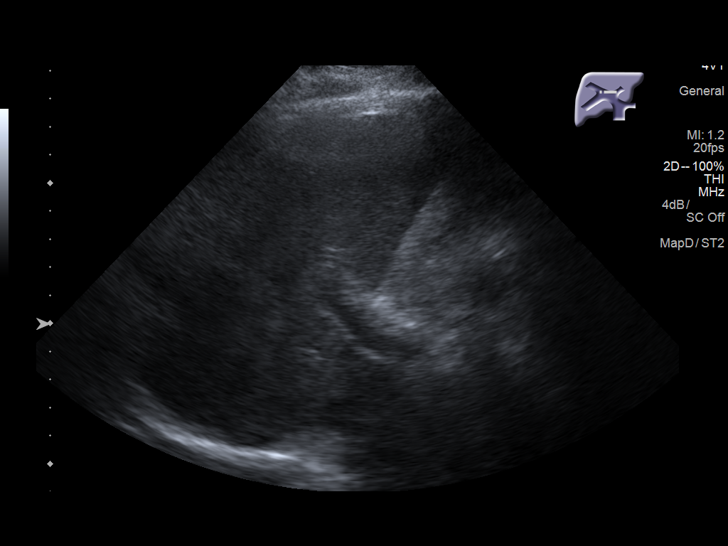
[im 48/48]
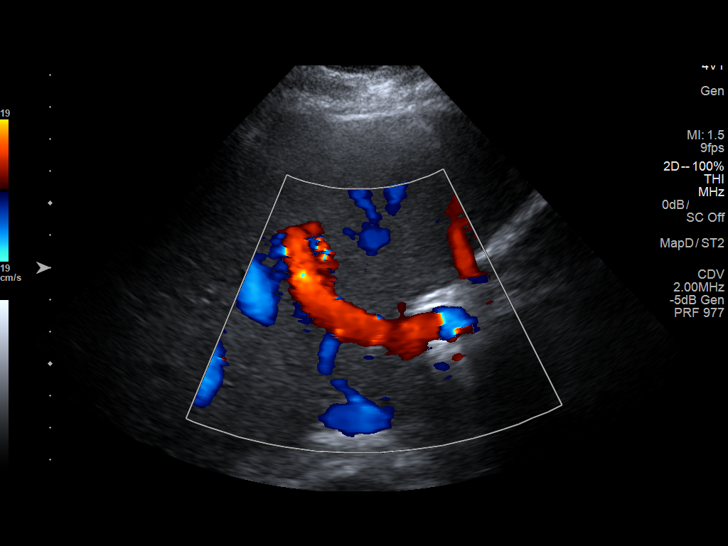

[13 of 25 positions shown; findings below may reference images not displayed]

FINDINGS: Gallbladder:

Within the gallbladder, there are multiple echogenic foci which move
and shadow consistent with cholelithiasis. Sludge is also noted in
the gallbladder. The largest gallstone measures 3 mm in length.
Gallbladder wall is slightly thickened along its anterior aspect. No
edema or pericholecystic fluid is noted in the gallbladder wall. No
sonographic Murphy sign noted by sonographer.

Common bile duct:

Diameter: 3 mm. No intrahepatic or extrahepatic biliary duct
dilatation.

Liver:

No focal lesion identified. A cyst seen in the right lobe of the
liver anteriorly on prior CT is not currently visualized by
ultrasound. Within normal limits in parenchymal echogenicity. Portal
vein is patent on color Doppler imaging with normal direction of
blood flow towards the liver.
IMPRESSION: 1. Cholelithiasis and sludge within the gallbladder. Slight
thickening of a portion of the anterior aspect of the gallbladder
wall. No pericholecystic fluid. A degree of early cholecystitis may
be present given these findings. In this regard, it may be prudent
to correlate with nuclear medicine hepatobiliary imaging study to
assess for cystic duct patency.

2. Study otherwise unremarkable. Note that a previously noted cyst
in the anterior aspect of the right lobe of the liver seen on CT is
not appreciable on this current sonographic examination.

## 2019-04-14 DIAGNOSIS — E039 Hypothyroidism, unspecified: Secondary | ICD-10-CM | POA: Diagnosis not present

## 2019-04-21 DIAGNOSIS — R2681 Unsteadiness on feet: Secondary | ICD-10-CM | POA: Diagnosis not present

## 2019-04-21 DIAGNOSIS — E039 Hypothyroidism, unspecified: Secondary | ICD-10-CM | POA: Diagnosis not present

## 2019-04-21 DIAGNOSIS — M1990 Unspecified osteoarthritis, unspecified site: Secondary | ICD-10-CM | POA: Diagnosis not present

## 2019-04-21 DIAGNOSIS — D638 Anemia in other chronic diseases classified elsewhere: Secondary | ICD-10-CM | POA: Diagnosis not present

## 2019-04-23 DIAGNOSIS — J479 Bronchiectasis, uncomplicated: Secondary | ICD-10-CM | POA: Diagnosis not present

## 2019-04-23 DIAGNOSIS — N183 Chronic kidney disease, stage 3 (moderate): Secondary | ICD-10-CM | POA: Diagnosis not present

## 2019-04-23 DIAGNOSIS — G894 Chronic pain syndrome: Secondary | ICD-10-CM | POA: Diagnosis not present

## 2019-04-23 DIAGNOSIS — M75101 Unspecified rotator cuff tear or rupture of right shoulder, not specified as traumatic: Secondary | ICD-10-CM | POA: Diagnosis not present

## 2019-04-23 DIAGNOSIS — I129 Hypertensive chronic kidney disease with stage 1 through stage 4 chronic kidney disease, or unspecified chronic kidney disease: Secondary | ICD-10-CM | POA: Diagnosis not present

## 2019-04-23 DIAGNOSIS — D631 Anemia in chronic kidney disease: Secondary | ICD-10-CM | POA: Diagnosis not present

## 2019-04-23 DIAGNOSIS — E039 Hypothyroidism, unspecified: Secondary | ICD-10-CM | POA: Diagnosis not present

## 2019-04-23 DIAGNOSIS — M5136 Other intervertebral disc degeneration, lumbar region: Secondary | ICD-10-CM | POA: Diagnosis not present

## 2019-04-23 DIAGNOSIS — Z9181 History of falling: Secondary | ICD-10-CM | POA: Diagnosis not present

## 2019-04-23 DIAGNOSIS — K219 Gastro-esophageal reflux disease without esophagitis: Secondary | ICD-10-CM | POA: Diagnosis not present

## 2019-04-23 DIAGNOSIS — G629 Polyneuropathy, unspecified: Secondary | ICD-10-CM | POA: Diagnosis not present

## 2019-04-23 DIAGNOSIS — B0229 Other postherpetic nervous system involvement: Secondary | ICD-10-CM | POA: Diagnosis not present

## 2019-04-23 DIAGNOSIS — M1990 Unspecified osteoarthritis, unspecified site: Secondary | ICD-10-CM | POA: Diagnosis not present

## 2019-04-26 DIAGNOSIS — N183 Chronic kidney disease, stage 3 (moderate): Secondary | ICD-10-CM | POA: Diagnosis not present

## 2019-04-26 DIAGNOSIS — E039 Hypothyroidism, unspecified: Secondary | ICD-10-CM | POA: Diagnosis not present

## 2019-04-26 DIAGNOSIS — G894 Chronic pain syndrome: Secondary | ICD-10-CM | POA: Diagnosis not present

## 2019-04-26 DIAGNOSIS — M1990 Unspecified osteoarthritis, unspecified site: Secondary | ICD-10-CM | POA: Diagnosis not present

## 2019-04-26 DIAGNOSIS — Z9181 History of falling: Secondary | ICD-10-CM | POA: Diagnosis not present

## 2019-04-26 DIAGNOSIS — G629 Polyneuropathy, unspecified: Secondary | ICD-10-CM | POA: Diagnosis not present

## 2019-04-26 DIAGNOSIS — K219 Gastro-esophageal reflux disease without esophagitis: Secondary | ICD-10-CM | POA: Diagnosis not present

## 2019-04-26 DIAGNOSIS — D631 Anemia in chronic kidney disease: Secondary | ICD-10-CM | POA: Diagnosis not present

## 2019-04-26 DIAGNOSIS — J479 Bronchiectasis, uncomplicated: Secondary | ICD-10-CM | POA: Diagnosis not present

## 2019-04-26 DIAGNOSIS — M5136 Other intervertebral disc degeneration, lumbar region: Secondary | ICD-10-CM | POA: Diagnosis not present

## 2019-04-26 DIAGNOSIS — B0229 Other postherpetic nervous system involvement: Secondary | ICD-10-CM | POA: Diagnosis not present

## 2019-04-26 DIAGNOSIS — M75101 Unspecified rotator cuff tear or rupture of right shoulder, not specified as traumatic: Secondary | ICD-10-CM | POA: Diagnosis not present

## 2019-04-26 DIAGNOSIS — I129 Hypertensive chronic kidney disease with stage 1 through stage 4 chronic kidney disease, or unspecified chronic kidney disease: Secondary | ICD-10-CM | POA: Diagnosis not present

## 2019-04-27 DIAGNOSIS — M5136 Other intervertebral disc degeneration, lumbar region: Secondary | ICD-10-CM | POA: Diagnosis not present

## 2019-04-27 DIAGNOSIS — N183 Chronic kidney disease, stage 3 (moderate): Secondary | ICD-10-CM | POA: Diagnosis not present

## 2019-04-27 DIAGNOSIS — M75101 Unspecified rotator cuff tear or rupture of right shoulder, not specified as traumatic: Secondary | ICD-10-CM | POA: Diagnosis not present

## 2019-04-27 DIAGNOSIS — J479 Bronchiectasis, uncomplicated: Secondary | ICD-10-CM | POA: Diagnosis not present

## 2019-04-27 DIAGNOSIS — B0229 Other postherpetic nervous system involvement: Secondary | ICD-10-CM | POA: Diagnosis not present

## 2019-04-27 DIAGNOSIS — G629 Polyneuropathy, unspecified: Secondary | ICD-10-CM | POA: Diagnosis not present

## 2019-04-27 DIAGNOSIS — M1990 Unspecified osteoarthritis, unspecified site: Secondary | ICD-10-CM | POA: Diagnosis not present

## 2019-04-27 DIAGNOSIS — K219 Gastro-esophageal reflux disease without esophagitis: Secondary | ICD-10-CM | POA: Diagnosis not present

## 2019-04-27 DIAGNOSIS — D631 Anemia in chronic kidney disease: Secondary | ICD-10-CM | POA: Diagnosis not present

## 2019-04-27 DIAGNOSIS — I129 Hypertensive chronic kidney disease with stage 1 through stage 4 chronic kidney disease, or unspecified chronic kidney disease: Secondary | ICD-10-CM | POA: Diagnosis not present

## 2019-04-27 DIAGNOSIS — Z9181 History of falling: Secondary | ICD-10-CM | POA: Diagnosis not present

## 2019-04-27 DIAGNOSIS — E039 Hypothyroidism, unspecified: Secondary | ICD-10-CM | POA: Diagnosis not present

## 2019-04-27 DIAGNOSIS — G894 Chronic pain syndrome: Secondary | ICD-10-CM | POA: Diagnosis not present

## 2019-04-29 DIAGNOSIS — Z9181 History of falling: Secondary | ICD-10-CM | POA: Diagnosis not present

## 2019-04-29 DIAGNOSIS — M5136 Other intervertebral disc degeneration, lumbar region: Secondary | ICD-10-CM | POA: Diagnosis not present

## 2019-04-29 DIAGNOSIS — D631 Anemia in chronic kidney disease: Secondary | ICD-10-CM | POA: Diagnosis not present

## 2019-04-29 DIAGNOSIS — G894 Chronic pain syndrome: Secondary | ICD-10-CM | POA: Diagnosis not present

## 2019-04-29 DIAGNOSIS — M1990 Unspecified osteoarthritis, unspecified site: Secondary | ICD-10-CM | POA: Diagnosis not present

## 2019-04-29 DIAGNOSIS — I129 Hypertensive chronic kidney disease with stage 1 through stage 4 chronic kidney disease, or unspecified chronic kidney disease: Secondary | ICD-10-CM | POA: Diagnosis not present

## 2019-04-29 DIAGNOSIS — K219 Gastro-esophageal reflux disease without esophagitis: Secondary | ICD-10-CM | POA: Diagnosis not present

## 2019-04-29 DIAGNOSIS — E039 Hypothyroidism, unspecified: Secondary | ICD-10-CM | POA: Diagnosis not present

## 2019-04-29 DIAGNOSIS — N183 Chronic kidney disease, stage 3 (moderate): Secondary | ICD-10-CM | POA: Diagnosis not present

## 2019-04-29 DIAGNOSIS — B0229 Other postherpetic nervous system involvement: Secondary | ICD-10-CM | POA: Diagnosis not present

## 2019-04-29 DIAGNOSIS — G629 Polyneuropathy, unspecified: Secondary | ICD-10-CM | POA: Diagnosis not present

## 2019-04-29 DIAGNOSIS — M75101 Unspecified rotator cuff tear or rupture of right shoulder, not specified as traumatic: Secondary | ICD-10-CM | POA: Diagnosis not present

## 2019-04-29 DIAGNOSIS — J479 Bronchiectasis, uncomplicated: Secondary | ICD-10-CM | POA: Diagnosis not present

## 2019-04-30 DIAGNOSIS — M5136 Other intervertebral disc degeneration, lumbar region: Secondary | ICD-10-CM | POA: Diagnosis not present

## 2019-04-30 DIAGNOSIS — E039 Hypothyroidism, unspecified: Secondary | ICD-10-CM | POA: Diagnosis not present

## 2019-04-30 DIAGNOSIS — J479 Bronchiectasis, uncomplicated: Secondary | ICD-10-CM | POA: Diagnosis not present

## 2019-04-30 DIAGNOSIS — D631 Anemia in chronic kidney disease: Secondary | ICD-10-CM | POA: Diagnosis not present

## 2019-04-30 DIAGNOSIS — I129 Hypertensive chronic kidney disease with stage 1 through stage 4 chronic kidney disease, or unspecified chronic kidney disease: Secondary | ICD-10-CM | POA: Diagnosis not present

## 2019-04-30 DIAGNOSIS — Z9181 History of falling: Secondary | ICD-10-CM | POA: Diagnosis not present

## 2019-04-30 DIAGNOSIS — N183 Chronic kidney disease, stage 3 (moderate): Secondary | ICD-10-CM | POA: Diagnosis not present

## 2019-04-30 DIAGNOSIS — M75101 Unspecified rotator cuff tear or rupture of right shoulder, not specified as traumatic: Secondary | ICD-10-CM | POA: Diagnosis not present

## 2019-04-30 DIAGNOSIS — K219 Gastro-esophageal reflux disease without esophagitis: Secondary | ICD-10-CM | POA: Diagnosis not present

## 2019-04-30 DIAGNOSIS — G894 Chronic pain syndrome: Secondary | ICD-10-CM | POA: Diagnosis not present

## 2019-04-30 DIAGNOSIS — G629 Polyneuropathy, unspecified: Secondary | ICD-10-CM | POA: Diagnosis not present

## 2019-04-30 DIAGNOSIS — M1990 Unspecified osteoarthritis, unspecified site: Secondary | ICD-10-CM | POA: Diagnosis not present

## 2019-04-30 DIAGNOSIS — B0229 Other postherpetic nervous system involvement: Secondary | ICD-10-CM | POA: Diagnosis not present

## 2019-05-03 DIAGNOSIS — D631 Anemia in chronic kidney disease: Secondary | ICD-10-CM | POA: Diagnosis not present

## 2019-05-03 DIAGNOSIS — I129 Hypertensive chronic kidney disease with stage 1 through stage 4 chronic kidney disease, or unspecified chronic kidney disease: Secondary | ICD-10-CM | POA: Diagnosis not present

## 2019-05-03 DIAGNOSIS — E039 Hypothyroidism, unspecified: Secondary | ICD-10-CM | POA: Diagnosis not present

## 2019-05-03 DIAGNOSIS — G894 Chronic pain syndrome: Secondary | ICD-10-CM | POA: Diagnosis not present

## 2019-05-03 DIAGNOSIS — G629 Polyneuropathy, unspecified: Secondary | ICD-10-CM | POA: Diagnosis not present

## 2019-05-03 DIAGNOSIS — Z9181 History of falling: Secondary | ICD-10-CM | POA: Diagnosis not present

## 2019-05-03 DIAGNOSIS — M75101 Unspecified rotator cuff tear or rupture of right shoulder, not specified as traumatic: Secondary | ICD-10-CM | POA: Diagnosis not present

## 2019-05-03 DIAGNOSIS — K219 Gastro-esophageal reflux disease without esophagitis: Secondary | ICD-10-CM | POA: Diagnosis not present

## 2019-05-03 DIAGNOSIS — M5136 Other intervertebral disc degeneration, lumbar region: Secondary | ICD-10-CM | POA: Diagnosis not present

## 2019-05-03 DIAGNOSIS — B0229 Other postherpetic nervous system involvement: Secondary | ICD-10-CM | POA: Diagnosis not present

## 2019-05-03 DIAGNOSIS — J479 Bronchiectasis, uncomplicated: Secondary | ICD-10-CM | POA: Diagnosis not present

## 2019-05-03 DIAGNOSIS — M1990 Unspecified osteoarthritis, unspecified site: Secondary | ICD-10-CM | POA: Diagnosis not present

## 2019-05-03 DIAGNOSIS — N183 Chronic kidney disease, stage 3 (moderate): Secondary | ICD-10-CM | POA: Diagnosis not present

## 2019-05-04 DIAGNOSIS — M75101 Unspecified rotator cuff tear or rupture of right shoulder, not specified as traumatic: Secondary | ICD-10-CM | POA: Diagnosis not present

## 2019-05-04 DIAGNOSIS — G629 Polyneuropathy, unspecified: Secondary | ICD-10-CM | POA: Diagnosis not present

## 2019-05-04 DIAGNOSIS — M5136 Other intervertebral disc degeneration, lumbar region: Secondary | ICD-10-CM | POA: Diagnosis not present

## 2019-05-04 DIAGNOSIS — B0229 Other postherpetic nervous system involvement: Secondary | ICD-10-CM | POA: Diagnosis not present

## 2019-05-06 DIAGNOSIS — M1990 Unspecified osteoarthritis, unspecified site: Secondary | ICD-10-CM | POA: Diagnosis not present

## 2019-05-06 DIAGNOSIS — D631 Anemia in chronic kidney disease: Secondary | ICD-10-CM | POA: Diagnosis not present

## 2019-05-06 DIAGNOSIS — G894 Chronic pain syndrome: Secondary | ICD-10-CM | POA: Diagnosis not present

## 2019-05-06 DIAGNOSIS — K219 Gastro-esophageal reflux disease without esophagitis: Secondary | ICD-10-CM | POA: Diagnosis not present

## 2019-05-06 DIAGNOSIS — I129 Hypertensive chronic kidney disease with stage 1 through stage 4 chronic kidney disease, or unspecified chronic kidney disease: Secondary | ICD-10-CM | POA: Diagnosis not present

## 2019-05-06 DIAGNOSIS — G629 Polyneuropathy, unspecified: Secondary | ICD-10-CM | POA: Diagnosis not present

## 2019-05-06 DIAGNOSIS — Z9181 History of falling: Secondary | ICD-10-CM | POA: Diagnosis not present

## 2019-05-06 DIAGNOSIS — M75101 Unspecified rotator cuff tear or rupture of right shoulder, not specified as traumatic: Secondary | ICD-10-CM | POA: Diagnosis not present

## 2019-05-06 DIAGNOSIS — E039 Hypothyroidism, unspecified: Secondary | ICD-10-CM | POA: Diagnosis not present

## 2019-05-06 DIAGNOSIS — M5136 Other intervertebral disc degeneration, lumbar region: Secondary | ICD-10-CM | POA: Diagnosis not present

## 2019-05-06 DIAGNOSIS — B0229 Other postherpetic nervous system involvement: Secondary | ICD-10-CM | POA: Diagnosis not present

## 2019-05-06 DIAGNOSIS — J479 Bronchiectasis, uncomplicated: Secondary | ICD-10-CM | POA: Diagnosis not present

## 2019-05-06 DIAGNOSIS — N183 Chronic kidney disease, stage 3 (moderate): Secondary | ICD-10-CM | POA: Diagnosis not present

## 2019-05-07 DIAGNOSIS — M5136 Other intervertebral disc degeneration, lumbar region: Secondary | ICD-10-CM | POA: Diagnosis not present

## 2019-05-07 DIAGNOSIS — B0229 Other postherpetic nervous system involvement: Secondary | ICD-10-CM | POA: Diagnosis not present

## 2019-05-07 DIAGNOSIS — I129 Hypertensive chronic kidney disease with stage 1 through stage 4 chronic kidney disease, or unspecified chronic kidney disease: Secondary | ICD-10-CM | POA: Diagnosis not present

## 2019-05-07 DIAGNOSIS — K219 Gastro-esophageal reflux disease without esophagitis: Secondary | ICD-10-CM | POA: Diagnosis not present

## 2019-05-07 DIAGNOSIS — J479 Bronchiectasis, uncomplicated: Secondary | ICD-10-CM | POA: Diagnosis not present

## 2019-05-07 DIAGNOSIS — M1990 Unspecified osteoarthritis, unspecified site: Secondary | ICD-10-CM | POA: Diagnosis not present

## 2019-05-07 DIAGNOSIS — Z9181 History of falling: Secondary | ICD-10-CM | POA: Diagnosis not present

## 2019-05-07 DIAGNOSIS — G894 Chronic pain syndrome: Secondary | ICD-10-CM | POA: Diagnosis not present

## 2019-05-07 DIAGNOSIS — E039 Hypothyroidism, unspecified: Secondary | ICD-10-CM | POA: Diagnosis not present

## 2019-05-07 DIAGNOSIS — D631 Anemia in chronic kidney disease: Secondary | ICD-10-CM | POA: Diagnosis not present

## 2019-05-07 DIAGNOSIS — G629 Polyneuropathy, unspecified: Secondary | ICD-10-CM | POA: Diagnosis not present

## 2019-05-07 DIAGNOSIS — M75101 Unspecified rotator cuff tear or rupture of right shoulder, not specified as traumatic: Secondary | ICD-10-CM | POA: Diagnosis not present

## 2019-05-07 DIAGNOSIS — N183 Chronic kidney disease, stage 3 (moderate): Secondary | ICD-10-CM | POA: Diagnosis not present

## 2019-05-12 DIAGNOSIS — G629 Polyneuropathy, unspecified: Secondary | ICD-10-CM | POA: Diagnosis not present

## 2019-05-12 DIAGNOSIS — D631 Anemia in chronic kidney disease: Secondary | ICD-10-CM | POA: Diagnosis not present

## 2019-05-12 DIAGNOSIS — M1990 Unspecified osteoarthritis, unspecified site: Secondary | ICD-10-CM | POA: Diagnosis not present

## 2019-05-12 DIAGNOSIS — N183 Chronic kidney disease, stage 3 (moderate): Secondary | ICD-10-CM | POA: Diagnosis not present

## 2019-05-12 DIAGNOSIS — J479 Bronchiectasis, uncomplicated: Secondary | ICD-10-CM | POA: Diagnosis not present

## 2019-05-12 DIAGNOSIS — M5136 Other intervertebral disc degeneration, lumbar region: Secondary | ICD-10-CM | POA: Diagnosis not present

## 2019-05-12 DIAGNOSIS — M75101 Unspecified rotator cuff tear or rupture of right shoulder, not specified as traumatic: Secondary | ICD-10-CM | POA: Diagnosis not present

## 2019-05-12 DIAGNOSIS — G894 Chronic pain syndrome: Secondary | ICD-10-CM | POA: Diagnosis not present

## 2019-05-12 DIAGNOSIS — B0229 Other postherpetic nervous system involvement: Secondary | ICD-10-CM | POA: Diagnosis not present

## 2019-05-12 DIAGNOSIS — K219 Gastro-esophageal reflux disease without esophagitis: Secondary | ICD-10-CM | POA: Diagnosis not present

## 2019-05-12 DIAGNOSIS — Z9181 History of falling: Secondary | ICD-10-CM | POA: Diagnosis not present

## 2019-05-12 DIAGNOSIS — I129 Hypertensive chronic kidney disease with stage 1 through stage 4 chronic kidney disease, or unspecified chronic kidney disease: Secondary | ICD-10-CM | POA: Diagnosis not present

## 2019-05-12 DIAGNOSIS — E039 Hypothyroidism, unspecified: Secondary | ICD-10-CM | POA: Diagnosis not present

## 2019-05-14 DIAGNOSIS — J479 Bronchiectasis, uncomplicated: Secondary | ICD-10-CM | POA: Diagnosis not present

## 2019-05-14 DIAGNOSIS — G894 Chronic pain syndrome: Secondary | ICD-10-CM | POA: Diagnosis not present

## 2019-05-14 DIAGNOSIS — Z9181 History of falling: Secondary | ICD-10-CM | POA: Diagnosis not present

## 2019-05-14 DIAGNOSIS — N183 Chronic kidney disease, stage 3 (moderate): Secondary | ICD-10-CM | POA: Diagnosis not present

## 2019-05-14 DIAGNOSIS — D631 Anemia in chronic kidney disease: Secondary | ICD-10-CM | POA: Diagnosis not present

## 2019-05-14 DIAGNOSIS — B0229 Other postherpetic nervous system involvement: Secondary | ICD-10-CM | POA: Diagnosis not present

## 2019-05-14 DIAGNOSIS — M5136 Other intervertebral disc degeneration, lumbar region: Secondary | ICD-10-CM | POA: Diagnosis not present

## 2019-05-14 DIAGNOSIS — M75101 Unspecified rotator cuff tear or rupture of right shoulder, not specified as traumatic: Secondary | ICD-10-CM | POA: Diagnosis not present

## 2019-05-14 DIAGNOSIS — M1990 Unspecified osteoarthritis, unspecified site: Secondary | ICD-10-CM | POA: Diagnosis not present

## 2019-05-14 DIAGNOSIS — G629 Polyneuropathy, unspecified: Secondary | ICD-10-CM | POA: Diagnosis not present

## 2019-05-14 DIAGNOSIS — E039 Hypothyroidism, unspecified: Secondary | ICD-10-CM | POA: Diagnosis not present

## 2019-05-14 DIAGNOSIS — I129 Hypertensive chronic kidney disease with stage 1 through stage 4 chronic kidney disease, or unspecified chronic kidney disease: Secondary | ICD-10-CM | POA: Diagnosis not present

## 2019-05-14 DIAGNOSIS — K219 Gastro-esophageal reflux disease without esophagitis: Secondary | ICD-10-CM | POA: Diagnosis not present

## 2019-05-19 DIAGNOSIS — D631 Anemia in chronic kidney disease: Secondary | ICD-10-CM | POA: Diagnosis not present

## 2019-05-19 DIAGNOSIS — K219 Gastro-esophageal reflux disease without esophagitis: Secondary | ICD-10-CM | POA: Diagnosis not present

## 2019-05-19 DIAGNOSIS — M1990 Unspecified osteoarthritis, unspecified site: Secondary | ICD-10-CM | POA: Diagnosis not present

## 2019-05-19 DIAGNOSIS — M5136 Other intervertebral disc degeneration, lumbar region: Secondary | ICD-10-CM | POA: Diagnosis not present

## 2019-05-19 DIAGNOSIS — N183 Chronic kidney disease, stage 3 (moderate): Secondary | ICD-10-CM | POA: Diagnosis not present

## 2019-05-19 DIAGNOSIS — G629 Polyneuropathy, unspecified: Secondary | ICD-10-CM | POA: Diagnosis not present

## 2019-05-19 DIAGNOSIS — I129 Hypertensive chronic kidney disease with stage 1 through stage 4 chronic kidney disease, or unspecified chronic kidney disease: Secondary | ICD-10-CM | POA: Diagnosis not present

## 2019-05-19 DIAGNOSIS — J479 Bronchiectasis, uncomplicated: Secondary | ICD-10-CM | POA: Diagnosis not present

## 2019-05-19 DIAGNOSIS — Z9181 History of falling: Secondary | ICD-10-CM | POA: Diagnosis not present

## 2019-05-19 DIAGNOSIS — M75101 Unspecified rotator cuff tear or rupture of right shoulder, not specified as traumatic: Secondary | ICD-10-CM | POA: Diagnosis not present

## 2019-05-19 DIAGNOSIS — E039 Hypothyroidism, unspecified: Secondary | ICD-10-CM | POA: Diagnosis not present

## 2019-05-19 DIAGNOSIS — G894 Chronic pain syndrome: Secondary | ICD-10-CM | POA: Diagnosis not present

## 2019-05-19 DIAGNOSIS — B0229 Other postherpetic nervous system involvement: Secondary | ICD-10-CM | POA: Diagnosis not present

## 2019-05-20 DIAGNOSIS — M1990 Unspecified osteoarthritis, unspecified site: Secondary | ICD-10-CM | POA: Diagnosis not present

## 2019-05-20 DIAGNOSIS — J479 Bronchiectasis, uncomplicated: Secondary | ICD-10-CM | POA: Diagnosis not present

## 2019-05-20 DIAGNOSIS — B0229 Other postherpetic nervous system involvement: Secondary | ICD-10-CM | POA: Diagnosis not present

## 2019-05-20 DIAGNOSIS — G629 Polyneuropathy, unspecified: Secondary | ICD-10-CM | POA: Diagnosis not present

## 2019-05-20 DIAGNOSIS — M5136 Other intervertebral disc degeneration, lumbar region: Secondary | ICD-10-CM | POA: Diagnosis not present

## 2019-05-20 DIAGNOSIS — K219 Gastro-esophageal reflux disease without esophagitis: Secondary | ICD-10-CM | POA: Diagnosis not present

## 2019-05-20 DIAGNOSIS — N183 Chronic kidney disease, stage 3 (moderate): Secondary | ICD-10-CM | POA: Diagnosis not present

## 2019-05-20 DIAGNOSIS — M75101 Unspecified rotator cuff tear or rupture of right shoulder, not specified as traumatic: Secondary | ICD-10-CM | POA: Diagnosis not present

## 2019-05-20 DIAGNOSIS — D631 Anemia in chronic kidney disease: Secondary | ICD-10-CM | POA: Diagnosis not present

## 2019-05-20 DIAGNOSIS — E039 Hypothyroidism, unspecified: Secondary | ICD-10-CM | POA: Diagnosis not present

## 2019-05-20 DIAGNOSIS — G894 Chronic pain syndrome: Secondary | ICD-10-CM | POA: Diagnosis not present

## 2019-05-20 DIAGNOSIS — I129 Hypertensive chronic kidney disease with stage 1 through stage 4 chronic kidney disease, or unspecified chronic kidney disease: Secondary | ICD-10-CM | POA: Diagnosis not present

## 2019-05-20 DIAGNOSIS — Z9181 History of falling: Secondary | ICD-10-CM | POA: Diagnosis not present

## 2019-05-21 DIAGNOSIS — N183 Chronic kidney disease, stage 3 (moderate): Secondary | ICD-10-CM | POA: Diagnosis not present

## 2019-05-21 DIAGNOSIS — M1990 Unspecified osteoarthritis, unspecified site: Secondary | ICD-10-CM | POA: Diagnosis not present

## 2019-05-21 DIAGNOSIS — G629 Polyneuropathy, unspecified: Secondary | ICD-10-CM | POA: Diagnosis not present

## 2019-05-21 DIAGNOSIS — M5136 Other intervertebral disc degeneration, lumbar region: Secondary | ICD-10-CM | POA: Diagnosis not present

## 2019-05-21 DIAGNOSIS — G894 Chronic pain syndrome: Secondary | ICD-10-CM | POA: Diagnosis not present

## 2019-05-21 DIAGNOSIS — E039 Hypothyroidism, unspecified: Secondary | ICD-10-CM | POA: Diagnosis not present

## 2019-05-21 DIAGNOSIS — J479 Bronchiectasis, uncomplicated: Secondary | ICD-10-CM | POA: Diagnosis not present

## 2019-05-21 DIAGNOSIS — Z9181 History of falling: Secondary | ICD-10-CM | POA: Diagnosis not present

## 2019-05-21 DIAGNOSIS — I129 Hypertensive chronic kidney disease with stage 1 through stage 4 chronic kidney disease, or unspecified chronic kidney disease: Secondary | ICD-10-CM | POA: Diagnosis not present

## 2019-05-21 DIAGNOSIS — M75101 Unspecified rotator cuff tear or rupture of right shoulder, not specified as traumatic: Secondary | ICD-10-CM | POA: Diagnosis not present

## 2019-05-21 DIAGNOSIS — D631 Anemia in chronic kidney disease: Secondary | ICD-10-CM | POA: Diagnosis not present

## 2019-05-21 DIAGNOSIS — K219 Gastro-esophageal reflux disease without esophagitis: Secondary | ICD-10-CM | POA: Diagnosis not present

## 2019-05-21 DIAGNOSIS — B0229 Other postherpetic nervous system involvement: Secondary | ICD-10-CM | POA: Diagnosis not present

## 2019-05-26 DIAGNOSIS — B0229 Other postherpetic nervous system involvement: Secondary | ICD-10-CM | POA: Diagnosis not present

## 2019-05-26 DIAGNOSIS — M75101 Unspecified rotator cuff tear or rupture of right shoulder, not specified as traumatic: Secondary | ICD-10-CM | POA: Diagnosis not present

## 2019-05-26 DIAGNOSIS — G894 Chronic pain syndrome: Secondary | ICD-10-CM | POA: Diagnosis not present

## 2019-05-26 DIAGNOSIS — I129 Hypertensive chronic kidney disease with stage 1 through stage 4 chronic kidney disease, or unspecified chronic kidney disease: Secondary | ICD-10-CM | POA: Diagnosis not present

## 2019-05-26 DIAGNOSIS — G629 Polyneuropathy, unspecified: Secondary | ICD-10-CM | POA: Diagnosis not present

## 2019-05-26 DIAGNOSIS — M1990 Unspecified osteoarthritis, unspecified site: Secondary | ICD-10-CM | POA: Diagnosis not present

## 2019-05-26 DIAGNOSIS — D631 Anemia in chronic kidney disease: Secondary | ICD-10-CM | POA: Diagnosis not present

## 2019-05-26 DIAGNOSIS — J479 Bronchiectasis, uncomplicated: Secondary | ICD-10-CM | POA: Diagnosis not present

## 2019-05-26 DIAGNOSIS — N183 Chronic kidney disease, stage 3 (moderate): Secondary | ICD-10-CM | POA: Diagnosis not present

## 2019-05-26 DIAGNOSIS — E039 Hypothyroidism, unspecified: Secondary | ICD-10-CM | POA: Diagnosis not present

## 2019-05-26 DIAGNOSIS — M5136 Other intervertebral disc degeneration, lumbar region: Secondary | ICD-10-CM | POA: Diagnosis not present

## 2019-05-26 DIAGNOSIS — Z9181 History of falling: Secondary | ICD-10-CM | POA: Diagnosis not present

## 2019-05-26 DIAGNOSIS — K219 Gastro-esophageal reflux disease without esophagitis: Secondary | ICD-10-CM | POA: Diagnosis not present

## 2019-06-01 DIAGNOSIS — D631 Anemia in chronic kidney disease: Secondary | ICD-10-CM | POA: Diagnosis not present

## 2019-06-01 DIAGNOSIS — M1990 Unspecified osteoarthritis, unspecified site: Secondary | ICD-10-CM | POA: Diagnosis not present

## 2019-06-01 DIAGNOSIS — E039 Hypothyroidism, unspecified: Secondary | ICD-10-CM | POA: Diagnosis not present

## 2019-06-01 DIAGNOSIS — N183 Chronic kidney disease, stage 3 (moderate): Secondary | ICD-10-CM | POA: Diagnosis not present

## 2019-06-01 DIAGNOSIS — M75101 Unspecified rotator cuff tear or rupture of right shoulder, not specified as traumatic: Secondary | ICD-10-CM | POA: Diagnosis not present

## 2019-06-01 DIAGNOSIS — M5136 Other intervertebral disc degeneration, lumbar region: Secondary | ICD-10-CM | POA: Diagnosis not present

## 2019-06-01 DIAGNOSIS — J479 Bronchiectasis, uncomplicated: Secondary | ICD-10-CM | POA: Diagnosis not present

## 2019-06-01 DIAGNOSIS — B0229 Other postherpetic nervous system involvement: Secondary | ICD-10-CM | POA: Diagnosis not present

## 2019-06-01 DIAGNOSIS — G894 Chronic pain syndrome: Secondary | ICD-10-CM | POA: Diagnosis not present

## 2019-06-01 DIAGNOSIS — I129 Hypertensive chronic kidney disease with stage 1 through stage 4 chronic kidney disease, or unspecified chronic kidney disease: Secondary | ICD-10-CM | POA: Diagnosis not present

## 2019-06-01 DIAGNOSIS — G629 Polyneuropathy, unspecified: Secondary | ICD-10-CM | POA: Diagnosis not present

## 2019-06-01 DIAGNOSIS — Z9181 History of falling: Secondary | ICD-10-CM | POA: Diagnosis not present

## 2019-06-01 DIAGNOSIS — K219 Gastro-esophageal reflux disease without esophagitis: Secondary | ICD-10-CM | POA: Diagnosis not present

## 2019-06-04 DIAGNOSIS — Z9181 History of falling: Secondary | ICD-10-CM | POA: Diagnosis not present

## 2019-06-04 DIAGNOSIS — M75101 Unspecified rotator cuff tear or rupture of right shoulder, not specified as traumatic: Secondary | ICD-10-CM | POA: Diagnosis not present

## 2019-06-04 DIAGNOSIS — K219 Gastro-esophageal reflux disease without esophagitis: Secondary | ICD-10-CM | POA: Diagnosis not present

## 2019-06-04 DIAGNOSIS — N183 Chronic kidney disease, stage 3 (moderate): Secondary | ICD-10-CM | POA: Diagnosis not present

## 2019-06-04 DIAGNOSIS — E039 Hypothyroidism, unspecified: Secondary | ICD-10-CM | POA: Diagnosis not present

## 2019-06-04 DIAGNOSIS — B0229 Other postherpetic nervous system involvement: Secondary | ICD-10-CM | POA: Diagnosis not present

## 2019-06-04 DIAGNOSIS — M5136 Other intervertebral disc degeneration, lumbar region: Secondary | ICD-10-CM | POA: Diagnosis not present

## 2019-06-04 DIAGNOSIS — G894 Chronic pain syndrome: Secondary | ICD-10-CM | POA: Diagnosis not present

## 2019-06-04 DIAGNOSIS — J479 Bronchiectasis, uncomplicated: Secondary | ICD-10-CM | POA: Diagnosis not present

## 2019-06-04 DIAGNOSIS — M1990 Unspecified osteoarthritis, unspecified site: Secondary | ICD-10-CM | POA: Diagnosis not present

## 2019-06-04 DIAGNOSIS — D631 Anemia in chronic kidney disease: Secondary | ICD-10-CM | POA: Diagnosis not present

## 2019-06-04 DIAGNOSIS — G629 Polyneuropathy, unspecified: Secondary | ICD-10-CM | POA: Diagnosis not present

## 2019-06-04 DIAGNOSIS — I129 Hypertensive chronic kidney disease with stage 1 through stage 4 chronic kidney disease, or unspecified chronic kidney disease: Secondary | ICD-10-CM | POA: Diagnosis not present

## 2019-06-08 DIAGNOSIS — M5136 Other intervertebral disc degeneration, lumbar region: Secondary | ICD-10-CM | POA: Diagnosis not present

## 2019-06-08 DIAGNOSIS — B0229 Other postherpetic nervous system involvement: Secondary | ICD-10-CM | POA: Diagnosis not present

## 2019-06-08 DIAGNOSIS — Z9181 History of falling: Secondary | ICD-10-CM | POA: Diagnosis not present

## 2019-06-08 DIAGNOSIS — G894 Chronic pain syndrome: Secondary | ICD-10-CM | POA: Diagnosis not present

## 2019-06-08 DIAGNOSIS — D631 Anemia in chronic kidney disease: Secondary | ICD-10-CM | POA: Diagnosis not present

## 2019-06-08 DIAGNOSIS — G629 Polyneuropathy, unspecified: Secondary | ICD-10-CM | POA: Diagnosis not present

## 2019-06-08 DIAGNOSIS — M75101 Unspecified rotator cuff tear or rupture of right shoulder, not specified as traumatic: Secondary | ICD-10-CM | POA: Diagnosis not present

## 2019-06-08 DIAGNOSIS — I129 Hypertensive chronic kidney disease with stage 1 through stage 4 chronic kidney disease, or unspecified chronic kidney disease: Secondary | ICD-10-CM | POA: Diagnosis not present

## 2019-06-08 DIAGNOSIS — K219 Gastro-esophageal reflux disease without esophagitis: Secondary | ICD-10-CM | POA: Diagnosis not present

## 2019-06-08 DIAGNOSIS — E039 Hypothyroidism, unspecified: Secondary | ICD-10-CM | POA: Diagnosis not present

## 2019-06-08 DIAGNOSIS — M1990 Unspecified osteoarthritis, unspecified site: Secondary | ICD-10-CM | POA: Diagnosis not present

## 2019-06-08 DIAGNOSIS — N183 Chronic kidney disease, stage 3 (moderate): Secondary | ICD-10-CM | POA: Diagnosis not present

## 2019-06-08 DIAGNOSIS — J479 Bronchiectasis, uncomplicated: Secondary | ICD-10-CM | POA: Diagnosis not present

## 2019-06-10 DIAGNOSIS — M75101 Unspecified rotator cuff tear or rupture of right shoulder, not specified as traumatic: Secondary | ICD-10-CM | POA: Diagnosis not present

## 2019-06-10 DIAGNOSIS — E039 Hypothyroidism, unspecified: Secondary | ICD-10-CM | POA: Diagnosis not present

## 2019-06-10 DIAGNOSIS — G629 Polyneuropathy, unspecified: Secondary | ICD-10-CM | POA: Diagnosis not present

## 2019-06-10 DIAGNOSIS — B0229 Other postherpetic nervous system involvement: Secondary | ICD-10-CM | POA: Diagnosis not present

## 2019-06-10 DIAGNOSIS — I129 Hypertensive chronic kidney disease with stage 1 through stage 4 chronic kidney disease, or unspecified chronic kidney disease: Secondary | ICD-10-CM | POA: Diagnosis not present

## 2019-06-10 DIAGNOSIS — N183 Chronic kidney disease, stage 3 (moderate): Secondary | ICD-10-CM | POA: Diagnosis not present

## 2019-06-10 DIAGNOSIS — M1990 Unspecified osteoarthritis, unspecified site: Secondary | ICD-10-CM | POA: Diagnosis not present

## 2019-06-10 DIAGNOSIS — J479 Bronchiectasis, uncomplicated: Secondary | ICD-10-CM | POA: Diagnosis not present

## 2019-06-10 DIAGNOSIS — Z9181 History of falling: Secondary | ICD-10-CM | POA: Diagnosis not present

## 2019-06-10 DIAGNOSIS — K219 Gastro-esophageal reflux disease without esophagitis: Secondary | ICD-10-CM | POA: Diagnosis not present

## 2019-06-10 DIAGNOSIS — D631 Anemia in chronic kidney disease: Secondary | ICD-10-CM | POA: Diagnosis not present

## 2019-06-10 DIAGNOSIS — M5136 Other intervertebral disc degeneration, lumbar region: Secondary | ICD-10-CM | POA: Diagnosis not present

## 2019-06-10 DIAGNOSIS — G894 Chronic pain syndrome: Secondary | ICD-10-CM | POA: Diagnosis not present

## 2019-06-15 DIAGNOSIS — M5136 Other intervertebral disc degeneration, lumbar region: Secondary | ICD-10-CM | POA: Diagnosis not present

## 2019-06-15 DIAGNOSIS — D631 Anemia in chronic kidney disease: Secondary | ICD-10-CM | POA: Diagnosis not present

## 2019-06-15 DIAGNOSIS — I129 Hypertensive chronic kidney disease with stage 1 through stage 4 chronic kidney disease, or unspecified chronic kidney disease: Secondary | ICD-10-CM | POA: Diagnosis not present

## 2019-06-15 DIAGNOSIS — B0229 Other postherpetic nervous system involvement: Secondary | ICD-10-CM | POA: Diagnosis not present

## 2019-06-15 DIAGNOSIS — G894 Chronic pain syndrome: Secondary | ICD-10-CM | POA: Diagnosis not present

## 2019-06-15 DIAGNOSIS — G629 Polyneuropathy, unspecified: Secondary | ICD-10-CM | POA: Diagnosis not present

## 2019-06-15 DIAGNOSIS — N183 Chronic kidney disease, stage 3 (moderate): Secondary | ICD-10-CM | POA: Diagnosis not present

## 2019-06-15 DIAGNOSIS — M1990 Unspecified osteoarthritis, unspecified site: Secondary | ICD-10-CM | POA: Diagnosis not present

## 2019-06-15 DIAGNOSIS — M75101 Unspecified rotator cuff tear or rupture of right shoulder, not specified as traumatic: Secondary | ICD-10-CM | POA: Diagnosis not present

## 2019-06-15 DIAGNOSIS — J479 Bronchiectasis, uncomplicated: Secondary | ICD-10-CM | POA: Diagnosis not present

## 2019-06-15 DIAGNOSIS — E039 Hypothyroidism, unspecified: Secondary | ICD-10-CM | POA: Diagnosis not present

## 2019-06-15 DIAGNOSIS — K219 Gastro-esophageal reflux disease without esophagitis: Secondary | ICD-10-CM | POA: Diagnosis not present

## 2019-06-15 DIAGNOSIS — Z9181 History of falling: Secondary | ICD-10-CM | POA: Diagnosis not present

## 2019-06-17 DIAGNOSIS — N183 Chronic kidney disease, stage 3 (moderate): Secondary | ICD-10-CM | POA: Diagnosis not present

## 2019-06-17 DIAGNOSIS — E039 Hypothyroidism, unspecified: Secondary | ICD-10-CM | POA: Diagnosis not present

## 2019-06-17 DIAGNOSIS — G629 Polyneuropathy, unspecified: Secondary | ICD-10-CM | POA: Diagnosis not present

## 2019-06-17 DIAGNOSIS — Z9181 History of falling: Secondary | ICD-10-CM | POA: Diagnosis not present

## 2019-06-17 DIAGNOSIS — M75101 Unspecified rotator cuff tear or rupture of right shoulder, not specified as traumatic: Secondary | ICD-10-CM | POA: Diagnosis not present

## 2019-06-17 DIAGNOSIS — G894 Chronic pain syndrome: Secondary | ICD-10-CM | POA: Diagnosis not present

## 2019-06-17 DIAGNOSIS — M1990 Unspecified osteoarthritis, unspecified site: Secondary | ICD-10-CM | POA: Diagnosis not present

## 2019-06-17 DIAGNOSIS — J479 Bronchiectasis, uncomplicated: Secondary | ICD-10-CM | POA: Diagnosis not present

## 2019-06-17 DIAGNOSIS — K219 Gastro-esophageal reflux disease without esophagitis: Secondary | ICD-10-CM | POA: Diagnosis not present

## 2019-06-17 DIAGNOSIS — B0229 Other postherpetic nervous system involvement: Secondary | ICD-10-CM | POA: Diagnosis not present

## 2019-06-17 DIAGNOSIS — D631 Anemia in chronic kidney disease: Secondary | ICD-10-CM | POA: Diagnosis not present

## 2019-06-17 DIAGNOSIS — M5136 Other intervertebral disc degeneration, lumbar region: Secondary | ICD-10-CM | POA: Diagnosis not present

## 2019-06-17 DIAGNOSIS — I129 Hypertensive chronic kidney disease with stage 1 through stage 4 chronic kidney disease, or unspecified chronic kidney disease: Secondary | ICD-10-CM | POA: Diagnosis not present

## 2019-06-18 DIAGNOSIS — G629 Polyneuropathy, unspecified: Secondary | ICD-10-CM | POA: Diagnosis not present

## 2019-06-18 DIAGNOSIS — Z9181 History of falling: Secondary | ICD-10-CM | POA: Diagnosis not present

## 2019-06-18 DIAGNOSIS — G894 Chronic pain syndrome: Secondary | ICD-10-CM | POA: Diagnosis not present

## 2019-06-18 DIAGNOSIS — M1990 Unspecified osteoarthritis, unspecified site: Secondary | ICD-10-CM | POA: Diagnosis not present

## 2019-06-18 DIAGNOSIS — B0229 Other postherpetic nervous system involvement: Secondary | ICD-10-CM | POA: Diagnosis not present

## 2019-06-18 DIAGNOSIS — D631 Anemia in chronic kidney disease: Secondary | ICD-10-CM | POA: Diagnosis not present

## 2019-06-18 DIAGNOSIS — K219 Gastro-esophageal reflux disease without esophagitis: Secondary | ICD-10-CM | POA: Diagnosis not present

## 2019-06-18 DIAGNOSIS — I129 Hypertensive chronic kidney disease with stage 1 through stage 4 chronic kidney disease, or unspecified chronic kidney disease: Secondary | ICD-10-CM | POA: Diagnosis not present

## 2019-06-18 DIAGNOSIS — M5136 Other intervertebral disc degeneration, lumbar region: Secondary | ICD-10-CM | POA: Diagnosis not present

## 2019-06-18 DIAGNOSIS — M75101 Unspecified rotator cuff tear or rupture of right shoulder, not specified as traumatic: Secondary | ICD-10-CM | POA: Diagnosis not present

## 2019-06-18 DIAGNOSIS — E039 Hypothyroidism, unspecified: Secondary | ICD-10-CM | POA: Diagnosis not present

## 2019-06-18 DIAGNOSIS — N183 Chronic kidney disease, stage 3 (moderate): Secondary | ICD-10-CM | POA: Diagnosis not present

## 2019-06-18 DIAGNOSIS — J479 Bronchiectasis, uncomplicated: Secondary | ICD-10-CM | POA: Diagnosis not present

## 2019-07-21 DIAGNOSIS — Z23 Encounter for immunization: Secondary | ICD-10-CM | POA: Diagnosis not present

## 2019-09-27 DIAGNOSIS — D631 Anemia in chronic kidney disease: Secondary | ICD-10-CM | POA: Diagnosis not present

## 2019-09-27 DIAGNOSIS — R600 Localized edema: Secondary | ICD-10-CM | POA: Diagnosis not present

## 2019-09-27 DIAGNOSIS — Z0000 Encounter for general adult medical examination without abnormal findings: Secondary | ICD-10-CM | POA: Diagnosis not present

## 2019-09-27 DIAGNOSIS — R609 Edema, unspecified: Secondary | ICD-10-CM | POA: Diagnosis not present

## 2019-09-27 DIAGNOSIS — E039 Hypothyroidism, unspecified: Secondary | ICD-10-CM | POA: Diagnosis not present

## 2019-09-27 DIAGNOSIS — Z23 Encounter for immunization: Secondary | ICD-10-CM | POA: Diagnosis not present

## 2019-09-27 DIAGNOSIS — N189 Chronic kidney disease, unspecified: Secondary | ICD-10-CM | POA: Diagnosis not present

## 2019-09-27 DIAGNOSIS — N183 Chronic kidney disease, stage 3 unspecified: Secondary | ICD-10-CM | POA: Diagnosis not present

## 2019-09-27 DIAGNOSIS — E785 Hyperlipidemia, unspecified: Secondary | ICD-10-CM | POA: Diagnosis not present

## 2019-09-27 DIAGNOSIS — N2581 Secondary hyperparathyroidism of renal origin: Secondary | ICD-10-CM | POA: Diagnosis not present

## 2019-10-04 DIAGNOSIS — N183 Chronic kidney disease, stage 3 unspecified: Secondary | ICD-10-CM | POA: Diagnosis not present

## 2019-10-04 DIAGNOSIS — E785 Hyperlipidemia, unspecified: Secondary | ICD-10-CM | POA: Diagnosis not present

## 2019-10-04 DIAGNOSIS — N2581 Secondary hyperparathyroidism of renal origin: Secondary | ICD-10-CM | POA: Diagnosis not present

## 2019-10-04 DIAGNOSIS — R609 Edema, unspecified: Secondary | ICD-10-CM | POA: Diagnosis not present

## 2019-10-04 DIAGNOSIS — Z87898 Personal history of other specified conditions: Secondary | ICD-10-CM | POA: Diagnosis not present

## 2019-10-13 ENCOUNTER — Encounter (HOSPITAL_COMMUNITY): Payer: Medicare Other

## 2019-10-18 ENCOUNTER — Other Ambulatory Visit (HOSPITAL_COMMUNITY): Payer: Self-pay | Admitting: *Deleted

## 2019-10-19 ENCOUNTER — Ambulatory Visit (HOSPITAL_COMMUNITY)
Admission: RE | Admit: 2019-10-19 | Discharge: 2019-10-19 | Disposition: A | Discharge status: Home / Self Care | Payer: Medicare Other | Source: Ambulatory Visit | Attending: Nephrology | Admitting: Nephrology

## 2019-10-19 ENCOUNTER — Other Ambulatory Visit: Payer: Self-pay

## 2019-10-19 DIAGNOSIS — N189 Chronic kidney disease, unspecified: Secondary | ICD-10-CM | POA: Diagnosis not present

## 2019-10-19 DIAGNOSIS — D631 Anemia in chronic kidney disease: Secondary | ICD-10-CM | POA: Insufficient documentation

## 2019-10-19 MED ORDER — SODIUM CHLORIDE 0.9 % IV SOLN
510.0000 mg | INTRAVENOUS | Status: DC
  Administered 2019-10-19: 510 mg via INTRAVENOUS
  Filled 2019-10-19: qty 17

## 2019-10-19 NOTE — Discharge Instructions (Signed)

## 2019-10-21 DIAGNOSIS — B0229 Other postherpetic nervous system involvement: Secondary | ICD-10-CM | POA: Diagnosis not present

## 2019-10-21 DIAGNOSIS — I10 Essential (primary) hypertension: Secondary | ICD-10-CM | POA: Diagnosis not present

## 2019-10-21 DIAGNOSIS — E039 Hypothyroidism, unspecified: Secondary | ICD-10-CM | POA: Diagnosis not present

## 2019-10-21 DIAGNOSIS — Z0000 Encounter for general adult medical examination without abnormal findings: Secondary | ICD-10-CM | POA: Diagnosis not present

## 2019-10-26 ENCOUNTER — Other Ambulatory Visit: Payer: Self-pay

## 2019-10-26 ENCOUNTER — Ambulatory Visit (HOSPITAL_COMMUNITY)
Admission: RE | Admit: 2019-10-26 | Discharge: 2019-10-26 | Disposition: A | Discharge status: Home / Self Care | Payer: Medicare Other | Source: Ambulatory Visit | Attending: Nephrology | Admitting: Nephrology

## 2019-10-26 DIAGNOSIS — D631 Anemia in chronic kidney disease: Secondary | ICD-10-CM | POA: Diagnosis not present

## 2019-10-26 DIAGNOSIS — D693 Immune thrombocytopenic purpura: Secondary | ICD-10-CM | POA: Diagnosis not present

## 2019-10-26 DIAGNOSIS — Z87898 Personal history of other specified conditions: Secondary | ICD-10-CM | POA: Diagnosis not present

## 2019-10-26 DIAGNOSIS — N2581 Secondary hyperparathyroidism of renal origin: Secondary | ICD-10-CM | POA: Diagnosis not present

## 2019-10-26 DIAGNOSIS — N189 Chronic kidney disease, unspecified: Secondary | ICD-10-CM | POA: Diagnosis not present

## 2019-10-26 DIAGNOSIS — N183 Chronic kidney disease, stage 3 unspecified: Secondary | ICD-10-CM | POA: Diagnosis not present

## 2019-10-26 DIAGNOSIS — R609 Edema, unspecified: Secondary | ICD-10-CM | POA: Diagnosis not present

## 2019-10-26 DIAGNOSIS — E785 Hyperlipidemia, unspecified: Secondary | ICD-10-CM | POA: Diagnosis not present

## 2019-10-26 MED ORDER — SODIUM CHLORIDE 0.9 % IV SOLN
510.0000 mg | INTRAVENOUS | Status: DC
  Administered 2019-10-26: 510 mg via INTRAVENOUS
  Filled 2019-10-26: qty 17

## 2019-11-29 DIAGNOSIS — R195 Other fecal abnormalities: Secondary | ICD-10-CM | POA: Diagnosis not present

## 2019-11-29 DIAGNOSIS — I10 Essential (primary) hypertension: Secondary | ICD-10-CM | POA: Diagnosis not present

## 2019-11-29 DIAGNOSIS — R110 Nausea: Secondary | ICD-10-CM | POA: Diagnosis not present

## 2019-11-29 DIAGNOSIS — E039 Hypothyroidism, unspecified: Secondary | ICD-10-CM | POA: Diagnosis not present

## 2019-12-14 DIAGNOSIS — E785 Hyperlipidemia, unspecified: Secondary | ICD-10-CM | POA: Diagnosis not present

## 2019-12-14 DIAGNOSIS — Z87898 Personal history of other specified conditions: Secondary | ICD-10-CM | POA: Diagnosis not present

## 2019-12-14 DIAGNOSIS — N2581 Secondary hyperparathyroidism of renal origin: Secondary | ICD-10-CM | POA: Diagnosis not present

## 2019-12-14 DIAGNOSIS — N189 Chronic kidney disease, unspecified: Secondary | ICD-10-CM | POA: Diagnosis not present

## 2019-12-14 DIAGNOSIS — N183 Chronic kidney disease, stage 3 unspecified: Secondary | ICD-10-CM | POA: Diagnosis not present

## 2019-12-14 DIAGNOSIS — R609 Edema, unspecified: Secondary | ICD-10-CM | POA: Diagnosis not present

## 2019-12-15 DIAGNOSIS — Z7189 Other specified counseling: Secondary | ICD-10-CM | POA: Diagnosis not present

## 2019-12-15 DIAGNOSIS — K915 Postcholecystectomy syndrome: Secondary | ICD-10-CM | POA: Diagnosis not present

## 2020-03-02 DIAGNOSIS — I10 Essential (primary) hypertension: Secondary | ICD-10-CM | POA: Diagnosis not present

## 2020-03-02 DIAGNOSIS — M79672 Pain in left foot: Secondary | ICD-10-CM | POA: Diagnosis not present

## 2020-03-02 DIAGNOSIS — S99922A Unspecified injury of left foot, initial encounter: Secondary | ICD-10-CM | POA: Diagnosis not present

## 2020-05-10 DIAGNOSIS — N189 Chronic kidney disease, unspecified: Secondary | ICD-10-CM | POA: Diagnosis not present

## 2020-05-10 DIAGNOSIS — N183 Chronic kidney disease, stage 3 unspecified: Secondary | ICD-10-CM | POA: Diagnosis not present

## 2020-05-10 DIAGNOSIS — Z87898 Personal history of other specified conditions: Secondary | ICD-10-CM | POA: Diagnosis not present

## 2020-05-10 DIAGNOSIS — N2581 Secondary hyperparathyroidism of renal origin: Secondary | ICD-10-CM | POA: Diagnosis not present

## 2020-05-10 DIAGNOSIS — E785 Hyperlipidemia, unspecified: Secondary | ICD-10-CM | POA: Diagnosis not present

## 2020-05-10 DIAGNOSIS — R609 Edema, unspecified: Secondary | ICD-10-CM | POA: Diagnosis not present

## 2020-05-18 DIAGNOSIS — M1990 Unspecified osteoarthritis, unspecified site: Secondary | ICD-10-CM | POA: Diagnosis not present

## 2020-05-18 DIAGNOSIS — E039 Hypothyroidism, unspecified: Secondary | ICD-10-CM | POA: Diagnosis not present

## 2020-05-18 DIAGNOSIS — B0229 Other postherpetic nervous system involvement: Secondary | ICD-10-CM | POA: Diagnosis not present

## 2020-05-18 DIAGNOSIS — Z23 Encounter for immunization: Secondary | ICD-10-CM | POA: Diagnosis not present

## 2020-05-18 DIAGNOSIS — I129 Hypertensive chronic kidney disease with stage 1 through stage 4 chronic kidney disease, or unspecified chronic kidney disease: Secondary | ICD-10-CM | POA: Diagnosis not present

## 2020-05-31 DIAGNOSIS — E039 Hypothyroidism, unspecified: Secondary | ICD-10-CM | POA: Diagnosis not present

## 2020-08-24 DIAGNOSIS — J309 Allergic rhinitis, unspecified: Secondary | ICD-10-CM | POA: Diagnosis not present

## 2020-08-24 DIAGNOSIS — I129 Hypertensive chronic kidney disease with stage 1 through stage 4 chronic kidney disease, or unspecified chronic kidney disease: Secondary | ICD-10-CM | POA: Diagnosis not present

## 2020-08-24 DIAGNOSIS — Z862 Personal history of diseases of the blood and blood-forming organs and certain disorders involving the immune mechanism: Secondary | ICD-10-CM | POA: Diagnosis not present

## 2020-08-24 DIAGNOSIS — Z23 Encounter for immunization: Secondary | ICD-10-CM | POA: Diagnosis not present

## 2020-08-24 DIAGNOSIS — E039 Hypothyroidism, unspecified: Secondary | ICD-10-CM | POA: Diagnosis not present

## 2020-10-19 DIAGNOSIS — N390 Urinary tract infection, site not specified: Secondary | ICD-10-CM | POA: Diagnosis not present

## 2021-02-22 DIAGNOSIS — Z0000 Encounter for general adult medical examination without abnormal findings: Secondary | ICD-10-CM | POA: Diagnosis not present

## 2021-02-22 DIAGNOSIS — N390 Urinary tract infection, site not specified: Secondary | ICD-10-CM | POA: Diagnosis not present

## 2021-02-22 DIAGNOSIS — D638 Anemia in other chronic diseases classified elsewhere: Secondary | ICD-10-CM | POA: Diagnosis not present

## 2021-02-22 DIAGNOSIS — E7800 Pure hypercholesterolemia, unspecified: Secondary | ICD-10-CM | POA: Diagnosis not present

## 2021-02-22 DIAGNOSIS — Z87440 Personal history of urinary (tract) infections: Secondary | ICD-10-CM | POA: Diagnosis not present

## 2021-02-22 DIAGNOSIS — I129 Hypertensive chronic kidney disease with stage 1 through stage 4 chronic kidney disease, or unspecified chronic kidney disease: Secondary | ICD-10-CM | POA: Diagnosis not present

## 2021-02-22 DIAGNOSIS — J309 Allergic rhinitis, unspecified: Secondary | ICD-10-CM | POA: Diagnosis not present

## 2021-02-22 DIAGNOSIS — R7309 Other abnormal glucose: Secondary | ICD-10-CM | POA: Diagnosis not present

## 2021-02-22 DIAGNOSIS — E559 Vitamin D deficiency, unspecified: Secondary | ICD-10-CM | POA: Diagnosis not present

## 2021-02-22 DIAGNOSIS — Z862 Personal history of diseases of the blood and blood-forming organs and certain disorders involving the immune mechanism: Secondary | ICD-10-CM | POA: Diagnosis not present

## 2021-02-22 DIAGNOSIS — E039 Hypothyroidism, unspecified: Secondary | ICD-10-CM | POA: Diagnosis not present

## 2021-02-22 DIAGNOSIS — B0229 Other postherpetic nervous system involvement: Secondary | ICD-10-CM | POA: Diagnosis not present

## 2021-05-17 DIAGNOSIS — E039 Hypothyroidism, unspecified: Secondary | ICD-10-CM | POA: Diagnosis not present

## 2021-07-18 DIAGNOSIS — E039 Hypothyroidism, unspecified: Secondary | ICD-10-CM | POA: Diagnosis not present

## 2021-08-29 DIAGNOSIS — E559 Vitamin D deficiency, unspecified: Secondary | ICD-10-CM | POA: Diagnosis not present

## 2021-08-29 DIAGNOSIS — Z23 Encounter for immunization: Secondary | ICD-10-CM | POA: Diagnosis not present

## 2021-08-29 DIAGNOSIS — E039 Hypothyroidism, unspecified: Secondary | ICD-10-CM | POA: Diagnosis not present

## 2021-08-29 DIAGNOSIS — I129 Hypertensive chronic kidney disease with stage 1 through stage 4 chronic kidney disease, or unspecified chronic kidney disease: Secondary | ICD-10-CM | POA: Diagnosis not present

## 2021-08-29 DIAGNOSIS — D638 Anemia in other chronic diseases classified elsewhere: Secondary | ICD-10-CM | POA: Diagnosis not present

## 2021-08-29 DIAGNOSIS — N1831 Chronic kidney disease, stage 3a: Secondary | ICD-10-CM | POA: Diagnosis not present

## 2021-12-21 ENCOUNTER — Other Ambulatory Visit: Payer: Self-pay | Admitting: Family Medicine

## 2021-12-24 ENCOUNTER — Other Ambulatory Visit: Payer: Self-pay | Admitting: Family Medicine

## 2021-12-24 DIAGNOSIS — N1831 Chronic kidney disease, stage 3a: Secondary | ICD-10-CM

## 2021-12-24 DIAGNOSIS — R109 Unspecified abdominal pain: Secondary | ICD-10-CM

## 2021-12-24 DIAGNOSIS — R634 Abnormal weight loss: Secondary | ICD-10-CM

## 2021-12-24 DIAGNOSIS — R630 Anorexia: Secondary | ICD-10-CM

## 2021-12-25 ENCOUNTER — Ambulatory Visit
Admission: RE | Admit: 2021-12-25 | Discharge: 2021-12-25 | Disposition: A | Discharge status: Home / Self Care | Payer: Medicare Other | Source: Ambulatory Visit | Attending: Family Medicine | Admitting: Family Medicine

## 2021-12-25 ENCOUNTER — Other Ambulatory Visit: Payer: Self-pay | Admitting: Family Medicine

## 2021-12-25 DIAGNOSIS — N1831 Chronic kidney disease, stage 3a: Secondary | ICD-10-CM

## 2021-12-25 DIAGNOSIS — R630 Anorexia: Secondary | ICD-10-CM

## 2021-12-25 DIAGNOSIS — R634 Abnormal weight loss: Secondary | ICD-10-CM

## 2021-12-25 DIAGNOSIS — R109 Unspecified abdominal pain: Secondary | ICD-10-CM

## 2022-10-09 DIAGNOSIS — B0229 Other postherpetic nervous system involvement: Secondary | ICD-10-CM | POA: Diagnosis not present

## 2022-10-09 DIAGNOSIS — D631 Anemia in chronic kidney disease: Secondary | ICD-10-CM | POA: Diagnosis not present

## 2022-10-09 DIAGNOSIS — Z79899 Other long term (current) drug therapy: Secondary | ICD-10-CM | POA: Diagnosis not present

## 2022-10-09 DIAGNOSIS — I130 Hypertensive heart and chronic kidney disease with heart failure and stage 1 through stage 4 chronic kidney disease, or unspecified chronic kidney disease: Secondary | ICD-10-CM | POA: Diagnosis not present

## 2022-10-09 DIAGNOSIS — N1832 Chronic kidney disease, stage 3b: Secondary | ICD-10-CM | POA: Diagnosis not present

## 2022-10-09 DIAGNOSIS — E559 Vitamin D deficiency, unspecified: Secondary | ICD-10-CM | POA: Diagnosis not present

## 2022-10-09 DIAGNOSIS — E039 Hypothyroidism, unspecified: Secondary | ICD-10-CM | POA: Diagnosis not present

## 2022-10-09 DIAGNOSIS — E876 Hypokalemia: Secondary | ICD-10-CM | POA: Diagnosis not present

## 2022-10-09 DIAGNOSIS — Z1159 Encounter for screening for other viral diseases: Secondary | ICD-10-CM | POA: Diagnosis not present

## 2022-10-30 ENCOUNTER — Other Ambulatory Visit: Payer: Self-pay | Admitting: Registered Nurse

## 2022-10-30 DIAGNOSIS — E2839 Other primary ovarian failure: Secondary | ICD-10-CM

## 2022-11-20 ENCOUNTER — Ambulatory Visit (INDEPENDENT_AMBULATORY_CARE_PROVIDER_SITE_OTHER): Payer: 59 | Admitting: Podiatry

## 2022-11-20 DIAGNOSIS — M79674 Pain in right toe(s): Secondary | ICD-10-CM

## 2022-11-20 DIAGNOSIS — B351 Tinea unguium: Secondary | ICD-10-CM | POA: Diagnosis not present

## 2022-11-20 DIAGNOSIS — M79675 Pain in left toe(s): Secondary | ICD-10-CM

## 2022-11-21 NOTE — Progress Notes (Signed)
  Subjective:  Patient ID: [name], female    DOB: 1941-01-27,  MRN: [id number]  Chief Complaint  Patient presents with   Nail Problem    (np)bil ingrown toenails ; Tinea unguium    82 y.o. female presents with the above complaint. History confirmed with patient.  Her nails are thickened elongated causing discomfort and pain  Objective:  Physical Exam: warm, good capillary refill, no trophic changes or ulcerative lesions, normal DP and PT pulses, and normal sensory exam. Left Foot: dystrophic yellowed discolored nail plates with subungual debris Right Foot: dystrophic yellowed discolored nail plates with subungual debris   Assessment:   1. Pain due to onychomycosis of toenails of both feet      Plan:  Patient was evaluated and treated and all questions answered.  Discussed the etiology and treatment options for the condition in detail with the patient. Educated patient on the topical and oral treatment options for mycotic nails. Recommended debridement of the nails today. Sharp and mechanical debridement performed of all painful and mycotic nails today. Nails debrided in length and thickness using a nail nipper to level of comfort. Discussed treatment options including appropriate shoe gear. Follow up as needed for painful nails.   Under the right hallux nail was a small subungual blister.  Following debridement this was evacuated there were no signs of infection purulence or ulceration.  I did apply triple antibiotic ointment and a Band-Aid and recommended she do the same at home for the next week and then leave open to air.  She will follow with me sooner if there are further issues  Return in about 3 months (around 02/19/2023) for painful thick fungal nails.

## 2022-12-20 ENCOUNTER — Other Ambulatory Visit: Payer: Medicare Other

## 2023-02-19 ENCOUNTER — Encounter: Payer: Self-pay | Admitting: Podiatry

## 2023-02-19 ENCOUNTER — Ambulatory Visit (INDEPENDENT_AMBULATORY_CARE_PROVIDER_SITE_OTHER): Payer: 59 | Admitting: Podiatry

## 2023-02-19 DIAGNOSIS — M79674 Pain in right toe(s): Secondary | ICD-10-CM

## 2023-02-19 DIAGNOSIS — M79675 Pain in left toe(s): Secondary | ICD-10-CM

## 2023-02-19 DIAGNOSIS — B351 Tinea unguium: Secondary | ICD-10-CM

## 2023-02-19 NOTE — Progress Notes (Signed)

## 2023-04-29 ENCOUNTER — Ambulatory Visit
Admission: RE | Admit: 2023-04-29 | Discharge: 2023-04-29 | Disposition: A | Discharge status: Home / Self Care | Payer: 59 | Source: Ambulatory Visit | Attending: Registered Nurse | Admitting: Registered Nurse

## 2023-04-29 DIAGNOSIS — E2839 Other primary ovarian failure: Secondary | ICD-10-CM

## 2023-04-30 ENCOUNTER — Ambulatory Visit: Payer: 59 | Admitting: Podiatry

## 2023-05-05 ENCOUNTER — Encounter: Payer: Self-pay | Admitting: Podiatry

## 2023-05-05 ENCOUNTER — Ambulatory Visit (INDEPENDENT_AMBULATORY_CARE_PROVIDER_SITE_OTHER): Payer: 59 | Admitting: Podiatry

## 2023-05-05 DIAGNOSIS — M79675 Pain in left toe(s): Secondary | ICD-10-CM

## 2023-05-05 DIAGNOSIS — B351 Tinea unguium: Secondary | ICD-10-CM | POA: Diagnosis not present

## 2023-05-05 DIAGNOSIS — M79674 Pain in right toe(s): Secondary | ICD-10-CM | POA: Diagnosis not present

## 2023-05-05 NOTE — Progress Notes (Signed)

## 2023-08-05 ENCOUNTER — Ambulatory Visit: Payer: 59 | Admitting: Podiatry

## 2023-08-19 ENCOUNTER — Encounter: Payer: Self-pay | Admitting: Podiatry

## 2023-08-19 ENCOUNTER — Ambulatory Visit (INDEPENDENT_AMBULATORY_CARE_PROVIDER_SITE_OTHER): Payer: 59 | Admitting: Podiatry

## 2023-08-19 DIAGNOSIS — B351 Tinea unguium: Secondary | ICD-10-CM | POA: Diagnosis not present

## 2023-08-19 DIAGNOSIS — M79675 Pain in left toe(s): Secondary | ICD-10-CM | POA: Diagnosis not present

## 2023-08-19 DIAGNOSIS — M79674 Pain in right toe(s): Secondary | ICD-10-CM | POA: Diagnosis not present

## 2023-08-19 NOTE — Progress Notes (Signed)

## 2023-08-26 ENCOUNTER — Ambulatory Visit: Payer: 59 | Admitting: Internal Medicine

## 2023-08-26 ENCOUNTER — Encounter: Payer: Self-pay | Admitting: Internal Medicine

## 2023-08-26 DIAGNOSIS — E890 Postprocedural hypothyroidism: Secondary | ICD-10-CM | POA: Diagnosis not present

## 2023-08-26 DIAGNOSIS — N2581 Secondary hyperparathyroidism of renal origin: Secondary | ICD-10-CM | POA: Diagnosis not present

## 2023-08-26 DIAGNOSIS — E210 Primary hyperparathyroidism: Secondary | ICD-10-CM | POA: Diagnosis not present

## 2023-08-26 DIAGNOSIS — E559 Vitamin D deficiency, unspecified: Secondary | ICD-10-CM | POA: Diagnosis not present

## 2023-08-26 LAB — BASIC METABOLIC PANEL
BUN: 23 mg/dL (ref 6–23)
CO2: 30 meq/L (ref 19–32)
Calcium: 10.3 mg/dL (ref 8.4–10.5)
Chloride: 105 meq/L (ref 96–112)
Creatinine, Ser: 1.42 mg/dL — ABNORMAL HIGH (ref 0.40–1.20)
GFR: 34.29 mL/min — ABNORMAL LOW (ref >60.00)
Glucose, Bld: 85 mg/dL (ref 70–99)
Potassium: 3.1 meq/L — ABNORMAL LOW (ref 3.5–5.1)
Sodium: 142 meq/L (ref 135–145)

## 2023-08-26 LAB — VITAMIN D 25 HYDROXY (VIT D DEFICIENCY, FRACTURES): VITD: 23.33 ng/mL — ABNORMAL LOW (ref 30.00–100.00)

## 2023-08-26 LAB — ALBUMIN: Albumin: 3.8 g/dL (ref 3.5–5.2)

## 2023-08-26 LAB — TSH: TSH: 2.21 u[IU]/mL (ref 0.35–5.50)

## 2023-08-26 NOTE — Progress Notes (Unsigned)
Name: [name]  MRN/ DOB: [id number], September 21, 1941    Age/ Sex: 82 y.o., female    PCP: [name], MD   Reason for Endocrinology Evaluation: Hyperparathyroidism     Date of Initial Endocrinology Evaluation: 08/26/2023     HPI: [name] is a 82 y.o. female with a past medical history of HTN, hypothyroidism. The patient presented for initial endocrinology clinic visit on 08/26/2023 for consultative assistance with her Hyperparathyroidism.   [name] has been noted with hypercalcemia intermittently since 2012, with serial max of 11.1 Mg/DL 02/980 (uncorrected ) No concomitant PTH  Patient with CKD since 2016, follows with [facility]   Has occasional constipation, and  polydipsia as well as polyuria  Has occasional joint pain, has DJD      She denies  use of over the counter calcium (including supplements, Tums, Rolaids, or other calcium containing antacids) or HCTZ,   She was on vitamin D 1000 international unit  supplements, but has been out of it for months.   She has   history of kidney stones, and kidney disease, but no liver disease, or granulomatous disease. She has  osteoporosis  but no  prior fractures  Daily dietary calcium intake: 1 servings .  She denies  family history of osteoporosis, parathyroid disease, thyroid disease.    She is S/P RAI ablation in 1983  due to hyperthyroidism . She has been on Levothyroxine since than       HISTORY:  Past Medical History:  Past Medical History:  Diagnosis Date   Allergic rhinitis    Anxiety    Back pain    Bronchiectasis    Dyslipidemia    Dysuria 10/14/2013   Fatigue 08/02/2013   GERD (gastroesophageal reflux disease)    REPORTS BECAUSE OF THIS SHE HAS TO SLEEP WITH HEAD ELEVATED    History of Graves' disease    Hx of colonic polyp    Hypercalcemia    Hyperlipidemia    Hypertension    Hypokalemia 11/15/2013   ITP (idiopathic thrombocytopenic purpura) 08/11/2013   Murmur, heart     Osteoarthritis, knee    Renal insufficiency    Shingles 2018   Thoracic aortic aneurysm (HCC)    Thrombocytopenia, unspecified (HCC) 08/02/2013   REPORTS AFTER 2012 , SHE WAS TOLD HER PLATELETS WERE VERY LOW AND WAS MADE TO STAY IN THE HOSPITAL FOR 3 DAYS WITH   Thrush 10/14/2013   Thrush, oral 09/02/2013   Thyroid disease    UTI (lower urinary tract infection) 10/14/2013   Past Surgical History:  Past Surgical History:  Procedure Laterality Date   ABDOMINAL HYSTERECTOMY     b/c fibroids   APPENDECTOMY     KNEE ARTHROPLASTY Right 10/2007   LAPAROSCOPIC CHOLECYSTECTOMY SINGLE SITE WITH INTRAOPERATIVE CHOLANGIOGRAM N/A 10/22/2018   Procedure: LAPAROSCOPIC CHOLECYSTECTOMY SINGLE SITE;  Surgeon: Karie Soda, MD;  Location: [facility];  Service: General;  Laterality: N/A;   TOTAL KNEE ARTHROPLASTY Bilateral    2008, 2012    Social History:  reports that she quit smoking about 46 years ago. Her smoking use included cigarettes. She started smoking about 56 years ago. She has a 10 pack-year smoking history. She has never used smokeless tobacco. She reports that she does not drink alcohol and does not use drugs. Family History: family history includes Breast cancer in her maternal aunt; Colon cancer in her brother; Colon polyps in her brother; Heart disease in her mother; Irritable bowel syndrome in her maternal aunt;  Kidney cancer (age of onset: 42) in her brother; Kidney disease in her mother; Liver cancer in her maternal uncle; Prostate cancer in her brother.   HOME MEDICATIONS: Allergies as of 08/26/2023       Reactions   Cauliflower [brassica Oleracea] Other (See Comments)   Showed up on allergy test   Codeine Nausea And Vomiting   Fish Allergy Nausea Only   Lactose Intolerance (gi) Diarrhea   Morphine And Codeine Nausea And Vomiting, Other (See Comments)   Hallucinations   Oxycodone Nausea And Vomiting   Tolerates hydrocodone   Penicillins Nausea Only, Other (See Comments)    REACTION: headache, tremors, Nausea Has patient had a PCN reaction causing immediate rash, facial/tongue/throat swelling, SOB or lightheadedness with hypotension: No Has patient had a PCN reaction causing severe rash involving mucus membranes or skin necrosis: No Has patient had a PCN reaction that required hospitalization No Has patient had a PCN reaction occurring within the last 10 years: No If all of the above answers are "NO", then may proceed with Cephalosporin use.   Sulfonamide Derivatives Swelling, Other (See Comments)   REACTION: mouth swells   Tramadol Hcl Other (See Comments)   REACTION: GI Intolerance   Triamcinolone Swelling, Other (See Comments)   REACTION: angio edema        Medication List        Accurate as of August 26, 2023  1:07 PM. If you have any questions, ask your nurse or doctor.          acetaminophen 500 MG tablet Commonly known as: TYLENOL Take 500 mg by mouth every 6 (six) hours as needed.   amLODipine 10 MG tablet Commonly known as: NORVASC Take 10 mg by mouth 2 (two) times daily.   atorvastatin 20 MG tablet Commonly known as: LIPITOR SMARTSIG:1 Tablet(s) By Mouth Every Evening   cephALEXin 500 MG capsule Commonly known as: KEFLEX SMARTSIG:1 Tablet(s) By Mouth Every 12 Hours   citalopram 20 MG tablet Commonly known as: CELEXA Take 20 mg by mouth daily.   clotrimazole 1 % cream Commonly known as: LOTRIMIN Apply 1 application topically daily as needed (for irritation).   ergocalciferol 1.25 MG (50000 UT) capsule Commonly known as: VITAMIN D2 Take 50,000 Units by mouth every Friday.   fluconazole 200 MG tablet Commonly known as: DIFLUCAN Take 200 mg by mouth once.   Flutter Devi Use as directed   furosemide 20 MG tablet Commonly known as: LASIX Take 20 mg by mouth daily.   HYDROcodone-acetaminophen 5-325 MG tablet Commonly known as: NORCO/VICODIN Take 1-2 tablets by mouth every 6 (six) hours as needed for moderate pain or  severe pain.   levothyroxine 125 MCG tablet Commonly known as: SYNTHROID Take 125 mcg by mouth daily before breakfast. What changed: Another medication with the same name was removed. Continue taking this medication, and follow the directions you see here. Changed by: Johnney Ou Legacie Dillingham   loratadine 10 MG tablet Commonly known as: CLARITIN TAKE 1 TABLET (10 MG TOTAL) BY MOUTH DAILY. What changed:  how much to take how to take this when to take this additional instructions   losartan 100 MG tablet Commonly known as: COZAAR Take 100 mg by mouth daily.   pantoprazole 40 MG tablet Commonly known as: PROTONIX TAKE 1 TABLET BY MOUTH DAILY   potassium chloride SA 20 MEQ tablet Commonly known as: KLOR-CON M Take 1 tablet (20 mEq total) by mouth 2 (two) times daily.   pregabalin 100 MG capsule Commonly  known as: LYRICA Take 100 mg by mouth 2 (two) times daily.   valsartan 80 MG tablet Commonly known as: DIOVAN Take 80 mg by mouth daily.          REVIEW OF SYSTEMS: A comprehensive ROS was conducted with the patient and is negative except as per HPI     OBJECTIVE:  VS: BP 128/80 (BP Location: Left Arm, Patient Position: Sitting, Cuff Size: Large)   Pulse 88   Ht 5\' 5"  (1.651 m)   Wt 210 lb (95.3 kg)   SpO2 99%   BMI 34.95 kg/m    Wt Readings from Last 3 Encounters:  08/26/23 210 lb (95.3 kg)  10/26/19 210 lb (95.3 kg)  10/22/18 198 lb 6.6 oz (90 kg)     EXAM: General: Pt appears well and is in NAD  Eyes: External eye exam normal without stare, lid lag or exophthalmos.  EOM intact.  PERRL.  Neck: General: Supple without adenopathy. Thyroid: Thyroid size normal.  No goiter or nodules appreciated. No thyroid bruit.  Lungs: Clear with good BS bilat   Heart: Auscultation: RRR.  Abdomen: Soft, nontender  Extremities:  BL LE: No pretibial edema   Mental Status: Judgment, insight: Intact Orientation: Oriented to time, place, and person Mood and affect: No  depression, anxiety, or agitation     DATA REVIEWED: ***    ASSESSMENT/PLAN/RECOMMENDATIONS:   Hypercalcemia    -Patient signed ROI to obtain most recent DXA scan through the breast center    Recommendations  Stay hydrated Avoid over-the-counter calcium tablets Consume 2-3 servings of dietary calcium daily   2. Postablative Hypothyroidism:  -Patient is clinically euthyroid -No local neck symptoms   Follow-up in 6 months  Signed electronically by: Lyndle Herrlich, MD  [facility] Endocrinology  [facility] [address], [address] [place], [place] [address] Phone: [phone] FAX: [phone]   CC: [name], MD [address] [place] [address] Phone: [phone] Fax: [phone]   Return to Endocrinology clinic as below: Future Appointments  Date Time Provider Department Center  11/25/2023  1:15 PM Helane Gunther, DPM [facility]

## 2023-08-26 NOTE — Patient Instructions (Signed)
Stay Hydrated  Avoid over the count CALCIUM tablets  Make sure you eat 2-3 servings of dietary calcium ( such as low fat milk, cheese, yogurt and green leafy vegetables )

## 2023-08-27 LAB — PARATHYROID HORMONE, INTACT (NO CA): PTH: 150 pg/mL — ABNORMAL HIGH (ref 16–77)

## 2023-08-27 LAB — CALCIUM, IONIZED: Calcium, Ion: 5.8 mg/dL — ABNORMAL HIGH (ref 4.7–5.5)

## 2023-08-28 ENCOUNTER — Telehealth: Payer: Self-pay | Admitting: Internal Medicine

## 2023-08-28 DIAGNOSIS — E559 Vitamin D deficiency, unspecified: Secondary | ICD-10-CM | POA: Insufficient documentation

## 2023-08-28 DIAGNOSIS — E210 Primary hyperparathyroidism: Secondary | ICD-10-CM | POA: Insufficient documentation

## 2023-08-28 DIAGNOSIS — N2581 Secondary hyperparathyroidism of renal origin: Secondary | ICD-10-CM | POA: Insufficient documentation

## 2023-08-28 NOTE — Telephone Encounter (Signed)
Please let the patient know that her calcium level is slightly elevated, and to make sure she stays hydrated   Kidney function is stable  Thyroid test is within normal range and to continue to take levothyroxine 125 mcg daily   Vitamin D is low, she needs to start over-the-counter vitamin D3 1000 IU daily    Thank you

## 2023-08-28 NOTE — Telephone Encounter (Signed)
Patient aware and will pick up D3

## 2023-11-25 ENCOUNTER — Ambulatory Visit: Payer: 59 | Admitting: Podiatry

## 2023-12-16 ENCOUNTER — Ambulatory Visit (INDEPENDENT_AMBULATORY_CARE_PROVIDER_SITE_OTHER): Payer: 59 | Admitting: Podiatry

## 2023-12-16 ENCOUNTER — Encounter: Payer: Self-pay | Admitting: Podiatry

## 2023-12-16 DIAGNOSIS — N1831 Chronic kidney disease, stage 3a: Secondary | ICD-10-CM

## 2023-12-16 DIAGNOSIS — B351 Tinea unguium: Secondary | ICD-10-CM

## 2023-12-16 DIAGNOSIS — M79674 Pain in right toe(s): Secondary | ICD-10-CM

## 2023-12-16 DIAGNOSIS — G629 Polyneuropathy, unspecified: Secondary | ICD-10-CM

## 2023-12-16 DIAGNOSIS — M79675 Pain in left toe(s): Secondary | ICD-10-CM | POA: Diagnosis not present

## 2023-12-16 NOTE — Progress Notes (Signed)

## 2024-02-24 ENCOUNTER — Ambulatory Visit: Payer: 59 | Admitting: Internal Medicine

## 2024-02-24 ENCOUNTER — Encounter: Payer: Self-pay | Admitting: Internal Medicine

## 2024-02-24 VITALS — BP 100/60 | HR 102 | Resp 20 | Ht 65.0 in | Wt 199.0 lb

## 2024-02-24 DIAGNOSIS — E210 Primary hyperparathyroidism: Secondary | ICD-10-CM

## 2024-02-24 DIAGNOSIS — E890 Postprocedural hypothyroidism: Secondary | ICD-10-CM | POA: Diagnosis not present

## 2024-02-24 DIAGNOSIS — N2581 Secondary hyperparathyroidism of renal origin: Secondary | ICD-10-CM | POA: Diagnosis not present

## 2024-02-24 NOTE — Progress Notes (Unsigned)
 Name: [name]  MRN/ DOB: [id number], 08-Aug-1941    Age/ Sex: 83 y.o., female    PCP: [name], MD   Reason for Endocrinology Evaluation: Hyperparathyroidism     Date of Initial Endocrinology Evaluation: 08/26/2023    HPI: [name] is a 83 y.o. female with a past medical history of HTN, hypothyroidism. The patient presented for initial endocrinology clinic visit on 08/26/2023  for consultative assistance with her Hyperparathyroidism.   [name] has been noted with hypercalcemia intermittently since 2012, with serial max of 11.1 Mg/DL 05/8294 (uncorrected ) No concomitant PTH  Patient with CKD since 2016, follows with [facility]      She denies  use of over the counter calcium (including supplements, Tums, Rolaids, or other calcium containing antacids) or HCTZ,    She has  history of kidney stones, and kidney disease, but no liver disease, or granulomatous disease. She has  osteoporosis  but no  prior fractures  Daily dietary calcium intake: 1 servings .  She denies  family history of osteoporosis, parathyroid disease, thyroid disease.   THYROID HISTORY: She is S/P RAI ablation in 1983  due to hyperthyroidism . She has been on Levothyroxine since than     SUBJECTIVE:    Today (02/24/24):  [name] is here for follow-up on hypothyroid and hyperparathyroidism.  She is accompanied by family member today  She has been drinking approximately 2 bottles of water a day, she recently had diarrhea and feels she is dehydrated, diarrhea symptoms are improving   Denies polydipsia or polyuria  Has occasional joint pain, has DJD  Denies local neck swelling  Has occasional palpitations   She follows with nephrology   Vitamin D3 was discontinues by nephrology ~ 3 weeks ago  Levothyroxine 125 mcg daily      HISTORY:  Past Medical History:  Past Medical History:  Diagnosis Date   Allergic rhinitis    Anxiety    Back pain    Bronchiectasis     Dyslipidemia    Dysuria 10/14/2013   Fatigue 08/02/2013   GERD (gastroesophageal reflux disease)    REPORTS BECAUSE OF THIS SHE HAS TO SLEEP WITH HEAD ELEVATED    History of Graves' disease    Hx of colonic polyp    Hypercalcemia    Hyperlipidemia    Hypertension    Hypokalemia 11/15/2013   ITP (idiopathic thrombocytopenic purpura) 08/11/2013   Murmur, heart    Osteoarthritis, knee    Renal insufficiency    Shingles 2018   Thoracic aortic aneurysm (HCC)    Thrombocytopenia, unspecified (HCC) 08/02/2013   REPORTS AFTER 2012 , SHE WAS TOLD HER PLATELETS WERE VERY LOW AND WAS MADE TO STAY IN THE HOSPITAL FOR 3 DAYS WITH   Thrush 10/14/2013   Thrush, oral 09/02/2013   Thyroid disease    UTI (lower urinary tract infection) 10/14/2013   Past Surgical History:  Past Surgical History:  Procedure Laterality Date   ABDOMINAL HYSTERECTOMY     b/c fibroids   APPENDECTOMY     KNEE ARTHROPLASTY Right 10/2007   LAPAROSCOPIC CHOLECYSTECTOMY SINGLE SITE WITH INTRAOPERATIVE CHOLANGIOGRAM N/A 10/22/2018   Procedure: LAPAROSCOPIC CHOLECYSTECTOMY SINGLE SITE;  Surgeon: Karie Soda, MD;  Location: [facility];  Service: General;  Laterality: N/A;   TOTAL KNEE ARTHROPLASTY Bilateral    2008, 2012    Social History:  reports that she quit smoking about 47 years ago. Her smoking use included cigarettes. She started smoking about 57 years  ago. She has a 10 pack-year smoking history. She has never used smokeless tobacco. She reports that she does not drink alcohol and does not use drugs. Family History: family history includes Breast cancer in her maternal aunt; Colon cancer in her brother; Colon polyps in her brother; Heart disease in her mother; Irritable bowel syndrome in her maternal aunt; Kidney cancer (age of onset: 39) in her brother; Kidney disease in her mother; Liver cancer in her maternal uncle; Prostate cancer in her brother.   HOME MEDICATIONS: Allergies as of 02/24/2024       Reactions    Cauliflower [brassica Oleracea] Other (See Comments)   Showed up on allergy test   Codeine Nausea And Vomiting   Fish Allergy Nausea Only   Lactose Intolerance (gi) Diarrhea   Morphine And Codeine Nausea And Vomiting, Other (See Comments)   Hallucinations   Oxycodone Nausea And Vomiting   Tolerates hydrocodone   Penicillins Nausea Only, Other (See Comments)   REACTION: headache, tremors, Nausea Has patient had a PCN reaction causing immediate rash, facial/tongue/throat swelling, SOB or lightheadedness with hypotension: No Has patient had a PCN reaction causing severe rash involving mucus membranes or skin necrosis: No Has patient had a PCN reaction that required hospitalization No Has patient had a PCN reaction occurring within the last 10 years: No If all of the above answers are "NO", then may proceed with Cephalosporin use.   Sulfonamide Derivatives Swelling, Other (See Comments)   REACTION: mouth swells   Tramadol Hcl Other (See Comments)   REACTION: GI Intolerance   Triamcinolone Swelling, Other (See Comments)   REACTION: angio edema        Medication List        Accurate as of February 24, 2024 12:32 PM. If you have any questions, ask your nurse or doctor.          acetaminophen 500 MG tablet Commonly known as: TYLENOL Take 500 mg by mouth every 6 (six) hours as needed.   amLODipine 10 MG tablet Commonly known as: NORVASC Take 10 mg by mouth 2 (two) times daily.   atorvastatin 20 MG tablet Commonly known as: LIPITOR SMARTSIG:1 Tablet(s) By Mouth Every Evening   cephALEXin 500 MG capsule Commonly known as: KEFLEX SMARTSIG:1 Tablet(s) By Mouth Every 12 Hours   citalopram 20 MG tablet Commonly known as: CELEXA Take 20 mg by mouth daily.   clotrimazole 1 % cream Commonly known as: LOTRIMIN Apply 1 application topically daily as needed (for irritation).   ergocalciferol 1.25 MG (50000 UT) capsule Commonly known as: VITAMIN D2 Take 50,000 Units by mouth  every Friday.   fluconazole 200 MG tablet Commonly known as: DIFLUCAN Take 200 mg by mouth once.   Flutter Devi Use as directed   furosemide 20 MG tablet Commonly known as: LASIX Take 20 mg by mouth daily.   HYDROcodone-acetaminophen 5-325 MG tablet Commonly known as: NORCO/VICODIN Take 1-2 tablets by mouth every 6 (six) hours as needed for moderate pain or severe pain.   levothyroxine 125 MCG tablet Commonly known as: SYNTHROID Take 125 mcg by mouth daily before breakfast.   loratadine 10 MG tablet Commonly known as: CLARITIN TAKE 1 TABLET (10 MG TOTAL) BY MOUTH DAILY. What changed:  how much to take how to take this when to take this additional instructions   losartan 100 MG tablet Commonly known as: COZAAR Take 100 mg by mouth daily.   pantoprazole 40 MG tablet Commonly known as: PROTONIX TAKE 1 TABLET BY MOUTH  DAILY   potassium chloride SA 20 MEQ tablet Commonly known as: KLOR-CON M Take 1 tablet (20 mEq total) by mouth 2 (two) times daily.   pregabalin 100 MG capsule Commonly known as: LYRICA Take 100 mg by mouth 2 (two) times daily.   valsartan 80 MG tablet Commonly known as: DIOVAN Take 80 mg by mouth daily.          REVIEW OF SYSTEMS: A comprehensive ROS was conducted with the patient and is negative except as per HPI     OBJECTIVE:  VS: BP 100/60 (BP Location: Left Arm, Patient Position: Sitting, Cuff Size: Large)   Pulse (!) 102   Resp 20   Ht 5\' 5"  (1.651 m)   Wt 199 lb (90.3 kg)   SpO2 99%   BMI 33.12 kg/m    Wt Readings from Last 3 Encounters:  02/24/24 199 lb (90.3 kg)  08/26/23 210 lb (95.3 kg)  10/26/19 210 lb (95.3 kg)     EXAM: General: Pt appears well and is in NAD  Neck: General: Supple without adenopathy. Thyroid: Thyroid size normal.  No goiter or nodules appreciated. No thyroid bruit.  Lungs: Clear with good BS bilat   Heart: Auscultation: RRR.  Abdomen: Soft, nontender  Extremities:  BL LE: No pretibial edema    Mental Status: Judgment, insight: Intact Orientation: Oriented to time, place, and person Mood and affect: No depression, anxiety, or agitation     DATA REVIEWED:    Latest Reference Range & Units 08/26/23 13:31  Sodium 135 - 145 mEq/L 142  Potassium 3.5 - 5.1 mEq/L 3.1 (L)  Chloride 96 - 112 mEq/L 105  CO2 19 - 32 mEq/L 30  Glucose 70 - 99 mg/dL 85  BUN 6 - 23 mg/dL 23  Creatinine 9.56 - 2.13 mg/dL 0.86 (H)  Calcium 8.4 - 10.5 mg/dL 57.8  Calcium Ionized 4.7 - 5.5 mg/dL 5.8 (H)  Albumin 3.5 - 5.2 g/dL 3.8  GFR >46.96 mL/min 34.29 (L)    Latest Reference Range & Units 08/26/23 13:31  VITD 30.00 - 100.00 ng/mL 23.33 (L)    Latest Reference Range & Units 08/26/23 13:31  PTH, Intact 16 - 77 pg/mL 150 (H)  TSH 0.35 - 5.50 uIU/mL 2.21   DXA 04/29/2023  Narrative & Impression   ASSESSMENT: The BMD measured at Femur Neck Left is 0.782 g/cm2 with a T-score of -1.8. This patient is considered osteopenic/low bone mass according to World Health Organization [facility]) criteria.   The quality of the exam is good. The lumbar spine was excluded due to degenerative changes. Site Region Measured Date Measured Age YA BMD Significant CHANGE T-score DualFemur Neck Left 04/29/2023 82.8 -1.8 0.782 g/cm2 * DualFemur Neck Left [birth year]08/2009 68.6 -1.2 0.871 g/cm2   DualFemur Total Mean 04/29/2023 82.8 -1.2 0.851 g/cm2   Left Forearm Radius 33% 04/29/2023 82.8 -1.6 0.733 g/cm2      ASSESSMENT/PLAN/RECOMMENDATIONS:   Hyperparathyroidism:  -This is partly primary as well as secondary due to CKD and vitamin D insufficiency -Patient is not a candidate for surgical intervention with parathyroidectomy, will treat medically if necessary - DXA showed low bone density with a T-score of -1.8 at the left femoral neck 04/2023 - Historically she has had slightly elevated ionized calcium - Per patient, her nephrologist discontinued vitamin D -   Recommendations  Stay hydrated Avoid  over-the-counter calcium tablets Consume 2-3 servings of dietary calcium daily   2. Postablative Hypothyroidism:  -Patient is clinically euthyroid -No local neck symptoms -TSH  normal, continue current dose of levothyroxine  Medication Continue levothyroxine 125 mcg daily   3.  Vitamin D insufficiency  Patient to start over-the-counter vitamin D3 1000 units daily       Follow-up in 6 months  Signed electronically by: Natale Bail, MD  [facility]  [facility] [address], [address] [place], [place] [address] Phone: [phone] FAX: [phone]   CC: Vanita Gens, MD [address] [place] [address] Phone: [phone] Fax: [phone]   Return to Endocrinology clinic as below: Future Appointments  Date Time Provider Department Center  02/24/2024  1:00 PM Lauris Keepers, Julian Obey, MD [facility] None  03/16/2024  2:15 PM Ruffin Cotton, DPM [facility]

## 2024-02-24 NOTE — Patient Instructions (Signed)
Stay Hydrated  Avoid over the count CALCIUM tablets  Make sure you eat 2-3 servings of dietary calcium ( such as low fat milk, cheese, yogurt and green leafy vegetables )

## 2024-02-25 LAB — BASIC METABOLIC PANEL WITH GFR
BUN/Creatinine Ratio: 11 (calc) (ref 6–22)
BUN: 19 mg/dL (ref 7–25)
CO2: 27 mmol/L (ref 20–32)
Calcium: 11.3 mg/dL — ABNORMAL HIGH (ref 8.6–10.4)
Chloride: 105 mmol/L (ref 98–110)
Creat: 1.78 mg/dL — ABNORMAL HIGH (ref 0.60–0.95)
Glucose, Bld: 87 mg/dL (ref 65–99)
Potassium: 3.3 mmol/L — ABNORMAL LOW (ref 3.5–5.3)
Sodium: 141 mmol/L (ref 135–146)
eGFR: 28 mL/min/{1.73_m2} — ABNORMAL LOW (ref >=60)

## 2024-02-25 LAB — TSH: TSH: 63.19 m[IU]/L — ABNORMAL HIGH (ref 0.40–4.50)

## 2024-02-25 LAB — ALBUMIN: Albumin: 4.4 g/dL (ref 3.6–5.1)

## 2024-02-25 LAB — PARATHYROID HORMONE, INTACT (NO CA): PTH: 124 pg/mL — ABNORMAL HIGH (ref 16–77)

## 2024-02-25 LAB — VITAMIN D 25 HYDROXY (VIT D DEFICIENCY, FRACTURES): Vit D, 25-Hydroxy: 33 ng/mL (ref 30–100)

## 2024-02-25 LAB — CALCIUM, IONIZED: Calcium, Ion: 6.2 mg/dL — ABNORMAL HIGH (ref 4.7–5.5)

## 2024-02-26 ENCOUNTER — Telehealth: Payer: Self-pay | Admitting: Internal Medicine

## 2024-02-26 MED ORDER — LEVOTHYROXINE SODIUM 137 MCG PO TABS
137.0000 ug | ORAL_TABLET | Freq: Every day | ORAL | 2 refills | Status: AC
Start: 2024-02-26 — End: ?

## 2024-02-26 NOTE — Telephone Encounter (Signed)
 Please let the pt know that her thyroid is WAY out of control.   Please let the pt know that not taking Levothyroxine as prescribed will result in memory issues , constipation and weight gain    I will refill her prescription at 137 mcg,   Please schedule her for a lab appointment in 2 months   Calcium remains elevated but needs to stay hydrated   Thanks

## 2024-02-26 NOTE — Telephone Encounter (Signed)
Patient notified and labs scheduled.

## 2024-03-16 ENCOUNTER — Encounter: Payer: Self-pay | Admitting: Podiatry

## 2024-03-16 ENCOUNTER — Ambulatory Visit (INDEPENDENT_AMBULATORY_CARE_PROVIDER_SITE_OTHER): Payer: 59 | Admitting: Podiatry

## 2024-03-16 DIAGNOSIS — M79675 Pain in left toe(s): Secondary | ICD-10-CM

## 2024-03-16 DIAGNOSIS — N1831 Chronic kidney disease, stage 3a: Secondary | ICD-10-CM

## 2024-03-16 DIAGNOSIS — G629 Polyneuropathy, unspecified: Secondary | ICD-10-CM

## 2024-03-16 DIAGNOSIS — B351 Tinea unguium: Secondary | ICD-10-CM

## 2024-03-16 DIAGNOSIS — M79674 Pain in right toe(s): Secondary | ICD-10-CM | POA: Diagnosis not present

## 2024-03-16 NOTE — Progress Notes (Signed)

## 2024-04-21 ENCOUNTER — Other Ambulatory Visit: Payer: Self-pay

## 2024-04-27 ENCOUNTER — Other Ambulatory Visit

## 2024-04-27 ENCOUNTER — Telehealth: Payer: Self-pay

## 2024-04-27 NOTE — Telephone Encounter (Signed)
 Per Dr. Rosalea Collin patient was advised to contact the PCP in regards to the ankle swelling to see about increasing the fluid pill.  With patient having extra thyroid  hormone and swelling getting worse indicates there is something else going on.

## 2024-04-28 ENCOUNTER — Ambulatory Visit: Payer: Self-pay | Admitting: Internal Medicine

## 2024-04-28 DIAGNOSIS — E890 Postprocedural hypothyroidism: Secondary | ICD-10-CM

## 2024-04-28 LAB — TSH: TSH: 0.11 m[IU]/L — ABNORMAL LOW (ref 0.40–4.50)

## 2024-04-28 LAB — T4, FREE: Free T4: 1.5 ng/dL (ref 0.8–1.8)

## 2024-04-28 MED ORDER — LEVOTHYROXINE SODIUM 125 MCG PO TABS: 125.0000 ug | ORAL_TABLET | Freq: Every day | ORAL | 3 refills | Status: AC

## 2024-04-28 NOTE — Telephone Encounter (Signed)
 Please let the patient know that the current dose of levothyroxine  137 is too much for her, I have switched her back to levothyroxine  125 mcg daily.  A new prescription has been sent   Please encourage the patient to use a pillbox to guarantee daily intake of levothyroxine    Please schedule for repeat labs in 2 months to make sure her thyroid  is normalizing   Thanks

## 2024-06-22 ENCOUNTER — Ambulatory Visit: Admitting: Podiatry

## 2024-07-06 ENCOUNTER — Emergency Department (HOSPITAL_COMMUNITY)

## 2024-07-06 ENCOUNTER — Encounter (HOSPITAL_COMMUNITY): Payer: Self-pay

## 2024-07-06 ENCOUNTER — Emergency Department (HOSPITAL_COMMUNITY)
Admission: EM | Admit: 2024-07-06 | Discharge: 2024-07-06 | Disposition: A | Discharge status: Home / Self Care | Attending: Emergency Medicine | Admitting: Emergency Medicine

## 2024-07-06 ENCOUNTER — Other Ambulatory Visit: Payer: Self-pay

## 2024-07-06 DIAGNOSIS — W19XXXA Unspecified fall, initial encounter: Secondary | ICD-10-CM

## 2024-07-06 DIAGNOSIS — M542 Cervicalgia: Secondary | ICD-10-CM | POA: Insufficient documentation

## 2024-07-06 DIAGNOSIS — W01198A Fall on same level from slipping, tripping and stumbling with subsequent striking against other object, initial encounter: Secondary | ICD-10-CM | POA: Diagnosis not present

## 2024-07-06 DIAGNOSIS — M25552 Pain in left hip: Secondary | ICD-10-CM | POA: Diagnosis not present

## 2024-07-06 DIAGNOSIS — R002 Palpitations: Secondary | ICD-10-CM | POA: Diagnosis not present

## 2024-07-06 DIAGNOSIS — S0990XA Unspecified injury of head, initial encounter: Secondary | ICD-10-CM | POA: Insufficient documentation

## 2024-07-06 DIAGNOSIS — E876 Hypokalemia: Secondary | ICD-10-CM | POA: Insufficient documentation

## 2024-07-06 LAB — BASIC METABOLIC PANEL WITH GFR
Anion gap: 7 (ref 5–15)
BUN: 20 mg/dL (ref 8–23)
CO2: 23 mmol/L (ref 22–32)
Calcium: 10.3 mg/dL (ref 8.9–10.3)
Chloride: 105 mmol/L (ref 98–111)
Creatinine, Ser: 1.44 mg/dL — ABNORMAL HIGH (ref 0.44–1.00)
GFR, Estimated: 36 mL/min — ABNORMAL LOW (ref >60)
Glucose, Bld: 90 mg/dL (ref 70–99)
Potassium: 3.1 mmol/L — ABNORMAL LOW (ref 3.5–5.1)
Sodium: 135 mmol/L (ref 135–145)

## 2024-07-06 LAB — MAGNESIUM: Magnesium: 1.5 mg/dL — ABNORMAL LOW (ref 1.7–2.4)

## 2024-07-06 LAB — CBC WITH DIFFERENTIAL/PLATELET
Abs Granulocyte: 2.4 K/uL (ref 1.5–6.5)
Abs Immature Granulocytes: 0.02 K/uL (ref 0.00–0.07)
Basophils Absolute: 0 K/uL (ref 0.0–0.1)
Basophils Relative: 0 %
Eosinophils Absolute: 0.1 K/uL (ref 0.0–0.5)
Eosinophils Relative: 3 %
HCT: 32.1 % — ABNORMAL LOW (ref 36.0–46.0)
Hemoglobin: 10 g/dL — ABNORMAL LOW (ref 12.0–15.0)
Immature Granulocytes: 0 %
Lymphocytes Relative: 35 %
Lymphs Abs: 1.6 K/uL (ref 0.7–4.0)
MCH: 25.1 pg — ABNORMAL LOW (ref 26.0–34.0)
MCHC: 31.2 g/dL (ref 30.0–36.0)
MCV: 80.5 fL (ref 80.0–100.0)
Monocytes Absolute: 0.5 K/uL (ref 0.1–1.0)
Monocytes Relative: 10 %
Neutro Abs: 2.4 K/uL (ref 1.7–7.7)
Neutrophils Relative %: 52 %
Platelets: 120 K/uL — ABNORMAL LOW (ref 150–400)
RBC: 3.99 MIL/uL (ref 3.87–5.11)
RDW: 17.2 % — ABNORMAL HIGH (ref 11.5–15.5)
WBC: 4.7 K/uL (ref 4.0–10.5)
nRBC: 0 % (ref 0.0–0.2)

## 2024-07-06 MED ORDER — POTASSIUM CHLORIDE CRYS ER 20 MEQ PO TBCR
40.0000 meq | EXTENDED_RELEASE_TABLET | Freq: Once | ORAL | Status: AC
  Administered 2024-07-06: 40 meq via ORAL
  Filled 2024-07-06: qty 2

## 2024-07-06 MED ORDER — MAGNESIUM OXIDE -MG SUPPLEMENT 400 (240 MG) MG PO TABS
400.0000 mg | ORAL_TABLET | Freq: Once | ORAL | Status: AC
  Administered 2024-07-06: 400 mg via ORAL
  Filled 2024-07-06: qty 1

## 2024-07-06 NOTE — ED Notes (Signed)
Pt ambulated to bedside commode with minimal assistance.  

## 2024-07-06 NOTE — ED Provider Notes (Signed)
 [place] EMERGENCY DEPARTMENT AT [facility] Provider Note   CSN: [id number] Arrival date & time: 07/06/24  1315     Patient presents with: [name]   [name] is a 83 y.o. female.   HPI 83 year old female presents after a fall.  She tripped and fell landing on her left side and hitting a door.  No LOC.  Hit the left side of her head but no significant headache.  Is having left lateral neck pain.  Also injured her left lateral hip/buttocks.  No chest pain but she was feeling palpitations after the incident.  She has had issues with palpitations for the past several weeks and has an echocardiogram scheduled.  She did not have any palpitations before the fall and the palpitations are actually now improved.  She denies any chest pain or shortness of breath at this time.  She does not take blood thinners.  No weakness or numbness.  Prior to Admission medications   Medication Sig Start Date End Date Taking? Authorizing Provider  acetaminophen  (TYLENOL ) 500 MG tablet Take 500 mg by mouth every 6 (six) hours as needed.    [provider]  amLODipine  (NORVASC ) 10 MG tablet Take 10 mg by mouth 2 (two) times daily.     [provider]  atorvastatin (LIPITOR) 20 MG tablet SMARTSIG:1 Tablet(s) By Mouth Every Evening 08/05/23   [provider]  cephALEXin  (KEFLEX ) 500 MG capsule  07/03/23   [provider]  citalopram  (CELEXA ) 20 MG tablet Take 20 mg by mouth daily.    [provider]  clotrimazole  (LOTRIMIN ) 1 % cream Apply 1 application  topically daily as needed (for irritation).    [provider]  ergocalciferol  (VITAMIN D2) 1.25 MG (50000 UT) capsule Take 50,000 Units by mouth every Friday.    [provider]  fluconazole  (DIFLUCAN ) 200 MG tablet Take 200 mg by mouth once. 07/03/23   [provider]  furosemide  (LASIX ) 20 MG tablet Take 20 mg by mouth daily. 10/18/16   [provider]   HYDROcodone -acetaminophen  (NORCO/VICODIN) 5-325 MG tablet Take 1-2 tablets by mouth every 6 (six) hours as needed for moderate pain or severe pain. 10/22/18   Sheldon Standing, MD  levothyroxine  (SYNTHROID ) 125 MCG tablet Take 1 tablet (125 mcg total) by mouth daily. 04/28/24   Shamleffer, Ibtehal Jaralla, MD  loratadine  (CLARITIN ) 10 MG tablet TAKE 1 TABLET (10 MG TOTAL) BY MOUTH DAILY. Patient taking differently: Take 10 mg by mouth daily. 02/13/17   Byrum, Robert S, MD  losartan  (COZAAR ) 100 MG tablet Take 100 mg by mouth daily.    [provider]  pantoprazole  (PROTONIX ) 40 MG tablet TAKE 1 TABLET BY MOUTH DAILY 05/27/17   Byrum, Robert S, MD  potassium chloride  SA (K-DUR,KLOR-CON ) 20 MEQ tablet Take 1 tablet (20 mEq total) by mouth 2 (two) times daily. 06/24/18   Dean Clarity, MD  pregabalin  (LYRICA ) 100 MG capsule Take 100 mg by mouth 2 (two) times daily.    [provider]  Respiratory Therapy Supplies (FLUTTER) DEVI Use as directed 01/21/18   Alaine Vicenta NOVAK, MD  valsartan (DIOVAN) 80 MG tablet Take 80 mg by mouth daily. 07/02/23   [provider]    Allergies: Cauliflower jacobo oleracea], Codeine, Fish allergy, Lactose intolerance (gi), Morphine and codeine, Oxycodone , Penicillins, Sulfonamide derivatives, Tramadol hcl, and Triamcinolone    Review of Systems  Respiratory:  Negative for shortness of breath.   Cardiovascular:  Positive for palpitations. Negative for chest  pain.  Musculoskeletal:  Positive for arthralgias and neck pain.  Neurological:  Negative for dizziness, syncope, weakness, light-headedness, numbness and headaches.    Updated Vital Signs BP (!) 152/81   Pulse 90   Temp 98.3 F (36.8 C)   Resp 17   Ht 5' 5 (1.651 m)   Wt 90 kg   SpO2 100%   BMI 33.02 kg/m   Physical Exam Vitals and nursing note reviewed.  Constitutional:      Appearance: She is well-developed.     Interventions: Cervical collar in place.  HENT:     Head:  Normocephalic and atraumatic.  Eyes:     Extraocular Movements: Extraocular movements intact.     Pupils: Pupils are equal, round, and reactive to light.  Neck:   Cardiovascular:     Rate and Rhythm: Normal rate and regular rhythm.     Pulses:          Dorsalis pedis pulses are 2+ on the left side.  Pulmonary:     Effort: Pulmonary effort is normal.     Breath sounds: Normal breath sounds.  Abdominal:     Palpations: Abdomen is soft.     Tenderness: There is no abdominal tenderness.  Musculoskeletal:     Cervical back: Muscular tenderness present. No spinous process tenderness.     Left hip: Tenderness present. Normal range of motion.       Legs:  Skin:    General: Skin is warm and dry.  Neurological:     Mental Status: She is alert.     Comments: CN 3-12 grossly intact. 5/5 strength in all 4 extremities. Grossly normal sensation.     (all labs ordered are listed, but only abnormal results are displayed) Labs Reviewed  BASIC METABOLIC PANEL WITH GFR - Abnormal; Notable for the following components:      Result Value   Potassium 3.1 (*)    Creatinine, Ser 1.44 (*)    GFR, Estimated 36 (*)    All other components within normal limits  CBC WITH DIFFERENTIAL/PLATELET - Abnormal; Notable for the following components:   Hemoglobin 10.0 (*)    HCT 32.1 (*)    MCH 25.1 (*)    RDW 17.2 (*)    Platelets 120 (*)    All other components within normal limits  MAGNESIUM  - Abnormal; Notable for the following components:   Magnesium  1.5 (*)    All other components within normal limits    EKG: EKG Interpretation Date/Time:  Tuesday July 06 2024 14:12:27 EDT Ventricular Rate:  90 PR Interval:  180 QRS Duration:  81 QT Interval:  365 QTC Calculation: 447 R Axis:   -11  Text Interpretation: Sinus rhythm Atrial premature complexes Sinus pause Left ventricular hypertrophy Inferior infarct, old Anterior infarct, old Confirmed by Freddi Hamilton [phone]) on 07/06/2024 2:59:44  PM  Radiology: DG Chest 1 View Result Date: 07/06/2024 CLINICAL DATA:  Fall today. EXAM: CHEST  1 VIEW COMPARISON:  January 21, 2018. FINDINGS: Mild cardiomegaly is noted. Right lung is clear. Minimal left basilar subsegmental atelectasis or scarring is noted. Bony thorax is unremarkable. IMPRESSION: Minimal left basilar subsegmental atelectasis or scarring. Electronically Signed   By: Lynwood Landy Raddle M.D.   On: 07/06/2024 15:33   DG Hip Unilat W or Wo Pelvis 2-3 Views Left Result Date: 07/06/2024 CLINICAL DATA:  Left hip pain after fall today. EXAM: DG HIP (WITH OR WITHOUT PELVIS) 2-3V LEFT COMPARISON:  None Available. FINDINGS: There is no evidence  of hip fracture or dislocation. Mild osteophyte formation of left hip is noted. IMPRESSION: Mild degenerative joint disease of left hip. No acute abnormality seen. Electronically Signed   By: Lynwood Landy Raddle M.D.   On: 07/06/2024 15:31     Procedures   Medications Ordered in the ED  potassium chloride  SA (KLOR-CON  M) CR tablet 40 mEq (has no administration in time range)  magnesium  oxide (MAG-OX) tablet 400 mg (has no administration in time range)                                    Medical Decision Making Amount and/or Complexity of Data Reviewed Labs: ordered.    Details: Mild hypokalemia and hypomagnesemia Radiology: ordered and independent interpretation performed.    Details: No hip fracture ECG/medicine tests: ordered and independent interpretation performed.    Details: Sinus rhythm with PAC  Risk OTC drugs. Prescription drug management.   Patient presents after what sounds like a mechanical fall.  Endorses on and off palpitations.  Initial ECG was concerning for A-fib but a repeat that looks very similar shows clear P waves.  I think this is a sinus rhythm with some PACs and occasional PVCs that I have seen on the monitor.  Will replete her potassium and magnesium  orally.  No distress.  Does not sound like this was a cardiac event  or syncope/near syncope to cause her fall.  Assuming her scans are okay she will need outpatient cardiology follow-up.  For now her CT head and C-spine are pending.  She will need to be ambulated to make sure no occult hip fracture.  Care transferred to Dr. Melvenia.     Final diagnoses:  Fall, initial encounter  Palpitations    ED Discharge Orders          Ordered    Ambulatory referral to Cardiology       Comments: If you have not heard from the Cardiology office within the next 72 hours please call [phone].   07/06/24 1644               Freddi Hamilton, MD 07/06/24 1646

## 2024-07-06 NOTE — ED Triage Notes (Signed)
 Pt to ED via EMS with c/o mechanical, ground level fall this afternoon. Pt states she tripped over something and fell, hitting head on door. Pt denies LOC. -blood thinners. Pt arrives in c-collar. Pt A&Ox4. Pt c/o pain to left anterior scalp, left shoulder, left neck, and left hip. No uncontrolled bleeding noted. Bed bugs and fleas found on pt's clothing.

## 2024-07-06 NOTE — ED Provider Notes (Signed)
 Care of patient assumed from Dr. Elige.  This patient presented to the ED after a mechanical fall, during which she struck her head on a door.  CT imaging is pending.  Discharge is anticipated. Physical Exam  BP (!) 153/67   Pulse 93   Temp 98.1 F (36.7 C) (Oral)   Resp 16   Ht 5' 5 (1.651 m)   Wt 90 kg   SpO2 97%   BMI 33.02 kg/m   Physical Exam Vitals and nursing note reviewed.  Constitutional:      General: She is not in acute distress.    Appearance: Normal appearance. She is well-developed. She is not ill-appearing, toxic-appearing or diaphoretic.  HENT:     Head: Normocephalic and atraumatic.     Right Ear: External ear normal.     Left Ear: External ear normal.     Nose: Nose normal.  Eyes:     Extraocular Movements: Extraocular movements intact.     Conjunctiva/sclera: Conjunctivae normal.  Cardiovascular:     Rate and Rhythm: Normal rate and regular rhythm.  Pulmonary:     Effort: Pulmonary effort is normal. No respiratory distress.  Abdominal:     General: There is no distension.     Palpations: Abdomen is soft.  Musculoskeletal:        General: No swelling. Normal range of motion.     Cervical back: Normal range of motion and neck supple.  Skin:    General: Skin is warm and dry.  Neurological:     General: No focal deficit present.     Mental Status: She is alert.  Psychiatric:        Mood and Affect: Mood normal.        Behavior: Behavior normal.     Procedures  Procedures  ED Course / MDM    Medical Decision Making Amount and/or Complexity of Data Reviewed Labs: ordered. Radiology: ordered.  Risk OTC drugs. Prescription drug management.   On assessment, patient resting comfortably.  On cardiac monitor, patient remained in a normal sinus rhythm.  She would have very brief sinus pauses causing mild irregularity to her rhythm.  There is no evidence of heart block.  She remained normotensive to mildly hypertensive.  CT imaging did not show  any acute findings.  Patient was discharged in stable condition.       Melvenia Motto, MD 07/06/24 2116

## 2024-07-06 NOTE — ED Notes (Signed)
 Pt's belongings/clothing double bagged. Pt cleaned and placed in gown. Precautions in place.

## 2024-07-06 NOTE — Discharge Instructions (Signed)
 Your test results today did not show any major injuries.  Take Tylenol  as needed for pain and soreness.  A cardiology referral has been ordered.  You should hear from their office in the next couple days.  Return to the emergency department for any new or worsening symptoms of concern.

## 2024-07-27 ENCOUNTER — Ambulatory Visit: Admitting: Podiatry

## 2024-08-10 ENCOUNTER — Ambulatory Visit (INDEPENDENT_AMBULATORY_CARE_PROVIDER_SITE_OTHER): Admitting: Podiatry

## 2024-08-10 ENCOUNTER — Encounter: Payer: Self-pay | Admitting: Podiatry

## 2024-08-10 DIAGNOSIS — G629 Polyneuropathy, unspecified: Secondary | ICD-10-CM

## 2024-08-10 DIAGNOSIS — B351 Tinea unguium: Secondary | ICD-10-CM | POA: Diagnosis not present

## 2024-08-10 DIAGNOSIS — M79674 Pain in right toe(s): Secondary | ICD-10-CM

## 2024-08-10 DIAGNOSIS — M79675 Pain in left toe(s): Secondary | ICD-10-CM | POA: Diagnosis not present

## 2024-08-10 DIAGNOSIS — N1831 Chronic kidney disease, stage 3a: Secondary | ICD-10-CM

## 2024-08-10 NOTE — Progress Notes (Signed)

## 2024-08-31 ENCOUNTER — Encounter: Payer: Self-pay | Admitting: Internal Medicine

## 2024-08-31 ENCOUNTER — Ambulatory Visit: Admitting: Internal Medicine

## 2024-08-31 ENCOUNTER — Other Ambulatory Visit

## 2024-08-31 VITALS — BP 122/70 | HR 78 | Ht 65.0 in | Wt 208.2 lb

## 2024-08-31 DIAGNOSIS — N2581 Secondary hyperparathyroidism of renal origin: Secondary | ICD-10-CM

## 2024-08-31 DIAGNOSIS — E210 Primary hyperparathyroidism: Secondary | ICD-10-CM

## 2024-08-31 DIAGNOSIS — E890 Postprocedural hypothyroidism: Secondary | ICD-10-CM

## 2024-08-31 NOTE — Patient Instructions (Addendum)
 Stay Hydrated  Avoid over the count CALCIUM tablets  Make sure you eat 2-3 servings of dietary calcium ( such as low fat milk, cheese, yogurt and green leafy vegetables )    Please take the x-ray order to [facility] imaging located at :  [address] [place], [place] [address]  Phone [phone]

## 2024-08-31 NOTE — Progress Notes (Unsigned)
 Name: [name]  MRN/ DOB: [id number], 1941/09/19    Age/ Sex: 83 y.o., female    PCP: Venson Candis CROME, NP   Reason for Endocrinology Evaluation: Hyperparathyroidism     Date of Initial Endocrinology Evaluation: 08/26/2023    HPI: [name] is a 83 y.o. female with a past medical history of HTN, hypothyroidism. The patient presented for initial endocrinology clinic visit on 08/26/2023  for consultative assistance with her Hyperparathyroidism.   [name] has been noted with hypercalcemia intermittently since 2012, with serial max of 11.1 Mg/DL 04/7983 (uncorrected ) No concomitant PTH  Patient with CKD since 2016, follows with [facility]      She denies  use of over the counter calcium (including supplements, Tums, Rolaids, or other calcium containing antacids) or HCTZ,    She has  history of kidney stones, and kidney disease, but no liver disease, or granulomatous disease. She has  osteoporosis  but no  prior fractures  Daily dietary calcium intake: 1 servings .  She denies  family history of osteoporosis, parathyroid  disease, thyroid  disease.   THYROID  HISTORY: She is S/P RAI ablation in 1983  due to hyperthyroidism . She has been on Levothyroxine  since than     SUBJECTIVE:    Today (08/31/24):  [name] is here for follow-up on hypothyroid and hyperparathyroidism  The patient did sustain a fall in August, 2025, no fractures She follows with nephrology - [facility]  No polydipsia  Stable urinary frequency  Has chronic constipation  Has occasional palpitations, had a follow up with cardiology  LE edema has been improving  She is staying hydrated  She is on low calcium diet   Levothyroxine  125 mcg daily      HISTORY:  Past Medical History:  Past Medical History:  Diagnosis Date   Allergic rhinitis    Anxiety    Back pain    Bronchiectasis    Dyslipidemia    Dysuria 10/14/2013   Fatigue 08/02/2013   GERD  (gastroesophageal reflux disease)    REPORTS BECAUSE OF THIS SHE HAS TO SLEEP WITH HEAD ELEVATED    History of Graves' disease    Hx of colonic polyp    Hypercalcemia    Hyperlipidemia    Hypertension    Hypokalemia 11/15/2013   ITP (idiopathic thrombocytopenic purpura) 08/11/2013   Murmur, heart    Osteoarthritis, knee    Renal insufficiency    Shingles 2018   Thoracic aortic aneurysm    Thrombocytopenia, unspecified 08/02/2013   REPORTS AFTER 2012 , SHE WAS TOLD HER PLATELETS WERE VERY LOW AND WAS MADE TO STAY IN THE HOSPITAL FOR 3 DAYS WITH   Thrush 10/14/2013   Thrush, oral 09/02/2013   Thyroid  disease    UTI (lower urinary tract infection) 10/14/2013   Past Surgical History:  Past Surgical History:  Procedure Laterality Date   ABDOMINAL HYSTERECTOMY     b/c fibroids   APPENDECTOMY     KNEE ARTHROPLASTY Right 10/2007   LAPAROSCOPIC CHOLECYSTECTOMY SINGLE SITE WITH INTRAOPERATIVE CHOLANGIOGRAM N/A 10/22/2018   Procedure: LAPAROSCOPIC CHOLECYSTECTOMY SINGLE SITE;  Surgeon: Sheldon Standing, MD;  Location: [facility];  Service: General;  Laterality: N/A;   TOTAL KNEE ARTHROPLASTY Bilateral    2008, 2012    Social History:  reports that she quit smoking about 47 years ago. Her smoking use included cigarettes. She started smoking about 57 years ago. She has a 10 pack-year smoking history. She has never used smokeless tobacco.  She reports that she does not drink alcohol and does not use drugs. Family History: family history includes Breast cancer in her maternal aunt; Colon cancer in her brother; Colon polyps in her brother; Heart disease in her mother; Irritable bowel syndrome in her maternal aunt; Kidney cancer (age of onset: 27) in her brother; Kidney disease in her mother; Liver cancer in her maternal uncle; Prostate cancer in her brother.   HOME MEDICATIONS: Allergies as of 08/31/2024       Reactions   Cauliflower [brassica Oleracea] Other (See Comments)   Showed up on allergy test    Codeine Nausea And Vomiting   Fish Allergy Nausea Only   Lactose Intolerance (gi) Diarrhea   Morphine And Codeine Nausea And Vomiting, Other (See Comments)   Hallucinations   Oxycodone  Nausea And Vomiting   Tolerates hydrocodone    Penicillins Nausea Only, Other (See Comments)   REACTION: headache, tremors, Nausea Has patient had a PCN reaction causing immediate rash, facial/tongue/throat swelling, SOB or lightheadedness with hypotension: No Has patient had a PCN reaction causing severe rash involving mucus membranes or skin necrosis: No Has patient had a PCN reaction that required hospitalization No Has patient had a PCN reaction occurring within the last 10 years: No If all of the above answers are NO, then may proceed with Cephalosporin use.   Sulfonamide Derivatives Swelling, Other (See Comments)   REACTION: mouth swells   Tramadol Hcl Other (See Comments)   REACTION: GI Intolerance   Triamcinolone Swelling, Other (See Comments)   REACTION: angio edema        Medication List        Accurate as of August 31, 2024  1:12 PM. If you have any questions, ask your nurse or doctor.          acetaminophen  500 MG tablet Commonly known as: TYLENOL  Take 500 mg by mouth every 6 (six) hours as needed.   amLODipine  10 MG tablet Commonly known as: NORVASC  Take 10 mg by mouth 2 (two) times daily.   atorvastatin 20 MG tablet Commonly known as: LIPITOR SMARTSIG:1 Tablet(s) By Mouth Every Evening   cephALEXin  500 MG capsule Commonly known as: KEFLEX    citalopram  20 MG tablet Commonly known as: CELEXA  Take 20 mg by mouth daily.   clotrimazole  1 % cream Commonly known as: LOTRIMIN  Apply 1 application  topically daily as needed (for irritation).   ergocalciferol  1.25 MG (50000 UT) capsule Commonly known as: VITAMIN D2 Take 50,000 Units by mouth every Friday.   fluconazole  200 MG tablet Commonly known as: DIFLUCAN  Take 200 mg by mouth once.   Flutter Devi Use as  directed   furosemide  20 MG tablet Commonly known as: LASIX  Take 20 mg by mouth daily.   HYDROcodone -acetaminophen  5-325 MG tablet Commonly known as: NORCO/VICODIN Take 1-2 tablets by mouth every 6 (six) hours as needed for moderate pain or severe pain.   levothyroxine  125 MCG tablet Commonly known as: SYNTHROID  Take 1 tablet (125 mcg total) by mouth daily.   loratadine  10 MG tablet Commonly known as: CLARITIN  TAKE 1 TABLET (10 MG TOTAL) BY MOUTH DAILY. What changed:  how much to take how to take this when to take this additional instructions   losartan  100 MG tablet Commonly known as: COZAAR  Take 100 mg by mouth daily.   pantoprazole  40 MG tablet Commonly known as: PROTONIX  TAKE 1 TABLET BY MOUTH DAILY   potassium chloride  SA 20 MEQ tablet Commonly known as: KLOR-CON  M Take 1 tablet (20  mEq total) by mouth 2 (two) times daily.   pregabalin  100 MG capsule Commonly known as: LYRICA  Take 100 mg by mouth 2 (two) times daily.   valsartan 80 MG tablet Commonly known as: DIOVAN Take 80 mg by mouth daily.          REVIEW OF SYSTEMS: A comprehensive ROS was conducted with the patient and is negative except as per HPI     OBJECTIVE:  VS: BP 122/70 (BP Location: Left Arm, Patient Position: Sitting, Cuff Size: Normal)   Pulse 78   Ht 5' 5 (1.651 m)   Wt 208 lb 3.2 oz (94.4 kg)   SpO2 99%   BMI 34.65 kg/m    Wt Readings from Last 3 Encounters:  08/31/24 208 lb 3.2 oz (94.4 kg)  07/06/24 198 lb 6.6 oz (90 kg)  02/24/24 199 lb (90.3 kg)     EXAM: General: Pt appears well and is in NAD  Neck: General: Supple without adenopathy. Thyroid :  No goiter or nodules appreciated.   Lungs: Clear with good BS bilat   Heart: Auscultation: RRR.  Extremities:  BL LE: trace  pretibial edema   Mental Status: Judgment, insight: Intact Orientation: Oriented to time, place, and person Mood and affect: No depression, anxiety, or agitation     DATA REVIEWED:   Latest  Reference Range & Units 08/31/24 13:21  Sodium 135 - 146 mmol/L 140  Potassium 3.5 - 5.3 mmol/L 3.2 (L)  Chloride 98 - 110 mmol/L 107  CO2 20 - 32 mmol/L 26  Glucose 65 - 99 mg/dL 82  BUN 7 - 25 mg/dL 25  Creatinine 9.39 - 9.04 mg/dL 8.43 (H)  Calcium 8.6 - 10.4 mg/dL 89.4 (H)  BUN/Creatinine Ratio 6 - 22 (calc) 16  eGFR > OR = 60 mL/min/1.60m2 33 (L)    Latest Reference Range & Units 08/31/24 13:21  Vitamin D , 25-Hydroxy 30 - 100 ng/mL 30    Latest Reference Range & Units 08/31/24 13:21  Glucose 65 - 99 mg/dL 82  PTH, Intact 16 - 77 pg/mL 135 (H)  TSH 0.40 - 4.50 mIU/L 7.81 (H)    Latest Reference Range & Units 08/31/24 13:21  Albumin  MSPROF 3.6 - 5.1 g/dL 3.9      DXA 3/81/7975  Narrative & Impression   ASSESSMENT: The BMD measured at Femur Neck Left is 0.782 g/cm2 with a T-score of -1.8. This patient is considered osteopenic/low bone mass according to World Health Organization [facility]) criteria.   The quality of the exam is good. The lumbar spine was excluded due to degenerative changes. Site Region Measured Date Measured Age YA BMD Significant CHANGE T-score DualFemur Neck Left 04/29/2023 82.8 -1.8 0.782 g/cm2 * DualFemur Neck Left [birth year]08/2009 68.6 -1.2 0.871 g/cm2   DualFemur Total Mean 04/29/2023 82.8 -1.2 0.851 g/cm2   Left Forearm Radius 33% 04/29/2023 82.8 -1.6 0.733 g/cm2      ASSESSMENT/PLAN/RECOMMENDATIONS:   Hyperparathyroidism:  -This is partly primary as well as secondary due to CKD and vitamin D  insufficiency -Patient is not a candidate for surgical intervention with parathyroidectomy, will treat medically if necessary - DXA showed low bone density with a T-score of -1.8 at the left femoral neck 04/2023 - Historically she has had slightly elevated ionized calcium - Per patient, her nephrologist discontinued vitamin D  - an order for KUB was provided to the patient as well as the address - GFR is improving, serum calcium remains elevated but stable  with elevated PTH   Recommendations  Stay  hydrated- I have asked her to increase to 3 bottles a day  Avoid over-the-counter calcium tablets Consume 2-3 servings of dietary calcium daily   2. Postablative Hypothyroidism:  -Patient is clinically euthyroid -Her TSH was 63 u IU/mL on levothyroxine  125 mcg, her TSH trended down to 0.11u IU/ML on levothyroxine  137 mcg.  I do suspect imperfect adherence with levothyroxine  125 mcg, I did switch her back to levothyroxine  125 mcg in June, 2025 -I did advise the patient to obtain a pillbox and use for levothyroxine  - TSH remains elevated, I will emphasize the importance of using a pillbox for levothyroxine  as I question imperfect adherence due to drastic change in TSH, with levothyroxine  dose change   Medication Continue levothyroxine  125 mch daily   3.  Vitamin D  insufficiency  - Per pt Vitamin D  was discontinued by her nephrologist  - Vitamin D  is normal, no change  Follow-up in 6 months  Signed electronically by: Stefano Redgie Butts, MD  [facility]  [facility] [address] [place], [place] [address] Phone: [phone] FAX: [phone]   CC: Venson Candis CROME, NP [address] [place] [address] Phone: [phone] Fax: [phone]   Return to Endocrinology clinic as below: Future Appointments  Date Time Provider Department Center  10/25/2024  3:15 PM Loreda Hacker, DPM [facility]

## 2024-09-01 LAB — BASIC METABOLIC PANEL WITH GFR
BUN/Creatinine Ratio: 16 (calc) (ref 6–22)
BUN: 25 mg/dL (ref 7–25)
CO2: 26 mmol/L (ref 20–32)
Calcium: 10.5 mg/dL — ABNORMAL HIGH (ref 8.6–10.4)
Chloride: 107 mmol/L (ref 98–110)
Creat: 1.56 mg/dL — ABNORMAL HIGH (ref 0.60–0.95)
Glucose, Bld: 82 mg/dL (ref 65–99)
Potassium: 3.2 mmol/L — ABNORMAL LOW (ref 3.5–5.3)
Sodium: 140 mmol/L (ref 135–146)
eGFR: 33 mL/min/1.73m2 — ABNORMAL LOW (ref >=60)

## 2024-09-01 LAB — PARATHYROID HORMONE, INTACT (NO CA): PTH: 135 pg/mL — ABNORMAL HIGH (ref 16–77)

## 2024-09-01 LAB — ALBUMIN: Albumin: 3.9 g/dL (ref 3.6–5.1)

## 2024-09-01 LAB — VITAMIN D 25 HYDROXY (VIT D DEFICIENCY, FRACTURES): Vit D, 25-Hydroxy: 30 ng/mL (ref 30–100)

## 2024-09-01 LAB — TSH: TSH: 7.81 m[IU]/L — ABNORMAL HIGH (ref 0.40–4.50)

## 2024-09-02 ENCOUNTER — Ambulatory Visit: Payer: Self-pay | Admitting: Internal Medicine

## 2024-10-25 ENCOUNTER — Ambulatory Visit: Admitting: Podiatry

## 2024-11-12 ENCOUNTER — Ambulatory Visit
Admission: RE | Admit: 2024-11-12 | Discharge: 2024-11-12 | Disposition: A | Discharge status: Home / Self Care | Source: Ambulatory Visit | Attending: Internal Medicine | Admitting: Internal Medicine

## 2024-11-12 DIAGNOSIS — E210 Primary hyperparathyroidism: Secondary | ICD-10-CM

## 2024-11-22 ENCOUNTER — Encounter: Payer: Self-pay | Admitting: Internal Medicine

## 2024-11-25 ENCOUNTER — Ambulatory Visit: Admitting: Podiatry

## 2024-12-16 ENCOUNTER — Ambulatory Visit: Admitting: Podiatry

## 2025-03-01 ENCOUNTER — Ambulatory Visit: Admitting: Internal Medicine
